# Patient Record
Sex: Female | Born: 1940
Health system: Southern US, Community
[De-identification: ages and names within clinical notes are randomized; demographics above are authoritative.]

## PROBLEM LIST (undated history)

## (undated) DIAGNOSIS — B0221 Postherpetic geniculate ganglionitis: Secondary | ICD-10-CM

## (undated) DIAGNOSIS — G511 Geniculate ganglionitis: Secondary | ICD-10-CM

## (undated) DIAGNOSIS — M1990 Unspecified osteoarthritis, unspecified site: Secondary | ICD-10-CM

## (undated) DIAGNOSIS — R011 Cardiac murmur, unspecified: Secondary | ICD-10-CM

## (undated) DIAGNOSIS — I639 Cerebral infarction, unspecified: Secondary | ICD-10-CM

## (undated) DIAGNOSIS — C801 Malignant (primary) neoplasm, unspecified: Secondary | ICD-10-CM

## (undated) DIAGNOSIS — I10 Essential (primary) hypertension: Secondary | ICD-10-CM

## (undated) DIAGNOSIS — E785 Hyperlipidemia, unspecified: Secondary | ICD-10-CM

## (undated) HISTORY — PX: TONSILLECTOMY: SUR1361

## (undated) HISTORY — DX: Cerebral infarction, unspecified: I63.9

## (undated) HISTORY — DX: Cardiac murmur, unspecified: R01.1

## (undated) HISTORY — DX: Hyperlipidemia, unspecified: E78.5

## (undated) HISTORY — PX: APPENDECTOMY: SHX54

## (undated) HISTORY — PX: FINGER SURGERY: SHX640

## (undated) HISTORY — PX: COLONOSCOPY: SHX174

## (undated) HISTORY — DX: Essential (primary) hypertension: I10

---

## 1963-03-20 HISTORY — PX: DILATION AND CURETTAGE OF UTERUS: SHX78

## 1972-03-19 HISTORY — PX: ABDOMINAL HYSTERECTOMY: SHX81

## 1985-03-19 DIAGNOSIS — I639 Cerebral infarction, unspecified: Secondary | ICD-10-CM

## 1985-03-19 DIAGNOSIS — D62 Acute posthemorrhagic anemia: Secondary | ICD-10-CM

## 1985-03-19 HISTORY — DX: Cerebral infarction, unspecified: I63.9

## 1998-12-12 ENCOUNTER — Other Ambulatory Visit: Admission: RE | Admit: 1998-12-12 | Discharge: 1998-12-12 | Payer: Self-pay | Admitting: Gynecology

## 2000-10-24 ENCOUNTER — Other Ambulatory Visit: Admission: RE | Admit: 2000-10-24 | Discharge: 2000-10-24 | Payer: Self-pay | Admitting: Gynecology

## 2001-12-04 ENCOUNTER — Other Ambulatory Visit: Admission: RE | Admit: 2001-12-04 | Discharge: 2001-12-04 | Payer: Self-pay | Admitting: Gynecology

## 2002-12-08 ENCOUNTER — Other Ambulatory Visit: Admission: RE | Admit: 2002-12-08 | Discharge: 2002-12-08 | Payer: Self-pay | Admitting: Gynecology

## 2003-04-01 ENCOUNTER — Emergency Department (HOSPITAL_COMMUNITY): Admission: AD | Admit: 2003-04-01 | Discharge: 2003-04-01 | Payer: Self-pay | Admitting: Family Medicine

## 2004-03-19 HISTORY — PX: EYE SURGERY: SHX253

## 2005-07-02 ENCOUNTER — Other Ambulatory Visit: Admission: RE | Admit: 2005-07-02 | Discharge: 2005-07-02 | Payer: Self-pay | Admitting: Family Medicine

## 2007-03-24 ENCOUNTER — Encounter: Admission: RE | Admit: 2007-03-24 | Discharge: 2007-03-24 | Payer: Self-pay | Admitting: Pediatrics

## 2007-07-21 ENCOUNTER — Other Ambulatory Visit: Admission: RE | Admit: 2007-07-21 | Discharge: 2007-07-21 | Payer: Self-pay | Admitting: Family Medicine

## 2008-01-21 ENCOUNTER — Ambulatory Visit (HOSPITAL_BASED_OUTPATIENT_CLINIC_OR_DEPARTMENT_OTHER): Admission: RE | Admit: 2008-01-21 | Discharge: 2008-01-21 | Payer: Self-pay | Admitting: Orthopedic Surgery

## 2008-01-21 ENCOUNTER — Encounter (INDEPENDENT_AMBULATORY_CARE_PROVIDER_SITE_OTHER): Payer: Self-pay | Admitting: Orthopedic Surgery

## 2010-08-01 NOTE — Op Note (Signed)
NAME:  Brittany Oliver, Brittany Oliver           ACCOUNT NO.:  [id number]   MEDICAL RECORD NO.:  [id number]          PATIENT TYPE:  AMB   LOCATION:  DSC                          FACILITY:  [facility]   PHYSICIAN:  Cindee Salt, M.D.       DATE OF BIRTH:  1941/02/15   DATE OF PROCEDURE:  DATE OF DISCHARGE:                               OPERATIVE REPORT   PREOPERATIVE DIAGNOSES:  Mucoid cyst, degenerative arthritis, right  middle finger, distal interphalangeal joint.   POSTOPERATIVE DIAGNOSES:  Mucoid cyst, degenerative arthritis, right  middle finger, distal interphalangeal joint.   OPERATION:  Excision mucoid cyst, debridement distal interphalangeal  joint with removal of loose body, right middle finger, DIP joint.   DATE OF OPERATION:  January 21, 2008.   SURGEON:  Cindee Salt, MD   ASSISTANT:  RN   ANESTHESIA:  Forearm based IV regional.   ANESTHESIOLOGIST:  Burna Forts, MD   HISTORY:  The patient is a 70 year old female with a history of a mass  of the distal interphalangeal joint with easily transillumination skin  with significant thinning, deformity of the nail plate, degenerative  arthritis with loose body in the distal interphalangeal joint.  She has  elected to proceed to have this surgically excised with the joint  debrided.  She is aware of risks and complications including infection,  recurrence, injury to arteries, nerves, tendons, incomplete relief of  symptoms, and dystrophy.  In the preoperative area, the patient is seen.  The extremity marked by both the patient and surgeon.  Antibiotic given.   PROCEDURE:  The patient was brought to the operating room where forearm  based IV regional anesthetic was carried out without difficulty.  She  was prepped using DuraPrep, supine position, right arm free.  A time-out  was taken.  A curvilinear incision was made over the distal  interphalangeal joint, carried down along the middle phalanx, carried  down through subcutaneous tissue.   Bleeders were electrocauterized.  A  large cyst was immediately encountered distally with a protrusion  through the tendon distally with a large exostosis proceeding through  the tendon defect.  The cyst was excised, sent to pathology.  The joint  was then opened on its radial aspect.  The loose body was removed  without difficulty.  A synovectomy performed.  Debridement of the joint  performed.  An incision was made about the exostosis penetrating through  the extensor tendon.  This was removed with a small rongeur and sent to  pathology.  No further lesions were identified.  The wound was copiously  irrigated with saline.  The skin was  then closed with interrupted 5-0 Vicryl Rapide sutures.  Sterile  compressive dressing and splint to the finger was applied.  The patient  tolerated the procedure well, was taken to the recovery room for  observation in satisfactory condition.  She will be discharged home to  return to the [facility] in [place] in 1 week on Vicodin.           ______________________________  Cindee Salt, M.D.     GK/MEDQ  D:  01/21/2008  T:  01/22/2008  Job:  [id number]

## 2010-12-20 LAB — BASIC METABOLIC PANEL
BUN: 18
CO2: 31
Calcium: 9.7
Chloride: 102
Creatinine, Ser: 0.77
GFR calc Af Amer: >60
GFR calc non Af Amer: >60
Glucose, Bld: 131 — ABNORMAL HIGH
Potassium: 4.2
Sodium: 141

## 2010-12-20 LAB — POCT HEMOGLOBIN-HEMACUE: Hemoglobin: 12.7

## 2011-03-23 DIAGNOSIS — M069 Rheumatoid arthritis, unspecified: Secondary | ICD-10-CM | POA: Diagnosis not present

## 2011-03-23 DIAGNOSIS — T887XXA Unspecified adverse effect of drug or medicament, initial encounter: Secondary | ICD-10-CM | POA: Diagnosis not present

## 2011-03-23 DIAGNOSIS — Z79899 Other long term (current) drug therapy: Secondary | ICD-10-CM | POA: Diagnosis not present

## 2011-04-30 DIAGNOSIS — T887XXA Unspecified adverse effect of drug or medicament, initial encounter: Secondary | ICD-10-CM | POA: Diagnosis not present

## 2011-04-30 DIAGNOSIS — M069 Rheumatoid arthritis, unspecified: Secondary | ICD-10-CM | POA: Diagnosis not present

## 2011-04-30 DIAGNOSIS — Z79899 Other long term (current) drug therapy: Secondary | ICD-10-CM | POA: Diagnosis not present

## 2011-05-09 DIAGNOSIS — Z961 Presence of intraocular lens: Secondary | ICD-10-CM | POA: Diagnosis not present

## 2011-05-09 DIAGNOSIS — H35439 Paving stone degeneration of retina, unspecified eye: Secondary | ICD-10-CM | POA: Diagnosis not present

## 2011-05-10 DIAGNOSIS — L570 Actinic keratosis: Secondary | ICD-10-CM | POA: Diagnosis not present

## 2011-05-10 DIAGNOSIS — L821 Other seborrheic keratosis: Secondary | ICD-10-CM | POA: Diagnosis not present

## 2011-06-14 DIAGNOSIS — Z961 Presence of intraocular lens: Secondary | ICD-10-CM | POA: Diagnosis not present

## 2011-06-14 DIAGNOSIS — M069 Rheumatoid arthritis, unspecified: Secondary | ICD-10-CM | POA: Diagnosis not present

## 2011-06-14 DIAGNOSIS — T887XXA Unspecified adverse effect of drug or medicament, initial encounter: Secondary | ICD-10-CM | POA: Diagnosis not present

## 2011-06-14 DIAGNOSIS — Z79899 Other long term (current) drug therapy: Secondary | ICD-10-CM | POA: Diagnosis not present

## 2011-06-14 DIAGNOSIS — H35349 Macular cyst, hole, or pseudohole, unspecified eye: Secondary | ICD-10-CM | POA: Diagnosis not present

## 2011-06-14 DIAGNOSIS — H5315 Visual distortions of shape and size: Secondary | ICD-10-CM | POA: Diagnosis not present

## 2011-06-26 DIAGNOSIS — H43819 Vitreous degeneration, unspecified eye: Secondary | ICD-10-CM | POA: Diagnosis not present

## 2011-06-26 DIAGNOSIS — H35349 Macular cyst, hole, or pseudohole, unspecified eye: Secondary | ICD-10-CM | POA: Diagnosis not present

## 2011-07-03 DIAGNOSIS — H35349 Macular cyst, hole, or pseudohole, unspecified eye: Secondary | ICD-10-CM | POA: Diagnosis not present

## 2011-08-01 DIAGNOSIS — M069 Rheumatoid arthritis, unspecified: Secondary | ICD-10-CM | POA: Diagnosis not present

## 2011-08-01 DIAGNOSIS — Z79899 Other long term (current) drug therapy: Secondary | ICD-10-CM | POA: Diagnosis not present

## 2011-08-01 DIAGNOSIS — T887XXA Unspecified adverse effect of drug or medicament, initial encounter: Secondary | ICD-10-CM | POA: Diagnosis not present

## 2011-08-06 DIAGNOSIS — Z1331 Encounter for screening for depression: Secondary | ICD-10-CM | POA: Diagnosis not present

## 2011-08-06 DIAGNOSIS — I10 Essential (primary) hypertension: Secondary | ICD-10-CM | POA: Diagnosis not present

## 2011-08-06 DIAGNOSIS — E785 Hyperlipidemia, unspecified: Secondary | ICD-10-CM | POA: Diagnosis not present

## 2011-08-06 DIAGNOSIS — M76899 Other specified enthesopathies of unspecified lower limb, excluding foot: Secondary | ICD-10-CM | POA: Diagnosis not present

## 2011-08-06 DIAGNOSIS — G500 Trigeminal neuralgia: Secondary | ICD-10-CM | POA: Diagnosis not present

## 2011-08-06 DIAGNOSIS — R7301 Impaired fasting glucose: Secondary | ICD-10-CM | POA: Diagnosis not present

## 2011-08-20 DIAGNOSIS — H9190 Unspecified hearing loss, unspecified ear: Secondary | ICD-10-CM | POA: Diagnosis not present

## 2011-08-20 DIAGNOSIS — M76899 Other specified enthesopathies of unspecified lower limb, excluding foot: Secondary | ICD-10-CM | POA: Diagnosis not present

## 2011-08-20 DIAGNOSIS — H6120 Impacted cerumen, unspecified ear: Secondary | ICD-10-CM | POA: Diagnosis not present

## 2011-08-29 DIAGNOSIS — M810 Age-related osteoporosis without current pathological fracture: Secondary | ICD-10-CM | POA: Diagnosis not present

## 2011-08-29 DIAGNOSIS — Z79899 Other long term (current) drug therapy: Secondary | ICD-10-CM | POA: Diagnosis not present

## 2011-08-29 DIAGNOSIS — M069 Rheumatoid arthritis, unspecified: Secondary | ICD-10-CM | POA: Diagnosis not present

## 2011-08-29 DIAGNOSIS — E782 Mixed hyperlipidemia: Secondary | ICD-10-CM | POA: Diagnosis not present

## 2011-09-12 DIAGNOSIS — T887XXA Unspecified adverse effect of drug or medicament, initial encounter: Secondary | ICD-10-CM | POA: Diagnosis not present

## 2011-09-12 DIAGNOSIS — Z79899 Other long term (current) drug therapy: Secondary | ICD-10-CM | POA: Diagnosis not present

## 2011-09-12 DIAGNOSIS — M069 Rheumatoid arthritis, unspecified: Secondary | ICD-10-CM | POA: Diagnosis not present

## 2011-09-13 DIAGNOSIS — H35349 Macular cyst, hole, or pseudohole, unspecified eye: Secondary | ICD-10-CM | POA: Diagnosis not present

## 2011-09-18 DIAGNOSIS — Z1231 Encounter for screening mammogram for malignant neoplasm of breast: Secondary | ICD-10-CM | POA: Diagnosis not present

## 2011-09-28 DIAGNOSIS — M76899 Other specified enthesopathies of unspecified lower limb, excluding foot: Secondary | ICD-10-CM | POA: Diagnosis not present

## 2011-10-24 DIAGNOSIS — M76899 Other specified enthesopathies of unspecified lower limb, excluding foot: Secondary | ICD-10-CM | POA: Diagnosis not present

## 2011-11-02 DIAGNOSIS — T887XXA Unspecified adverse effect of drug or medicament, initial encounter: Secondary | ICD-10-CM | POA: Diagnosis not present

## 2011-11-02 DIAGNOSIS — M069 Rheumatoid arthritis, unspecified: Secondary | ICD-10-CM | POA: Diagnosis not present

## 2011-11-02 DIAGNOSIS — Z79899 Other long term (current) drug therapy: Secondary | ICD-10-CM | POA: Diagnosis not present

## 2011-11-14 DIAGNOSIS — M25559 Pain in unspecified hip: Secondary | ICD-10-CM | POA: Diagnosis not present

## 2011-11-14 DIAGNOSIS — M76899 Other specified enthesopathies of unspecified lower limb, excluding foot: Secondary | ICD-10-CM | POA: Diagnosis not present

## 2011-11-22 DIAGNOSIS — M25559 Pain in unspecified hip: Secondary | ICD-10-CM | POA: Diagnosis not present

## 2011-11-29 DIAGNOSIS — M25559 Pain in unspecified hip: Secondary | ICD-10-CM | POA: Diagnosis not present

## 2011-12-13 DIAGNOSIS — T887XXA Unspecified adverse effect of drug or medicament, initial encounter: Secondary | ICD-10-CM | POA: Diagnosis not present

## 2011-12-13 DIAGNOSIS — M069 Rheumatoid arthritis, unspecified: Secondary | ICD-10-CM | POA: Diagnosis not present

## 2011-12-13 DIAGNOSIS — Z79899 Other long term (current) drug therapy: Secondary | ICD-10-CM | POA: Diagnosis not present

## 2011-12-20 DIAGNOSIS — H35379 Puckering of macula, unspecified eye: Secondary | ICD-10-CM | POA: Diagnosis not present

## 2012-01-24 DIAGNOSIS — Z79899 Other long term (current) drug therapy: Secondary | ICD-10-CM | POA: Diagnosis not present

## 2012-01-24 DIAGNOSIS — M069 Rheumatoid arthritis, unspecified: Secondary | ICD-10-CM | POA: Diagnosis not present

## 2012-01-24 DIAGNOSIS — T887XXA Unspecified adverse effect of drug or medicament, initial encounter: Secondary | ICD-10-CM | POA: Diagnosis not present

## 2012-02-05 DIAGNOSIS — E785 Hyperlipidemia, unspecified: Secondary | ICD-10-CM | POA: Diagnosis not present

## 2012-02-05 DIAGNOSIS — Z23 Encounter for immunization: Secondary | ICD-10-CM | POA: Diagnosis not present

## 2012-02-05 DIAGNOSIS — M5137 Other intervertebral disc degeneration, lumbosacral region: Secondary | ICD-10-CM | POA: Diagnosis not present

## 2012-02-05 DIAGNOSIS — I10 Essential (primary) hypertension: Secondary | ICD-10-CM | POA: Diagnosis not present

## 2012-02-12 DIAGNOSIS — M545 Low back pain, unspecified: Secondary | ICD-10-CM | POA: Diagnosis not present

## 2012-02-21 DIAGNOSIS — M545 Low back pain, unspecified: Secondary | ICD-10-CM | POA: Diagnosis not present

## 2012-02-26 DIAGNOSIS — M545 Low back pain, unspecified: Secondary | ICD-10-CM | POA: Diagnosis not present

## 2012-02-27 DIAGNOSIS — M069 Rheumatoid arthritis, unspecified: Secondary | ICD-10-CM | POA: Diagnosis not present

## 2012-02-27 DIAGNOSIS — Z79899 Other long term (current) drug therapy: Secondary | ICD-10-CM | POA: Diagnosis not present

## 2012-02-27 DIAGNOSIS — I10 Essential (primary) hypertension: Secondary | ICD-10-CM | POA: Diagnosis not present

## 2012-02-27 DIAGNOSIS — E782 Mixed hyperlipidemia: Secondary | ICD-10-CM | POA: Diagnosis not present

## 2012-02-28 DIAGNOSIS — M545 Low back pain, unspecified: Secondary | ICD-10-CM | POA: Diagnosis not present

## 2012-03-03 DIAGNOSIS — M545 Low back pain, unspecified: Secondary | ICD-10-CM | POA: Diagnosis not present

## 2012-03-05 DIAGNOSIS — T887XXA Unspecified adverse effect of drug or medicament, initial encounter: Secondary | ICD-10-CM | POA: Diagnosis not present

## 2012-03-05 DIAGNOSIS — M069 Rheumatoid arthritis, unspecified: Secondary | ICD-10-CM | POA: Diagnosis not present

## 2012-03-05 DIAGNOSIS — Z79899 Other long term (current) drug therapy: Secondary | ICD-10-CM | POA: Diagnosis not present

## 2012-03-06 DIAGNOSIS — M545 Low back pain, unspecified: Secondary | ICD-10-CM | POA: Diagnosis not present

## 2012-03-10 DIAGNOSIS — M545 Low back pain, unspecified: Secondary | ICD-10-CM | POA: Diagnosis not present

## 2012-04-30 DIAGNOSIS — Z79899 Other long term (current) drug therapy: Secondary | ICD-10-CM | POA: Diagnosis not present

## 2012-04-30 DIAGNOSIS — T887XXA Unspecified adverse effect of drug or medicament, initial encounter: Secondary | ICD-10-CM | POA: Diagnosis not present

## 2012-04-30 DIAGNOSIS — M069 Rheumatoid arthritis, unspecified: Secondary | ICD-10-CM | POA: Diagnosis not present

## 2012-06-04 DIAGNOSIS — T887XXA Unspecified adverse effect of drug or medicament, initial encounter: Secondary | ICD-10-CM | POA: Diagnosis not present

## 2012-06-04 DIAGNOSIS — M069 Rheumatoid arthritis, unspecified: Secondary | ICD-10-CM | POA: Diagnosis not present

## 2012-06-04 DIAGNOSIS — Z79899 Other long term (current) drug therapy: Secondary | ICD-10-CM | POA: Diagnosis not present

## 2012-06-19 DIAGNOSIS — H5315 Visual distortions of shape and size: Secondary | ICD-10-CM | POA: Diagnosis not present

## 2012-06-19 DIAGNOSIS — H2640 Unspecified secondary cataract: Secondary | ICD-10-CM | POA: Diagnosis not present

## 2012-06-19 DIAGNOSIS — H5310 Unspecified subjective visual disturbances: Secondary | ICD-10-CM | POA: Diagnosis not present

## 2012-06-19 DIAGNOSIS — H35379 Puckering of macula, unspecified eye: Secondary | ICD-10-CM | POA: Diagnosis not present

## 2012-07-03 DIAGNOSIS — H26499 Other secondary cataract, unspecified eye: Secondary | ICD-10-CM | POA: Diagnosis not present

## 2012-07-03 DIAGNOSIS — H2640 Unspecified secondary cataract: Secondary | ICD-10-CM | POA: Diagnosis not present

## 2012-07-16 DIAGNOSIS — Z79899 Other long term (current) drug therapy: Secondary | ICD-10-CM | POA: Diagnosis not present

## 2012-07-16 DIAGNOSIS — T887XXA Unspecified adverse effect of drug or medicament, initial encounter: Secondary | ICD-10-CM | POA: Diagnosis not present

## 2012-07-16 DIAGNOSIS — M069 Rheumatoid arthritis, unspecified: Secondary | ICD-10-CM | POA: Diagnosis not present

## 2012-08-25 DIAGNOSIS — I10 Essential (primary) hypertension: Secondary | ICD-10-CM | POA: Diagnosis not present

## 2012-08-25 DIAGNOSIS — IMO0002 Reserved for concepts with insufficient information to code with codable children: Secondary | ICD-10-CM | POA: Diagnosis not present

## 2012-08-25 DIAGNOSIS — E785 Hyperlipidemia, unspecified: Secondary | ICD-10-CM | POA: Diagnosis not present

## 2012-08-25 DIAGNOSIS — M545 Low back pain, unspecified: Secondary | ICD-10-CM | POA: Diagnosis not present

## 2012-08-25 DIAGNOSIS — R7301 Impaired fasting glucose: Secondary | ICD-10-CM | POA: Diagnosis not present

## 2012-08-25 DIAGNOSIS — Z1331 Encounter for screening for depression: Secondary | ICD-10-CM | POA: Diagnosis not present

## 2012-08-27 DIAGNOSIS — M810 Age-related osteoporosis without current pathological fracture: Secondary | ICD-10-CM | POA: Diagnosis not present

## 2012-08-27 DIAGNOSIS — I10 Essential (primary) hypertension: Secondary | ICD-10-CM | POA: Diagnosis not present

## 2012-08-27 DIAGNOSIS — M069 Rheumatoid arthritis, unspecified: Secondary | ICD-10-CM | POA: Diagnosis not present

## 2012-08-27 DIAGNOSIS — M2540 Effusion, unspecified joint: Secondary | ICD-10-CM | POA: Diagnosis not present

## 2012-08-27 DIAGNOSIS — E782 Mixed hyperlipidemia: Secondary | ICD-10-CM | POA: Diagnosis not present

## 2012-08-27 DIAGNOSIS — T887XXA Unspecified adverse effect of drug or medicament, initial encounter: Secondary | ICD-10-CM | POA: Diagnosis not present

## 2012-08-27 DIAGNOSIS — Z79899 Other long term (current) drug therapy: Secondary | ICD-10-CM | POA: Diagnosis not present

## 2012-09-24 DIAGNOSIS — Z1231 Encounter for screening mammogram for malignant neoplasm of breast: Secondary | ICD-10-CM | POA: Diagnosis not present

## 2012-09-24 DIAGNOSIS — Z780 Asymptomatic menopausal state: Secondary | ICD-10-CM | POA: Diagnosis not present

## 2012-10-08 DIAGNOSIS — T887XXA Unspecified adverse effect of drug or medicament, initial encounter: Secondary | ICD-10-CM | POA: Diagnosis not present

## 2012-10-08 DIAGNOSIS — M069 Rheumatoid arthritis, unspecified: Secondary | ICD-10-CM | POA: Diagnosis not present

## 2012-10-08 DIAGNOSIS — Z79899 Other long term (current) drug therapy: Secondary | ICD-10-CM | POA: Diagnosis not present

## 2012-11-20 DIAGNOSIS — M069 Rheumatoid arthritis, unspecified: Secondary | ICD-10-CM | POA: Diagnosis not present

## 2012-11-20 DIAGNOSIS — T887XXA Unspecified adverse effect of drug or medicament, initial encounter: Secondary | ICD-10-CM | POA: Diagnosis not present

## 2012-11-20 DIAGNOSIS — Z79899 Other long term (current) drug therapy: Secondary | ICD-10-CM | POA: Diagnosis not present

## 2012-12-26 DIAGNOSIS — Z23 Encounter for immunization: Secondary | ICD-10-CM | POA: Diagnosis not present

## 2012-12-31 DIAGNOSIS — Z79899 Other long term (current) drug therapy: Secondary | ICD-10-CM | POA: Diagnosis not present

## 2012-12-31 DIAGNOSIS — T887XXA Unspecified adverse effect of drug or medicament, initial encounter: Secondary | ICD-10-CM | POA: Diagnosis not present

## 2012-12-31 DIAGNOSIS — M069 Rheumatoid arthritis, unspecified: Secondary | ICD-10-CM | POA: Diagnosis not present

## 2013-02-16 DIAGNOSIS — C801 Malignant (primary) neoplasm, unspecified: Secondary | ICD-10-CM

## 2013-02-16 HISTORY — DX: Malignant (primary) neoplasm, unspecified: C80.1

## 2013-02-18 DIAGNOSIS — M069 Rheumatoid arthritis, unspecified: Secondary | ICD-10-CM | POA: Diagnosis not present

## 2013-02-18 DIAGNOSIS — T887XXA Unspecified adverse effect of drug or medicament, initial encounter: Secondary | ICD-10-CM | POA: Diagnosis not present

## 2013-02-18 DIAGNOSIS — Z79899 Other long term (current) drug therapy: Secondary | ICD-10-CM | POA: Diagnosis not present

## 2013-02-20 DIAGNOSIS — I10 Essential (primary) hypertension: Secondary | ICD-10-CM | POA: Diagnosis not present

## 2013-02-20 DIAGNOSIS — E785 Hyperlipidemia, unspecified: Secondary | ICD-10-CM | POA: Diagnosis not present

## 2013-02-20 DIAGNOSIS — R7309 Other abnormal glucose: Secondary | ICD-10-CM | POA: Diagnosis not present

## 2013-02-20 DIAGNOSIS — G500 Trigeminal neuralgia: Secondary | ICD-10-CM | POA: Diagnosis not present

## 2013-02-26 DIAGNOSIS — D485 Neoplasm of uncertain behavior of skin: Secondary | ICD-10-CM | POA: Diagnosis not present

## 2013-02-26 DIAGNOSIS — L218 Other seborrheic dermatitis: Secondary | ICD-10-CM | POA: Diagnosis not present

## 2013-02-26 DIAGNOSIS — C4360 Malignant melanoma of unspecified upper limb, including shoulder: Secondary | ICD-10-CM | POA: Diagnosis not present

## 2013-02-26 DIAGNOSIS — L570 Actinic keratosis: Secondary | ICD-10-CM | POA: Diagnosis not present

## 2013-03-23 ENCOUNTER — Encounter (INDEPENDENT_AMBULATORY_CARE_PROVIDER_SITE_OTHER): Payer: Self-pay | Admitting: General Surgery

## 2013-03-23 ENCOUNTER — Encounter (HOSPITAL_COMMUNITY): Payer: Self-pay | Admitting: Respiratory Therapy

## 2013-03-23 ENCOUNTER — Ambulatory Visit (INDEPENDENT_AMBULATORY_CARE_PROVIDER_SITE_OTHER): Payer: Medicare Other | Admitting: General Surgery

## 2013-03-23 VITALS — BP 120/80 | HR 78 | Temp 98.6°F | Resp 14 | Ht 64.0 in | Wt 132.4 lb

## 2013-03-23 DIAGNOSIS — C4360 Malignant melanoma of unspecified upper limb, including shoulder: Secondary | ICD-10-CM

## 2013-03-23 DIAGNOSIS — C4362 Malignant melanoma of left upper limb, including shoulder: Secondary | ICD-10-CM | POA: Insufficient documentation

## 2013-03-23 NOTE — Progress Notes (Signed)
Patient ID: Brittany Oliver, female   DOB: May 23, 1940, 73 y.o.   MRN: [id number]  Chief Complaint  Patient presents with  . Mass    HPI Brittany Oliver is a 73 y.o. female.  Chief complaint: Melanoma left upper arm HPI Patient was evaluated by dermatology and skin surgery Center. Dr. Willaim Sheng performed a shave biopsy on her left upper arm on the medial aspect near her axilla. Pathology returned malignant melanoma, 0.79 mm in thickness. There was no satellitosis, ulceration was focally present. The margin was negative, however, in situ component extended to a lateral margin. This is consistent with a pT1b lesion. I was asked to see her in consultation by Dr. Willaim Sheng regarding further treatment. The patient has no significant complaints from the biopsy site. Her daughter is a Sales promotion account executive who works at [employer]. Past Medical History  Diagnosis Date  . Hypertension   . Heart murmur   . Hyperlipidemia   . Stroke     Past Surgical History  Procedure Laterality Date  . Abdominal hysterectomy      History reviewed. No pertinent family history.  Social History History  Substance Use Topics  . Smoking status: Former Smoker    Quit date: 03/23/2005  . Smokeless tobacco: Not on file  . Alcohol Use: 1.2 oz/week    2 Glasses of wine per week    No Known Allergies  Current Outpatient Prescriptions  Medication Sig Dispense Refill  . amoxicillin (AMOXIL) 500 MG capsule       . Betamethasone Valerate 0.12 % foam       . gabapentin (NEURONTIN) 800 MG tablet       . losartan-hydrochlorothiazide (HYZAAR) 100-25 MG per tablet       . pravastatin (PRAVACHOL) 80 MG tablet       . vitamin C (ASCORBIC ACID) 500 MG tablet Take 500 mg by mouth daily.       No current facility-administered medications for this visit.    Review of Systems Review of Systems  Constitutional: Negative for fever, chills and unexpected weight change.  HENT: Negative for congestion, hearing loss, sore  throat, trouble swallowing and voice change.        Chronic left facial neuralgia  Eyes: Negative for visual disturbance.  Respiratory: Negative for cough and wheezing.   Cardiovascular: Negative for chest pain, palpitations and leg swelling.  Gastrointestinal: Negative for nausea, vomiting, abdominal pain, diarrhea, constipation, blood in stool, abdominal distention and anal bleeding.  Genitourinary: Negative for hematuria, vaginal bleeding and difficulty urinating.  Musculoskeletal: Negative for arthralgias.  Skin: Positive for wound. Negative for rash.       Status post biopsy left upper arm  Neurological: Negative for seizures, syncope and headaches.       See above  Hematological: Negative for adenopathy. Does not bruise/bleed easily.  Psychiatric/Behavioral: Negative for confusion.    Blood pressure 120/80, pulse 78, temperature 98.6 F (37 C), temperature source Temporal, resp. rate 14, height 5\' 4"  (1.626 m), weight 132 lb 6.4 oz (60.056 kg).  Physical Exam Physical Exam  Constitutional: She is oriented to person, place, and time. She appears well-developed and well-nourished. No distress.  HENT:  Head: Normocephalic.  Mouth/Throat: Oropharynx is clear and moist.  Eyes: Conjunctivae and EOM are normal. Pupils are equal, round, and reactive to light. Right eye exhibits no discharge. Left eye exhibits no discharge.  Neck: Normal range of motion. No tracheal deviation present. No thyromegaly present.  Cardiovascular: Normal rate, regular rhythm, normal  heart sounds and intact distal pulses.   Pulmonary/Chest: Effort normal and breath sounds normal. No stridor. No respiratory distress. She has no wheezes. She has no rales.  Abdominal: Soft. Bowel sounds are normal. She exhibits no distension. There is no tenderness.  Musculoskeletal: She exhibits no edema and no tenderness.       Arms: Healing biopsy site left upper inner arm with no surrounding nodularity, no signs of infection    Neurological: She is alert and oriented to person, place, and time.  Skin:  See Musculoskeletal section    Data Reviewed Pathology as described above  Assessment    Melanoma left upper inner arm.    Plan    In light of the thickness, I recommend wide excision as well as sentinel lymph node biopsy. We discussed the risks and benefits as well as some general possibilities of pathologic outcome. We'll plan oncologic referral after surgery as well, and her daughter is a patient of Dr. Marin Olp who [place] would like to see. I look forward to schediling  this in the very near future as an outpatient procedure. Their  questions were answered.       Anahita Cua E 03/23/2013, 1:19 PM

## 2013-03-24 DIAGNOSIS — Z0289 Encounter for other administrative examinations: Secondary | ICD-10-CM

## 2013-03-25 ENCOUNTER — Encounter: Payer: Self-pay | Admitting: Neurology

## 2013-03-25 ENCOUNTER — Ambulatory Visit (INDEPENDENT_AMBULATORY_CARE_PROVIDER_SITE_OTHER): Payer: Medicare Other | Admitting: Neurology

## 2013-03-25 VITALS — BP 124/90 | HR 80 | Resp 14 | Ht 63.5 in | Wt 135.0 lb

## 2013-03-25 DIAGNOSIS — G500 Trigeminal neuralgia: Secondary | ICD-10-CM

## 2013-03-25 DIAGNOSIS — G501 Atypical facial pain: Secondary | ICD-10-CM | POA: Diagnosis not present

## 2013-03-25 MED ORDER — OXCARBAZEPINE 150 MG PO TABS: 150.0000 mg | ORAL_TABLET | Freq: Two times a day (BID) | ORAL | Status: DC

## 2013-03-25 NOTE — Patient Instructions (Addendum)
It is an atypical presentation of trigeminal neuralgia, but it certainly is a neuralgia. 1.  I would like to start Trileptal (oxcarbazepine) 150mg  pills.  Take 1 pill twice daily (1 in morning and 1 at bedtime).  Side effects may cause sleepiness or dizziness.  Call in 4 weeks with an update.  If you are feeling better, then we will refill the medication.  Otherwise, we will increase the dose. 2.  Continue the gabapentin at current dose in the meantime. 3.  Follow up in 3 months.

## 2013-03-25 NOTE — Progress Notes (Signed)
NEUROLOGY CONSULTATION NOTE  Brittany Oliver MRN: [id number] DOB: December 18, 1940  Referring provider: Dr. Dema Severin  Primary care provider: Dr. Dema Severin  Reason for consult:  Trigeminal neuralgia  HISTORY OF PRESENT ILLNESS: Brittany Oliver is a 73 year old right-handed woman with history of posterior circulation stroke, hypertension, and hypercholesterolemia who presents for trigeminal neuralgia.  Records and images were personally reviewed where available.    She began experiencing left-sided facial pain in 2006.  She reports a fairly constant burning and pins and needles sensation in the left V1 and V2 distribution.  Light touch, cold and wind can trigger a shooting pain down the face, but there is no spontaneous paroxysmal shooting pain. Eating and brushing her teeth does not aggravate it.  She says heat seems to help relieve it.  She was eventually diagnosed with left trigeminal neuralgia in 2012.  Current Medication:  gabapentin 800mg  TID (helps but still lingering pain) Past medications:  none Other therapy:  Gamma Knife x2 (ineffective).  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  She does have a history of brainstem stroke in 1987, in which she presented with left oculomotor weakness, left rotatory nystagmus, left facial numbness and dysfunction of her right arm and leg.  She still has residual numbness of the left face.  08/25/12 LABS:  Na 138, K 3.8, Cl 97, GLUC 104, BUN 22, Cr 0.84, Ca 9,8, AP 50, AST 17, ALT 17, Hgb A1c 5.8.  PAST MEDICAL HISTORY: Past Medical History  Diagnosis Date  . Hypertension   . Heart murmur   . Hyperlipidemia   . Stroke     PAST SURGICAL HISTORY: Past Surgical History  Procedure Laterality Date  . Abdominal hysterectomy  1974    MEDICATIONS: Current Outpatient  Prescriptions on File Prior to Visit  Medication Sig Dispense Refill  . amoxicillin (AMOXIL) 500 MG capsule Take 500 mg by mouth 4 (four) times daily.      . Betamethasone Valerate 0.12 % foam Apply 1 application topically daily.       . diclofenac (FLECTOR) 1.3 % PTCH Place 1 patch onto the skin daily as needed (for pain).      Marland Kitchen gabapentin (NEURONTIN) 800 MG tablet Take 800 mg by mouth 3 (three) times daily.      Marland Kitchen losartan-hydrochlorothiazide (HYZAAR) 100-25 MG per tablet Take 1 tablet by mouth daily.      . pravastatin (PRAVACHOL) 80 MG tablet Take 80 mg by mouth daily.      . vitamin C (ASCORBIC ACID) 500 MG tablet Take 500 mg by mouth daily.       No current facility-administered medications on file prior to visit.    ALLERGIES: No Known Allergies  FAMILY HISTORY: Family History  Problem Relation Age of Onset  . Adopted: Yes    SOCIAL HISTORY: History   Social History  . Marital Status: Married    Spouse Name: N/A    Number of Children: N/A  . Years of Education: N/A   Occupational History  . Not on file.   Social History Main Topics  . Smoking status: Former Smoker    Quit date: 03/23/2005  . Smokeless tobacco: Not on file  . Alcohol Use: 1.2 oz/week    2 Glasses of wine per week  . Drug Use: No  . Sexual Activity: Not on file  Other Topics Concern  . Not on file   Social History Narrative  . No narrative on file    REVIEW OF SYSTEMS: Constitutional: No fevers, chills, or sweats, no generalized fatigue, change in appetite Eyes: No visual changes, double vision, eye pain Ear, nose and throat: No hearing loss, ear pain, nasal congestion, sore throat Cardiovascular: No chest pain, palpitations Respiratory:  No shortness of breath at rest or with exertion, wheezes GastrointestinaI: No nausea, vomiting, diarrhea, abdominal pain, fecal incontinence Genitourinary:  No dysuria, urinary retention or frequency Musculoskeletal:  No neck pain, back  pain Integumentary: No rash, pruritus, skin lesions Neurological: as above Psychiatric: No depression, insomnia, anxiety Endocrine: No palpitations, fatigue, diaphoresis, mood swings, change in appetite, change in weight, increased thirst Hematologic/Lymphatic:  No anemia, purpura, petechiae. Allergic/Immunologic: no itchy/runny eyes, nasal congestion, recent allergic reactions, rashes  PHYSICAL EXAM: Filed Vitals:   03/25/13 1342  BP: 124/90  Pulse: 80  Resp: 14   General: No acute distress Head:  Normocephalic/atraumatic Neck: supple, no paraspinal tenderness, full range of motion Back: No paraspinal tenderness Heart: regular rate and rhythm Lungs: Clear to auscultation bilaterally. Vascular: No carotid bruits. Neurological Exam: Mental status: alert and oriented to person, place, and time, speech fluent and not dysarthric, language intact. Cranial nerves: CN I: not tested CN II: pupils equal, round and reactive to light, visual fields intact, fundi unremarkable. CN III, IV, VI:  full range of motion, no nystagmus, no ptosis CN V: reduced sensation of left V1-V2 distribution CN VII: upper and lower face symmetric CN VIII: hearing intact CN IX, X: gag intact, uvula midline CN XI: sternocleidomastoid and trapezius muscles intact CN XII: tongue midline Bulk & Tone: normal, no fasciculations. Motor: 5/5 throughout Sensation: temperature and vibration intact. Deep Tendon Reflexes: 2+ throughout, toes down Finger to nose testing: mild postural and kinetic tremor, no dysmetria Gait: normal stance and stride.  Some difficulty with tandem walking. Romberg negative.  IMPRESSION: Left-sided atypical facial pain.  It is a neuralgia involving the trigeminal nerve, but presentation not classic.  PLAN: 1.  We will try Trileptal 150mg  BID.  Side effects discussed.  We will see how she does on this dose and will get an update in 4 weeks. 2.  In the meantime, she will remain on  gabapentin 800mg  TID while we titrate the Trileptal up to see if we achieve any resolution of pain. 3.  She is scheduled for blood work tomorrow as pre-op for excision of melanoma.  Will see if this includes CBC and BMP 4.  Follow up in 3 months.  Thank you for allowing me to take part in the care of this patient.  Metta Clines, DO  CC:  [name], MD

## 2013-03-26 ENCOUNTER — Encounter (HOSPITAL_COMMUNITY)
Admission: RE | Admit: 2013-03-26 | Discharge: 2013-03-26 | Disposition: A | Discharge status: Home / Self Care | Payer: Medicare Other | Source: Ambulatory Visit | Attending: General Surgery | Admitting: General Surgery

## 2013-03-26 ENCOUNTER — Encounter (HOSPITAL_COMMUNITY): Payer: Self-pay

## 2013-03-26 ENCOUNTER — Other Ambulatory Visit (HOSPITAL_COMMUNITY): Payer: Self-pay | Admitting: *Deleted

## 2013-03-26 DIAGNOSIS — Z87891 Personal history of nicotine dependence: Secondary | ICD-10-CM | POA: Diagnosis not present

## 2013-03-26 DIAGNOSIS — Z8673 Personal history of transient ischemic attack (TIA), and cerebral infarction without residual deficits: Secondary | ICD-10-CM | POA: Diagnosis not present

## 2013-03-26 DIAGNOSIS — R918 Other nonspecific abnormal finding of lung field: Secondary | ICD-10-CM | POA: Diagnosis not present

## 2013-03-26 DIAGNOSIS — I10 Essential (primary) hypertension: Secondary | ICD-10-CM | POA: Diagnosis not present

## 2013-03-26 DIAGNOSIS — C4360 Malignant melanoma of unspecified upper limb, including shoulder: Secondary | ICD-10-CM | POA: Diagnosis not present

## 2013-03-26 HISTORY — DX: Unspecified osteoarthritis, unspecified site: M19.90

## 2013-03-26 HISTORY — DX: Malignant (primary) neoplasm, unspecified: C80.1

## 2013-03-26 HISTORY — DX: Postherpetic geniculate ganglionitis: B02.21

## 2013-03-26 HISTORY — DX: Geniculate ganglionitis: G51.1

## 2013-03-26 LAB — BASIC METABOLIC PANEL
BUN: 16 mg/dL (ref 6–23)
CO2: 31 mEq/L (ref 19–32)
Calcium: 9.5 mg/dL (ref 8.4–10.5)
Chloride: 94 mEq/L — ABNORMAL LOW (ref 96–112)
Creatinine, Ser: 0.69 mg/dL (ref 0.50–1.10)
GFR calc Af Amer: >90 mL/min (ref >90)
GFR calc non Af Amer: 86 mL/min — ABNORMAL LOW (ref >90)
Glucose, Bld: 124 mg/dL — ABNORMAL HIGH (ref 70–99)
Potassium: 4.4 mEq/L (ref 3.7–5.3)
Sodium: 137 mEq/L (ref 137–147)

## 2013-03-26 LAB — CBC
HCT: 40.2 % (ref 36.0–46.0)
Hemoglobin: 13.3 g/dL (ref 12.0–15.0)
MCH: 31.5 pg (ref 26.0–34.0)
MCHC: 33.1 g/dL (ref 30.0–36.0)
MCV: 95.3 fL (ref 78.0–100.0)
Platelets: 210 10*3/uL (ref 150–400)
RBC: 4.22 MIL/uL (ref 3.87–5.11)
RDW: 12.9 % (ref 11.5–15.5)
WBC: 6 10*3/uL (ref 4.0–10.5)

## 2013-03-26 MED ORDER — CEFAZOLIN SODIUM-DEXTROSE 2-3 GM-% IV SOLR
2.0000 g | INTRAVENOUS | Status: AC
  Administered 2013-03-27: 2 g via INTRAVENOUS
  Filled 2013-03-26: qty 50

## 2013-03-26 MED ORDER — CHLORHEXIDINE GLUCONATE 4 % EX LIQD: 1.0000 "application " | Freq: Once | CUTANEOUS | Status: DC

## 2013-03-26 NOTE — Pre-Procedure Instructions (Signed)
Brittany Oliver  03/26/2013   Your procedure is scheduled on:  Friday, March 27, 2013 at 1:00 PM.   Report to [facility] Entrance "A" Admitting Office at 11:00 AM.   Call this number if you have problems the morning of surgery: [phone]   Remember:   Do not eat food or drink liquids after midnight tonight, 03/26/13.   Take these medicines the morning of surgery with A SIP OF WATER: gabapentin (NEURONTIN),  OXcarbazepine (TRILEPTAL)     Do not wear jewelry, make-up or nail polish.  Do not wear lotions, powders, or perfumes. You may wear deodorant.  Do not shave 48 hours prior to surgery.   Do not bring valuables to the hospital.  [facility] is not responsible                  for any belongings or valuables.               Contacts, dentures or bridgework may not be worn into surgery.  Leave suitcase in the car. After surgery it may be brought to your room.  For patients admitted to the hospital, discharge time is determined by your                treatment team.               Patients discharged the day of surgery will not be allowed to drive  home.  Name and phone number of your driver: Family/friend   Special Instructions: Shower using CHG 2 nights before surgery and the night before surgery.  If you shower the day of surgery use CHG.  Use special wash - you have one bottle of CHG for all showers.  You should use approximately 1/3 of the bottle for each shower.   Please read over the following fact sheets that you were given: Pain Booklet, Coughing and Deep Breathing and Surgical Site Infection Prevention

## 2013-03-26 NOTE — Progress Notes (Signed)
Anesthesia Chart Review:  Patient is a 73 year old female scheduled for wide excision of LUE melanoma, left axillary SN biopsy on 03/27/13 by Dr. Grandville Silos.  History includes melanoma, HTN, trigeminal neuralgia, HLD, CVA '87, murmur (not specified), former smoker. PCP is Dr. [name].  Preoperative labs noted.  CXR on 03/26/13 showed no active cardiopulmonary disease.  EKG on 03/26/13 showed NSR, cannot rule out anterior infarct (age undetermined).  Overall, I think her EKG is stable when compared to previous EKG on 01/20/08 (see Muse).  Further evaluation by her assigned anesthesiologist on the day of surgery.  If no acute changes then I would anticipate that she could proceed as planned.  [facility] Short Stay Center/Anesthesiology Phone [phone] 03/26/2013 3:25 PM

## 2013-03-27 ENCOUNTER — Encounter (HOSPITAL_COMMUNITY): Payer: Self-pay | Admitting: *Deleted

## 2013-03-27 ENCOUNTER — Encounter (HOSPITAL_COMMUNITY)
Admission: RE | Disposition: A | Discharge status: Home / Self Care | Payer: Self-pay | Source: Ambulatory Visit | Attending: General Surgery

## 2013-03-27 ENCOUNTER — Encounter (HOSPITAL_COMMUNITY): Payer: Medicare Other | Admitting: Vascular Surgery

## 2013-03-27 ENCOUNTER — Ambulatory Visit (HOSPITAL_COMMUNITY): Payer: Medicare Other | Admitting: Anesthesiology

## 2013-03-27 ENCOUNTER — Ambulatory Visit (HOSPITAL_COMMUNITY)
Admission: RE | Admit: 2013-03-27 | Discharge: 2013-03-27 | Disposition: A | Discharge status: Home / Self Care | Payer: Medicare Other | Source: Ambulatory Visit | Attending: General Surgery | Admitting: General Surgery

## 2013-03-27 ENCOUNTER — Encounter (HOSPITAL_COMMUNITY)
Admission: RE | Admit: 2013-03-27 | Discharge: 2013-03-27 | Disposition: A | Discharge status: Home / Self Care | Payer: Medicare Other | Source: Ambulatory Visit | Attending: General Surgery | Admitting: General Surgery

## 2013-03-27 DIAGNOSIS — C4360 Malignant melanoma of unspecified upper limb, including shoulder: Secondary | ICD-10-CM | POA: Insufficient documentation

## 2013-03-27 DIAGNOSIS — I10 Essential (primary) hypertension: Secondary | ICD-10-CM | POA: Diagnosis not present

## 2013-03-27 DIAGNOSIS — Z85820 Personal history of malignant melanoma of skin: Secondary | ICD-10-CM | POA: Diagnosis not present

## 2013-03-27 DIAGNOSIS — C4362 Malignant melanoma of left upper limb, including shoulder: Secondary | ICD-10-CM

## 2013-03-27 DIAGNOSIS — Z87891 Personal history of nicotine dependence: Secondary | ICD-10-CM | POA: Insufficient documentation

## 2013-03-27 DIAGNOSIS — Z8673 Personal history of transient ischemic attack (TIA), and cerebral infarction without residual deficits: Secondary | ICD-10-CM | POA: Diagnosis not present

## 2013-03-27 DIAGNOSIS — L905 Scar conditions and fibrosis of skin: Secondary | ICD-10-CM | POA: Diagnosis not present

## 2013-03-27 DIAGNOSIS — D360 Benign neoplasm of lymph nodes: Secondary | ICD-10-CM | POA: Diagnosis not present

## 2013-03-27 HISTORY — PX: EXCISION MELANOMA WITH SENTINEL LYMPH NODE BIOPSY: SHX5628

## 2013-03-27 SURGERY — EXCISION, MELANOMA, WITH SENTINEL LYMPH NODE BIOPSY
Anesthesia: General | Site: Arm Upper | Laterality: Left

## 2013-03-27 MED ORDER — ACETAMINOPHEN 325 MG PO TABS: 325.0000 mg | ORAL_TABLET | ORAL | Status: DC | PRN

## 2013-03-27 MED ORDER — ACETAMINOPHEN 160 MG/5ML PO SOLN
325.0000 mg | ORAL | Status: DC | PRN
  Filled 2013-03-27: qty 20.3

## 2013-03-27 MED ORDER — PROPOFOL 10 MG/ML IV BOLUS
INTRAVENOUS | Status: DC | PRN
  Administered 2013-03-27: 150 mg via INTRAVENOUS

## 2013-03-27 MED ORDER — ARTIFICIAL TEARS OP OINT
TOPICAL_OINTMENT | OPHTHALMIC | Status: DC | PRN
  Administered 2013-03-27: 1 via OPHTHALMIC

## 2013-03-27 MED ORDER — OXYCODONE HCL 5 MG/5ML PO SOLN: 5.0000 mg | Freq: Once | ORAL | Status: DC | PRN

## 2013-03-27 MED ORDER — ACETAMINOPHEN 500 MG PO TABS
1000.0000 mg | ORAL_TABLET | Freq: Once | ORAL | Status: AC
  Administered 2013-03-27: 1000 mg via ORAL

## 2013-03-27 MED ORDER — LACTATED RINGERS IV SOLN
INTRAVENOUS | Status: DC
  Administered 2013-03-27: 11:00:00 via INTRAVENOUS

## 2013-03-27 MED ORDER — FENTANYL CITRATE 0.05 MG/ML IJ SOLN: 50.0000 ug | Freq: Once | INTRAMUSCULAR | Status: DC

## 2013-03-27 MED ORDER — OXYCODONE HCL 5 MG PO TABS: 5.0000 mg | ORAL_TABLET | Freq: Four times a day (QID) | ORAL | Status: DC | PRN

## 2013-03-27 MED ORDER — FENTANYL CITRATE 0.05 MG/ML IJ SOLN
INTRAMUSCULAR | Status: DC | PRN
  Administered 2013-03-27: 50 ug via INTRAVENOUS

## 2013-03-27 MED ORDER — BACITRACIN ZINC 500 UNIT/GM EX OINT
TOPICAL_OINTMENT | CUTANEOUS | Status: AC
  Filled 2013-03-27: qty 15

## 2013-03-27 MED ORDER — MIDAZOLAM HCL 2 MG/2ML IJ SOLN: 1.0000 mg | INTRAMUSCULAR | Status: DC | PRN

## 2013-03-27 MED ORDER — EPHEDRINE SULFATE 50 MG/ML IJ SOLN
INTRAMUSCULAR | Status: DC | PRN
  Administered 2013-03-27: 5 mg via INTRAVENOUS
  Administered 2013-03-27: 15 mg via INTRAVENOUS
  Administered 2013-03-27: 5 mg via INTRAVENOUS

## 2013-03-27 MED ORDER — SODIUM CHLORIDE 0.9 % IJ SOLN
INTRAMUSCULAR | Status: AC
  Filled 2013-03-27: qty 10

## 2013-03-27 MED ORDER — ACETAMINOPHEN 500 MG PO TABS
ORAL_TABLET | ORAL | Status: AC
  Filled 2013-03-27: qty 2

## 2013-03-27 MED ORDER — METHYLENE BLUE 1 % INJ SOLN
INTRAMUSCULAR | Status: AC
  Filled 2013-03-27: qty 10

## 2013-03-27 MED ORDER — BACITRACIN 500 UNIT/GM EX OINT
TOPICAL_OINTMENT | CUTANEOUS | Status: DC | PRN
  Administered 2013-03-27: 1 via TOPICAL

## 2013-03-27 MED ORDER — LIDOCAINE HCL (CARDIAC) 20 MG/ML IV SOLN
INTRAVENOUS | Status: DC | PRN
  Administered 2013-03-27: 60 mg via INTRAVENOUS

## 2013-03-27 MED ORDER — ONDANSETRON HCL 4 MG/2ML IJ SOLN: 4.0000 mg | Freq: Once | INTRAMUSCULAR | Status: DC | PRN

## 2013-03-27 MED ORDER — MIDAZOLAM HCL 2 MG/2ML IJ SOLN
INTRAMUSCULAR | Status: DC
Start: 2013-03-27 — End: 2013-03-27
  Filled 2013-03-27: qty 2

## 2013-03-27 MED ORDER — 0.9 % SODIUM CHLORIDE (POUR BTL) OPTIME
TOPICAL | Status: DC | PRN
  Administered 2013-03-27: 1000 mL

## 2013-03-27 MED ORDER — SODIUM CHLORIDE 0.9 % IJ SOLN
INTRAMUSCULAR | Status: DC | PRN
  Administered 2013-03-27: 15:00:00

## 2013-03-27 MED ORDER — FENTANYL CITRATE 0.05 MG/ML IJ SOLN
INTRAMUSCULAR | Status: AC
  Filled 2013-03-27: qty 2

## 2013-03-27 MED ORDER — HYDROMORPHONE HCL PF 1 MG/ML IJ SOLN: 0.2500 mg | INTRAMUSCULAR | Status: DC | PRN

## 2013-03-27 MED ORDER — OXYCODONE HCL 5 MG PO TABS: 5.0000 mg | ORAL_TABLET | Freq: Once | ORAL | Status: DC | PRN

## 2013-03-27 MED ORDER — BUPIVACAINE-EPINEPHRINE (PF) 0.25% -1:200000 IJ SOLN
INTRAMUSCULAR | Status: AC
  Filled 2013-03-27: qty 30

## 2013-03-27 MED ORDER — BUPIVACAINE-EPINEPHRINE 0.25% -1:200000 IJ SOLN
INTRAMUSCULAR | Status: DC | PRN
  Administered 2013-03-27: 30 mL

## 2013-03-27 MED ORDER — TECHNETIUM TC 99M SULFUR COLLOID FILTERED
0.5000 | Freq: Once | INTRAVENOUS | Status: AC | PRN
  Administered 2013-03-27: 0.5 via INTRADERMAL

## 2013-03-27 SURGICAL SUPPLY — 67 items
BANDAGE GAUZE ELAST BULKY 4 IN (GAUZE/BANDAGES/DRESSINGS) IMPLANT
BENZOIN TINCTURE PRP APPL 2/3 (GAUZE/BANDAGES/DRESSINGS) IMPLANT
BLADE SURG 10 STRL SS (BLADE) ×2 IMPLANT
BLADE SURG 15 STRL LF DISP TIS (BLADE) ×1 IMPLANT
BLADE SURG 15 STRL SS (BLADE) ×1
BLADE SURG ROTATE 9660 (MISCELLANEOUS) IMPLANT
CANISTER SUCTION 2500CC (MISCELLANEOUS) IMPLANT
CHLORAPREP W/TINT 26ML (MISCELLANEOUS) ×2 IMPLANT
CONT SPEC 4OZ CLIKSEAL STRL BL (MISCELLANEOUS) ×2 IMPLANT
COVER SURGICAL LIGHT HANDLE (MISCELLANEOUS) ×2 IMPLANT
COVER TRANSDUCER ULTRASND GEL (DRAPE) ×2 IMPLANT
DECANTER SPIKE VIAL GLASS SM (MISCELLANEOUS) ×2 IMPLANT
DERMABOND ADVANCED (GAUZE/BANDAGES/DRESSINGS) ×1
DERMABOND ADVANCED .7 DNX12 (GAUZE/BANDAGES/DRESSINGS) ×1 IMPLANT
DRAPE LAPAROTOMY T 102X78X121 (DRAPES) IMPLANT
DRAPE ORTHO SPLIT 77X108 STRL (DRAPES)
DRAPE PED LAPAROTOMY (DRAPES) ×2 IMPLANT
DRAPE SURG ORHT 6 SPLT 77X108 (DRAPES) IMPLANT
DRAPE UTILITY 15X26 W/TAPE STR (DRAPE) ×6 IMPLANT
ELECT CAUTERY BLADE 6.4 (BLADE) ×2 IMPLANT
ELECT REM PT RETURN 9FT ADLT (ELECTROSURGICAL) ×2
ELECTRODE REM PT RTRN 9FT ADLT (ELECTROSURGICAL) ×1 IMPLANT
GAUZE SPONGE 4X4 16PLY XRAY LF (GAUZE/BANDAGES/DRESSINGS) ×2 IMPLANT
GLOVE BIO SURGEON STRL SZ 6 (GLOVE) ×2 IMPLANT
GLOVE BIO SURGEON STRL SZ 6.5 (GLOVE) ×2 IMPLANT
GLOVE BIO SURGEON STRL SZ7.5 (GLOVE) ×2 IMPLANT
GLOVE BIO SURGEON STRL SZ8 (GLOVE) ×4 IMPLANT
GLOVE BIOGEL PI IND STRL 6.5 (GLOVE) ×1 IMPLANT
GLOVE BIOGEL PI IND STRL 7.5 (GLOVE) ×1 IMPLANT
GLOVE BIOGEL PI IND STRL 8 (GLOVE) ×1 IMPLANT
GLOVE BIOGEL PI INDICATOR 6.5 (GLOVE) ×1
GLOVE BIOGEL PI INDICATOR 7.5 (GLOVE) ×1
GLOVE BIOGEL PI INDICATOR 8 (GLOVE) ×1
GOWN STRL NON-REIN LRG LVL3 (GOWN DISPOSABLE) ×4 IMPLANT
GOWN STRL REIN XL XLG (GOWN DISPOSABLE) ×2 IMPLANT
KIT BASIN OR (CUSTOM PROCEDURE TRAY) ×2 IMPLANT
KIT ROOM TURNOVER OR (KITS) ×2 IMPLANT
NEEDLE 22X1 1/2 (OR ONLY) (NEEDLE) ×2 IMPLANT
NS IRRIG 1000ML POUR BTL (IV SOLUTION) ×2 IMPLANT
PACK SURGICAL SETUP 50X90 (CUSTOM PROCEDURE TRAY) ×2 IMPLANT
PAD ARMBOARD 7.5X6 YLW CONV (MISCELLANEOUS) ×2 IMPLANT
PENCIL BUTTON HOLSTER BLD 10FT (ELECTRODE) ×2 IMPLANT
SLEEVE SURGEON STRL (DRAPES) ×2 IMPLANT
SPECIMEN JAR MEDIUM (MISCELLANEOUS) ×2 IMPLANT
SPONGE GAUZE 4X4 12PLY (GAUZE/BANDAGES/DRESSINGS) IMPLANT
SPONGE GAUZE 4X4 12PLY STER LF (GAUZE/BANDAGES/DRESSINGS) ×2 IMPLANT
SPONGE LAP 18X18 X RAY DECT (DISPOSABLE) ×2 IMPLANT
STAPLER VISISTAT 35W (STAPLE) IMPLANT
STOCKINETTE IMPERVIOUS LG (DRAPES) IMPLANT
STRIP CLOSURE SKIN 1/2X4 (GAUZE/BANDAGES/DRESSINGS) IMPLANT
SUT ETHILON 3 0 FSL (SUTURE) ×4 IMPLANT
SUT ETHILON 4 0 PS 2 18 (SUTURE) IMPLANT
SUT MON AB 4-0 PC3 18 (SUTURE) ×2 IMPLANT
SUT SILK 2 0 SH (SUTURE) ×2 IMPLANT
SUT VIC AB 2-0 SH 27 (SUTURE) ×1
SUT VIC AB 2-0 SH 27X BRD (SUTURE) ×1 IMPLANT
SUT VIC AB 3-0 SH 27 (SUTURE) ×4
SUT VIC AB 3-0 SH 27X BRD (SUTURE) ×2 IMPLANT
SUT VIC AB 3-0 SH 27XBRD (SUTURE) ×2 IMPLANT
SYR BULB 3OZ (MISCELLANEOUS) ×2 IMPLANT
SYR CONTROL 10ML LL (SYRINGE) ×2 IMPLANT
TAPE CLOTH SURG 4X10 WHT LF (GAUZE/BANDAGES/DRESSINGS) ×2 IMPLANT
TOWEL OR 17X24 6PK STRL BLUE (TOWEL DISPOSABLE) ×2 IMPLANT
TOWEL OR 17X26 10 PK STRL BLUE (TOWEL DISPOSABLE) ×2 IMPLANT
TUBE CONNECTING 12X1/4 (SUCTIONS) IMPLANT
UNDERPAD 30X30 INCONTINENT (UNDERPADS AND DIAPERS) ×2 IMPLANT
YANKAUER SUCT BULB TIP NO VENT (SUCTIONS) IMPLANT

## 2013-03-27 NOTE — Anesthesia Postprocedure Evaluation (Signed)
  Anesthesia Post-op Note  Patient: [place]  Procedure(s) Performed: Procedure(s): EXCISION MELANOMA LEFT POSTERIOR ARM WITH LEFT AXILLARY SENTINEL LYMPH NODE BIOPSY (Left)  Patient Location: PACU  Anesthesia Type:General  Level of Consciousness: awake, alert  and oriented  Airway and Oxygen Therapy: Patient Spontanous Breathing  Post-op Pain: none  Post-op Assessment: Post-op Vital signs reviewed, Patient's Cardiovascular Status Stable, Respiratory Function Stable, Patent Airway and No signs of Nausea or vomiting  Post-op Vital Signs: Reviewed and stable  Complications: No apparent anesthesia complications

## 2013-03-27 NOTE — Preoperative (Signed)
Beta Blockers   Reason not to administer Beta Blockers:Not Applicable 

## 2013-03-27 NOTE — Anesthesia Preprocedure Evaluation (Addendum)
Anesthesia Evaluation  Patient identified by MRN, date of birth, ID band Patient awake    Reviewed: Allergy & Precautions, H&P , NPO status , Patient's Chart, lab work & pertinent test results, reviewed documented beta blocker date and time   Airway Mallampati: II TM Distance: >3 FB Neck ROM: full    Dental  (+) Partial Upper, Partial Lower and Dental Advisory Given   Pulmonary former smoker,  breath sounds clear to auscultation        Cardiovascular hypertension, On Medications + Valvular Problems/Murmurs Rhythm:regular Rate:Normal     Neuro/Psych CVA negative psych ROS   GI/Hepatic negative GI ROS, Neg liver ROS,   Endo/Other  negative endocrine ROS  Renal/GU negative Renal ROS  negative genitourinary   Musculoskeletal   Abdominal   Peds  Hematology negative hematology ROS (+)   Anesthesia Other Findings See surgeon's H&P   Reproductive/Obstetrics negative OB ROS                          Anesthesia Physical Anesthesia Plan  ASA: III  Anesthesia Plan: General   Post-op Pain Management:    Induction: Intravenous  Airway Management Planned: LMA and Oral ETT  Additional Equipment:   Intra-op Plan:   Post-operative Plan: Extubation in OR  Informed Consent: I have reviewed the patients History and Physical, chart, labs and discussed the procedure including the risks, benefits and alternatives for the proposed anesthesia with the patient or authorized representative who has indicated his/her understanding and acceptance.   Dental Advisory Given  Plan Discussed with: CRNA and Surgeon  Anesthesia Plan Comments:         Anesthesia Quick Evaluation

## 2013-03-27 NOTE — Anesthesia Procedure Notes (Signed)
Procedure Name: LMA Insertion Date/Time: 03/27/2013 2:39 PM Performed by: Sampson Si E Pre-anesthesia Checklist: Patient identified, Emergency Drugs available, Suction available, Patient being monitored and Timeout performed Patient Re-evaluated:Patient Re-evaluated prior to inductionOxygen Delivery Method: Circle system utilized Preoxygenation: Pre-oxygenation with 100% oxygen Intubation Type: IV induction Ventilation: Mask ventilation without difficulty LMA: LMA inserted LMA Size: 4.0 Number of attempts: 1 Placement Confirmation: positive ETCO2 and breath sounds checked- equal and bilateral Tube secured with: Tape Dental Injury: Teeth and Oropharynx as per pre-operative assessment

## 2013-03-27 NOTE — Discharge Instructions (Signed)

## 2013-03-27 NOTE — Op Note (Signed)
03/27/2013  3:39 PM  PATIENT:  Brittany Oliver  73 y.o. female  PRE-OPERATIVE DIAGNOSIS:  MELANOMA LEFT ARM  POST-OPERATIVE DIAGNOSIS:  MELANOMA LEFT ARM  PROCEDURE:  Procedure(s): WIDE EXCISION MELANOMA LEFT POSTERIOR ARM 10X4 CM WITH LAYERED CLOSURE, LEFT AXILLARY SENTINEL LYMPH NODE BIOPSY WITH BLUE DYE INJECTION  SURGEON:  Surgeon(s): Zenovia Jarred, MD  PHYSICIAN ASSISTANT:   ASSISTANTS: none   ANESTHESIA:   local and general  EBL:     BLOOD ADMINISTERED:none  DRAINS: none   SPECIMEN:  Excision  DISPOSITION OF SPECIMEN:  PATHOLOGY  COUNTS:  YES  DICTATION: .Dragon Dictation  Patient presents for left axillary sentinel lymph node biopsy and wide excision carcinoma left posterior arm. She underwent nuclear medicine injection. Informed consent was obtained. She received preoperative antibiotics. Her site was marked. She was brought to the operating room and general anesthesia with laryngeal mask airway was administered by the anesthesia staff. We did time out procedure. Dilute methylene blue was injected cutaneously around her melanoma biopsy site. This was massaged in place for several minutes. Next, her left axilla and arm were prepped and draped in sterile fashion. Attention was directed to the sentinel node biopsy first. Neoprobe was used to localize a hot area in the axilla. Local was injected. Transverse incision was made. Subcutaneous tissues were dissected down and the axillary fat was entered. Using neoprobe guidance, one hot blue lymph node was located. It was circumferentially dissected with cautery and sent to pathology.We stayed away from long thoracic and thoracodorsal nerve locations. No other high readings were obtained in the axilla. Cautery was used to get good hemostasis. The area was irrigated. Subcutaneous tissues were approximated with running 3-0 Vicryl and the skin was closed with running 4-0 Monocryl followed by Dermabond. Attention was directed to the  wide excision. An elliptical space was measured out giving 2 cm margin circumferentially around the biopsy site. Along tissue planes and to facilitate closure, this created a 10 x 4 cm ellipse. Area was injected with local. Elliptical incision was made. Subcutaneous tissues were dissected down to underlying fascia and this was fully removed. It was marked for anatomic orientation for pathology. It was then sent. I changed my gloves and did not reuse instruments. Wound was irrigated. Meticulous hemostasis was ensured. Local anesthetic was injected. Deep tissues were approximated with interrupted 3-0 Vicryl sutures and the skin was closed with interrupted 3-0 nylon sutures. All counts were correct. There were no apparent complications. She tolerated the procedure well and was taken recovery in stable condition.  PATIENT DISPOSITION:  PACU - hemodynamically stable.   Delay start of Pharmacological VTE agent (>24hrs) due to surgical blood loss or risk of bleeding:  no  Georganna Skeans, MD, MPH, FACS Pager: [phone]  1/9/20153:39 PM

## 2013-03-27 NOTE — Transfer of Care (Signed)
Immediate Anesthesia Transfer of Care Note  Patient: Brittany Oliver  Procedure(s) Performed: Procedure(s): EXCISION MELANOMA LEFT POSTERIOR ARM WITH LEFT AXILLARY SENTINEL LYMPH NODE BIOPSY (Left)  Patient Location: PACU  Anesthesia Type:General  Level of Consciousness: awake, alert  and oriented  Airway & Oxygen Therapy: Patient Spontanous Breathing and Patient connected to nasal cannula oxygen  Post-op Assessment: Report given to PACU RN  Post vital signs: Reviewed and stable  Complications: No apparent anesthesia complications

## 2013-03-27 NOTE — Interval H&P Note (Signed)
History and Physical Interval Note:  03/27/2013 1:49 PM  Brittany Oliver  has presented today for surgery, with the diagnosis of MELANOMA LEFT ARM  The various methods of treatment have been discussed with the patient and family. After consideration of risks, benefits and other options for treatment, the patient has consented to  Procedure(s): EXCISION MELANOMA LEFT POSTERIOR ARM WITH LEFT AXILLARY SENTINEL LYMPH NODE BIOPSY (Left) as a surgical intervention .  The patient's history has been reviewed, patient re-examined,site marked, no change in status, stable for surgery.  I have reviewed the patient's chart and labs.  Questions were answered to the patient's satisfaction.     Gray Doering E

## 2013-03-27 NOTE — H&P (View-Only) (Signed)
Patient ID: Brittany Oliver, female   DOB: 11/18/1940, 72 y.o.   MRN: [id number]  Chief Complaint  Patient presents with  . Mass    HPI Brittany Oliver is a 72 y.o. female.  Chief complaint: Melanoma left upper arm HPI Patient was evaluated [facility] Dr. Cathcart performed a shave biopsy on her left upper arm on the medial aspect near her axilla. Pathology returned malignant melanoma, 0.79 mm in thickness. There was no satellitosis, ulceration was focally present. The margin was negative, however, in situ component extended to a lateral margin. This is consistent with a pT1b lesion. I was asked to see her in consultation by Dr. Cathcart regarding further treatment. The patient has no significant complaints from the biopsy site. Her daughter is a CT technologist who works at [employer]. Past Medical History  Diagnosis Date  . Hypertension   . Heart murmur   . Hyperlipidemia   . Stroke     Past Surgical History  Procedure Laterality Date  . Abdominal hysterectomy      History reviewed. No pertinent family history.  Social History History  Substance Use Topics  . Smoking status: Former Smoker    Quit date: 03/23/2005  . Smokeless tobacco: Not on file  . Alcohol Use: 1.2 oz/week    2 Glasses of wine per week    No Known Allergies  Current Outpatient Prescriptions  Medication Sig Dispense Refill  . amoxicillin (AMOXIL) 500 MG capsule       . Betamethasone Valerate 0.12 % foam       . gabapentin (NEURONTIN) 800 MG tablet       . losartan-hydrochlorothiazide (HYZAAR) 100-25 MG per tablet       . pravastatin (PRAVACHOL) 80 MG tablet       . vitamin C (ASCORBIC ACID) 500 MG tablet Take 500 mg by mouth daily.       No current facility-administered medications for this visit.    Review of Systems Review of Systems  Constitutional: Negative for fever, chills and unexpected weight change.  HENT: Negative for congestion, hearing loss, sore  throat, trouble swallowing and voice change.        Chronic left facial neuralgia  Eyes: Negative for visual disturbance.  Respiratory: Negative for cough and wheezing.   Cardiovascular: Negative for chest pain, palpitations and leg swelling.  Gastrointestinal: Negative for nausea, vomiting, abdominal pain, diarrhea, constipation, blood in stool, abdominal distention and anal bleeding.  Genitourinary: Negative for hematuria, vaginal bleeding and difficulty urinating.  Musculoskeletal: Negative for arthralgias.  Skin: Positive for wound. Negative for rash.       Status post biopsy left upper arm  Neurological: Negative for seizures, syncope and headaches.       See above  Hematological: Negative for adenopathy. Does not bruise/bleed easily.  Psychiatric/Behavioral: Negative for confusion.    Blood pressure 120/80, pulse 78, temperature 98.6 F (37 C), temperature source Temporal, resp. rate 14, height 5' 4" (1.626 m), weight 132 lb 6.4 oz (60.056 kg).  Physical Exam Physical Exam  Constitutional: She is oriented to person, place, and time. She appears well-developed and well-nourished. No distress.  HENT:  Head: Normocephalic.  Mouth/Throat: Oropharynx is clear and moist.  Eyes: Conjunctivae and EOM are normal. Pupils are equal, round, and reactive to light. Right eye exhibits no discharge. Left eye exhibits no discharge.  Neck: Normal range of motion. No tracheal deviation present. No thyromegaly present.  Cardiovascular: Normal rate, regular rhythm, normal   heart sounds and intact distal pulses.   Pulmonary/Chest: Effort normal and breath sounds normal. No stridor. No respiratory distress. She has no wheezes. She has no rales.  Abdominal: Soft. Bowel sounds are normal. She exhibits no distension. There is no tenderness.  Musculoskeletal: She exhibits no edema and no tenderness.       Arms: Healing biopsy site left upper inner arm with no surrounding nodularity, no signs of infection    Neurological: She is alert and oriented to person, place, and time.  Skin:  See Musculoskeletal section    Data Reviewed Pathology as described above  Assessment    Melanoma left upper inner arm.    Plan    In light of the thickness, I recommend wide excision as well as sentinel lymph node biopsy. We discussed the risks and benefits as well as some general possibilities of pathologic outcome. We'll plan oncologic referral after surgery as well, and her daughter is a patient of Dr. Marin Olp who [place] would like to see. I look forward to schediling  this in the very near future as an outpatient procedure. Their  questions were answered.       Brittany Oliver 03/23/2013, 1:19 PM

## 2013-03-30 ENCOUNTER — Encounter (INDEPENDENT_AMBULATORY_CARE_PROVIDER_SITE_OTHER): Payer: Self-pay

## 2013-03-30 ENCOUNTER — Encounter (HOSPITAL_COMMUNITY): Payer: Self-pay | Admitting: General Surgery

## 2013-03-31 ENCOUNTER — Telehealth (INDEPENDENT_AMBULATORY_CARE_PROVIDER_SITE_OTHER): Payer: Self-pay | Admitting: *Deleted

## 2013-03-31 ENCOUNTER — Telehealth (INDEPENDENT_AMBULATORY_CARE_PROVIDER_SITE_OTHER): Payer: Self-pay | Admitting: General Surgery

## 2013-03-31 NOTE — Telephone Encounter (Signed)
I have a message already from Dr Grandville Silos to put her on 1/22 when he sees another pt.  I haven't had a chance to check the schedule and call the pt.  If it looks good that day please add on.

## 2013-03-31 NOTE — Telephone Encounter (Signed)
Called patient to let her know about her appt with Dr. Grandville Silos on 04/09/13 @ 345p.  Patient states understanding and agreeable at this time.

## 2013-03-31 NOTE — Telephone Encounter (Signed)
Called her and discussed her pathology

## 2013-03-31 NOTE — Telephone Encounter (Signed)
Patient called asking about her PO appt and when to have her sutures removed.  I do not see any notes stating when you want those out and when you want to see her back.  I explained to the patient that I would look into this and call her back once I hear back from Dr. Grandville Silos.  Patient states understanding and agreeable at this time.

## 2013-04-01 DIAGNOSIS — M069 Rheumatoid arthritis, unspecified: Secondary | ICD-10-CM | POA: Diagnosis not present

## 2013-04-01 DIAGNOSIS — T887XXA Unspecified adverse effect of drug or medicament, initial encounter: Secondary | ICD-10-CM | POA: Diagnosis not present

## 2013-04-01 DIAGNOSIS — Z79899 Other long term (current) drug therapy: Secondary | ICD-10-CM | POA: Diagnosis not present

## 2013-04-02 ENCOUNTER — Encounter (INDEPENDENT_AMBULATORY_CARE_PROVIDER_SITE_OTHER): Payer: Self-pay

## 2013-04-09 ENCOUNTER — Encounter (INDEPENDENT_AMBULATORY_CARE_PROVIDER_SITE_OTHER): Payer: Self-pay | Admitting: General Surgery

## 2013-04-09 ENCOUNTER — Ambulatory Visit (INDEPENDENT_AMBULATORY_CARE_PROVIDER_SITE_OTHER): Payer: Medicare Other | Admitting: General Surgery

## 2013-04-09 VITALS — BP 118/70 | HR 78 | Resp 14 | Ht 63.5 in | Wt 133.6 lb

## 2013-04-09 DIAGNOSIS — C4362 Malignant melanoma of left upper limb, including shoulder: Secondary | ICD-10-CM

## 2013-04-09 DIAGNOSIS — C4360 Malignant melanoma of unspecified upper limb, including shoulder: Secondary | ICD-10-CM

## 2013-04-09 NOTE — Patient Instructions (Signed)
Cover with gauze until drainage stops

## 2013-04-09 NOTE — Progress Notes (Signed)
Subjective:     Patient ID: Brittany Oliver, female   DOB: Dec 04, 1940, 73 y.o.   MRN: [id number]  HPI  Patient is status post wide excision melanoma left upper arm and left axillary sentinel lymph node biopsy. Pathology showed the lymph node was negative and she had no residual melanoma.T1b. She has had a small amount of drainage from her arm and otherwise is doing well. She is no longer taking prescription pain medication. Review of Systems     Objective:   Physical Exam Axillary incision is well-healed. Arm incision is also well-healed. Sutures were removed and Steri-Strips were placed. She had a small seroma at the distal and with mild leakage of clear fluid. No fluctuance and no evidence of infection.    Assessment:     Status post wide excision melanoma left upper arm and left axillary lymph node biopsy and pathology as above    Plan:     Wound care instructions were given. She would like to see Dr. Marin Olp from oncology and I made that referral. I will see her back next month.

## 2013-04-10 ENCOUNTER — Ambulatory Visit (INDEPENDENT_AMBULATORY_CARE_PROVIDER_SITE_OTHER): Payer: Medicare Other | Admitting: General Surgery

## 2013-04-10 NOTE — Addendum Note (Signed)
Addendum created 04/10/13 1116 by Laurie Panda, MD   Modules edited: Anesthesia Attestations

## 2013-04-13 DIAGNOSIS — C4360 Malignant melanoma of unspecified upper limb, including shoulder: Secondary | ICD-10-CM | POA: Diagnosis not present

## 2013-04-14 ENCOUNTER — Telehealth (INDEPENDENT_AMBULATORY_CARE_PROVIDER_SITE_OTHER): Payer: Self-pay | Admitting: General Surgery

## 2013-04-14 NOTE — Telephone Encounter (Signed)
Pt of Dr. Grandville Silos called to report an increase of serosanguinous fluid from her surgical wound, specifically from where the melanoma was removed.  She is concerned about the amount of drainage.  There is no additional pain or signs of infection.  Will have her seen in Urgent office for evaluation.  Discussed keeping it clean with warm soapy water and covered with gauze.

## 2013-04-15 ENCOUNTER — Encounter (INDEPENDENT_AMBULATORY_CARE_PROVIDER_SITE_OTHER): Payer: Self-pay | Admitting: Surgery

## 2013-04-15 ENCOUNTER — Ambulatory Visit (INDEPENDENT_AMBULATORY_CARE_PROVIDER_SITE_OTHER): Payer: Medicare Other | Admitting: Surgery

## 2013-04-15 VITALS — BP 125/71 | HR 66 | Temp 98.1°F | Resp 14 | Ht 64.0 in | Wt 133.2 lb

## 2013-04-15 DIAGNOSIS — I10 Essential (primary) hypertension: Secondary | ICD-10-CM | POA: Diagnosis not present

## 2013-04-15 DIAGNOSIS — M069 Rheumatoid arthritis, unspecified: Secondary | ICD-10-CM | POA: Diagnosis not present

## 2013-04-15 DIAGNOSIS — IMO0002 Reserved for concepts with insufficient information to code with codable children: Secondary | ICD-10-CM

## 2013-04-15 DIAGNOSIS — E782 Mixed hyperlipidemia: Secondary | ICD-10-CM | POA: Diagnosis not present

## 2013-04-15 DIAGNOSIS — Z79899 Other long term (current) drug therapy: Secondary | ICD-10-CM | POA: Diagnosis not present

## 2013-04-15 MED ORDER — DOXYCYCLINE HYCLATE 50 MG PO CAPS: 100.0000 mg | ORAL_CAPSULE | Freq: Two times a day (BID) | ORAL | Status: DC

## 2013-04-15 NOTE — Patient Instructions (Signed)
Take antibiotics.  Return to see Grandville Silos in 2 weeks.  OK to massage wound.

## 2013-04-15 NOTE — Progress Notes (Signed)
Patient returns to have recheck of her left arm wound. She is about 3 weeks status post excision of melanoma by Dr. Grandville Silos. She is developed swelling, redness and pain he wished to be seen today. This started her last 48 hours or so. Pain is minimal. Clear drainage noted from the corner of the incision but the patient. No fever or chills.  Exam: Left arm wound intact. Mild erythema noted. Somewhat fluctuant. Able to massage clear fluid from the corner the incision. No pus.  Impression small postoperative seroma with mild cellulitis  Plan: Doxycycline 100 mg by mouth twice a day for 10 days. May massage in shower. Return to see Dr. Grandville Silos in the next couple of weeks as long as symptoms improve. Return sooner if not.

## 2013-04-23 ENCOUNTER — Telehealth: Payer: Self-pay | Admitting: Neurology

## 2013-04-23 NOTE — Telephone Encounter (Signed)
TRILEPTAL 150MG  big -Pt had surgery on 03/27/2013 after she saw Dr. Tomi Likens and she has been on pain meds and antibiotics. She just now getting ready to finish her antibiotics and has not had a chance to start TRILEPTAL but will start it This coming Monday 04/27/13. She will call in a couple of weeks to give an update oh how it is working for her.

## 2013-04-29 ENCOUNTER — Encounter (HOSPITAL_COMMUNITY): Payer: Self-pay | Admitting: General Surgery

## 2013-04-29 NOTE — OR Nursing (Signed)
Procedure end time not documented day of procedure. End time added.

## 2013-05-07 ENCOUNTER — Emergency Department (HOSPITAL_COMMUNITY)
Admission: EM | Admit: 2013-05-07 | Discharge: 2013-05-07 | Disposition: A | Discharge status: Home / Self Care | Payer: Medicare Other | Attending: Emergency Medicine | Admitting: Emergency Medicine

## 2013-05-07 ENCOUNTER — Emergency Department (HOSPITAL_COMMUNITY): Payer: Medicare Other

## 2013-05-07 ENCOUNTER — Encounter (HOSPITAL_COMMUNITY): Payer: Self-pay | Admitting: Emergency Medicine

## 2013-05-07 DIAGNOSIS — R4182 Altered mental status, unspecified: Secondary | ICD-10-CM | POA: Insufficient documentation

## 2013-05-07 DIAGNOSIS — Z87891 Personal history of nicotine dependence: Secondary | ICD-10-CM | POA: Diagnosis not present

## 2013-05-07 DIAGNOSIS — E785 Hyperlipidemia, unspecified: Secondary | ICD-10-CM | POA: Insufficient documentation

## 2013-05-07 DIAGNOSIS — R011 Cardiac murmur, unspecified: Secondary | ICD-10-CM | POA: Insufficient documentation

## 2013-05-07 DIAGNOSIS — S01511A Laceration without foreign body of lip, initial encounter: Secondary | ICD-10-CM

## 2013-05-07 DIAGNOSIS — Z85820 Personal history of malignant melanoma of skin: Secondary | ICD-10-CM | POA: Insufficient documentation

## 2013-05-07 DIAGNOSIS — S0181XA Laceration without foreign body of other part of head, initial encounter: Secondary | ICD-10-CM

## 2013-05-07 DIAGNOSIS — IMO0002 Reserved for concepts with insufficient information to code with codable children: Secondary | ICD-10-CM | POA: Diagnosis not present

## 2013-05-07 DIAGNOSIS — Z23 Encounter for immunization: Secondary | ICD-10-CM | POA: Diagnosis not present

## 2013-05-07 DIAGNOSIS — S0180XA Unspecified open wound of other part of head, initial encounter: Secondary | ICD-10-CM | POA: Diagnosis not present

## 2013-05-07 DIAGNOSIS — S01501A Unspecified open wound of lip, initial encounter: Secondary | ICD-10-CM | POA: Insufficient documentation

## 2013-05-07 DIAGNOSIS — T1490XA Injury, unspecified, initial encounter: Secondary | ICD-10-CM | POA: Diagnosis not present

## 2013-05-07 DIAGNOSIS — S0100XA Unspecified open wound of scalp, initial encounter: Secondary | ICD-10-CM | POA: Diagnosis not present

## 2013-05-07 DIAGNOSIS — W19XXXA Unspecified fall, initial encounter: Secondary | ICD-10-CM

## 2013-05-07 DIAGNOSIS — Z8673 Personal history of transient ischemic attack (TIA), and cerebral infarction without residual deficits: Secondary | ICD-10-CM | POA: Diagnosis not present

## 2013-05-07 DIAGNOSIS — Y92009 Unspecified place in unspecified non-institutional (private) residence as the place of occurrence of the external cause: Secondary | ICD-10-CM | POA: Insufficient documentation

## 2013-05-07 DIAGNOSIS — Y9389 Activity, other specified: Secondary | ICD-10-CM | POA: Insufficient documentation

## 2013-05-07 DIAGNOSIS — S0993XA Unspecified injury of face, initial encounter: Secondary | ICD-10-CM | POA: Diagnosis not present

## 2013-05-07 DIAGNOSIS — I10 Essential (primary) hypertension: Secondary | ICD-10-CM | POA: Insufficient documentation

## 2013-05-07 DIAGNOSIS — Z8739 Personal history of other diseases of the musculoskeletal system and connective tissue: Secondary | ICD-10-CM | POA: Diagnosis not present

## 2013-05-07 DIAGNOSIS — Z8669 Personal history of other diseases of the nervous system and sense organs: Secondary | ICD-10-CM | POA: Insufficient documentation

## 2013-05-07 DIAGNOSIS — W1809XA Striking against other object with subsequent fall, initial encounter: Secondary | ICD-10-CM | POA: Insufficient documentation

## 2013-05-07 DIAGNOSIS — Z79899 Other long term (current) drug therapy: Secondary | ICD-10-CM | POA: Diagnosis not present

## 2013-05-07 DIAGNOSIS — Z792 Long term (current) use of antibiotics: Secondary | ICD-10-CM | POA: Diagnosis not present

## 2013-05-07 MED ORDER — TETANUS-DIPHTH-ACELL PERTUSSIS 5-2.5-18.5 LF-MCG/0.5 IM SUSP
0.5000 mL | Freq: Once | INTRAMUSCULAR | Status: AC
  Administered 2013-05-07: 0.5 mL via INTRAMUSCULAR
  Filled 2013-05-07: qty 0.5

## 2013-05-07 NOTE — Discharge Instructions (Signed)
The sutures in your lip will dissolve on their own.  If they're still remaining after 5-7 days, they can be removed by your primary care doctor.  The sutures in your forehead will need to be removed in 5 days.  Watch for signs of infection: Redness, swelling, drainage of pus.  Keep ice on the area to help with swelling.  Expect to have a black eye tomorrow.  Tylenol for pain.  Avoid aspirin and ibuprofen.  Change the dressings on your wounds twice a day.  Coat wound with Neosporin.  Once wounds are healed, make sure that you keep the area covered with sunblock to avoid further scarring.   Absorbable Suture Repair Absorbable sutures (stitches) hold skin together so you can heal. Keep skin wounds clean and dry for the next 2 to 3 days. Then, you may gently wash your wound and dress it with an antibiotic ointment as recommended. As your wound begins to heal, the sutures are no longer needed, and they typically begin to fall off. This will take 7 to 10 days. After 10 days, if your sutures are loose, you can remove them by wiping with a clean gauze pad or a cotton ball. Do not pull your sutures out. They should wipe away easily. If after 10 days they do not easily wipe away, have your caregiver take them out. Absorbable sutures may be used deep in a wound to help hold it together. If these stitches are below the skin, the body will absorb them completely in 3 to 4 weeks.  You may need a tetanus shot if:  You cannot remember when you had your last tetanus shot.  You have never had a tetanus shot. If you get a tetanus shot, your arm may swell, get red, and feel warm to the touch. This is common and not a problem. If you need a tetanus shot and you choose not to have one, there is a rare chance of getting tetanus. Sickness from tetanus can be serious. SEEK IMMEDIATE MEDICAL CARE IF:  You have redness in the wound area.  The wound area feels hot to the touch.  You develop swelling in the wound area.  You  develop pain.  There is fluid drainage from the wound. Document Released: 04/12/2004 Document Revised: 05/28/2011 Document Reviewed: 07/25/2010 [facility] Patient Information 2014 [place], [place].  Facial Laceration A facial laceration is a cut on the face. These injuries can be painful and cause bleeding. Some cuts may need to be closed with stitches (sutures), skin adhesive strips, or wound glue. Cuts usually heal quickly but can leave a scar. It can take 1 2 years for the scar to go away completely. HOME CARE   Only take medicines as told by your doctor.  Follow your doctor's instructions for wound care. For Stitches:  Keep the cut clean and dry.  If you have a bandage (dressing), change it at least once a day. Change the bandage if it gets wet or dirty, or as told by your doctor.  Wash the cut with soap and water 2 times a day. Rinse the cut with water. Pat it dry with a clean towel.  Put a thin layer of medicated cream on the cut as told by your doctor.  You may shower after the first 24 hours. Do not soak the cut in water until the stitches are removed.  Have your stitches removed as told by your doctor.  Do not wear any makeup until a few days after  your stitches are removed. For Skin Adhesive Strips:  Keep the cut clean and dry.  Do not get the strips wet. You may take a bath, but be careful to keep the cut dry.  If the cut gets wet, pat it dry with a clean towel.  The strips will fall off on their own. Do not remove the strips that are still stuck to the cut. For Wound Glue:  You may shower or take baths. Do not soak or scrub the cut. Do not swim. Avoid heavy sweating until the glue falls off on its own. After a shower or bath, pat the cut dry with a clean towel.  Do not put medicine or makeup on your cut until the glue falls off.  If you have a bandage, do not put tape over the glue.  Avoid lots of sunlight or tanning lamps until the glue falls off.  The glue  will fall off on its own in 5 10 days. Do not pick at the glue. After Healing: Put sunscreen on the cut for the first year to reduce your scar. GET HELP RIGHT AWAY IF:   Your cut area gets red, painful, or puffy (swollen).  You see a yellowish-white fluid (pus) coming from the cut.  You have chills or a fever. MAKE SURE YOU:   Understand these instructions. Fall Prevention and Home Safety Falls cause injuries and can affect all age groups. It is possible to use preventive measures to significantly decrease the likelihood of falls. There are many simple measures which can make your home safer and prevent falls. OUTDOORS Repair cracks and edges of walkways and driveways. Remove high doorway thresholds. Trim shrubbery on the main path into your home. Have good outside lighting. Clear walkways of tools, rocks, debris, and clutter. Check that handrails are not broken and are securely fastened. Both sides of steps should have handrails. Have leaves, snow, and ice cleared regularly. Use sand or salt on walkways during winter months. In the garage, clean up grease or oil spills. BATHROOM Install night lights. Install grab bars by the toilet and in the tub and shower. Use non-skid mats or decals in the tub or shower. Place a plastic non-slip stool in the shower to sit on, if needed. Keep floors dry and clean up all water on the floor immediately. Remove soap buildup in the tub or shower on a regular basis. Secure bath mats with non-slip, double-sided rug tape. Remove throw rugs and tripping hazards from the floors. BEDROOMS Install night lights. Make sure a bedside light is easy to reach. Do not use oversized bedding. Keep a telephone by your bedside. Have a firm chair with side arms to use for getting dressed. Remove throw rugs and tripping hazards from the floor. KITCHEN Keep handles on pots and pans turned toward the center of the stove. Use back burners when possible. Clean up  spills quickly and allow time for drying. Avoid walking on wet floors. Avoid hot utensils and knives. Position shelves so they are not too high or low. Place commonly used objects within easy reach. If necessary, use a sturdy step stool with a grab bar when reaching. Keep electrical cables out of the way. Do not use floor polish or wax that makes floors slippery. If you must use wax, use non-skid floor wax. Remove throw rugs and tripping hazards from the floor. STAIRWAYS Never leave objects on stairs. Place handrails on both sides of stairways and use them. Fix any loose handrails. Make  sure handrails on both sides of the stairways are as long as the stairs. Check carpeting to make sure it is firmly attached along stairs. Make repairs to worn or loose carpet promptly. Avoid placing throw rugs at the top or bottom of stairways, or properly secure the rug with carpet tape to prevent slippage. Get rid of throw rugs, if possible. Have an electrician put in a light switch at the top and bottom of the stairs. OTHER FALL PREVENTION TIPS Wear low-heel or rubber-soled shoes that are supportive and fit well. Wear closed toe shoes. When using a stepladder, make sure it is fully opened and both spreaders are firmly locked. Do not climb a closed stepladder. Add color or contrast paint or tape to grab bars and handrails in your home. Place contrasting color strips on first and last steps. Learn and use mobility aids as needed. Install an electrical emergency response system. Turn on lights to avoid dark areas. Replace light bulbs that burn out immediately. Get light switches that glow. Arrange furniture to create clear pathways. Keep furniture in the same place. Firmly attach carpet with non-skid or double-sided tape. Eliminate uneven floor surfaces. Select a carpet pattern that does not visually hide the edge of steps. Be aware of all pets. OTHER HOME SAFETY TIPS Set the water temperature for 120 F  (48.8 C). Keep emergency numbers on or near the telephone. Keep smoke detectors on every level of the home and near sleeping areas. Document Released: 02/23/2002 Document Revised: 09/04/2011 Document Reviewed: 05/25/2011 [facility] Patient Information 2014 [place], [place].  Mouth Laceration A mouth laceration is a cut inside the mouth.  HOME CARE Rinse your mouth with warm salt water 4 to 6 times a day. Brush your teeth as usual if you can. Do not eat hot food or have hot drinks while your mouth is still numb. Avoid acidic foods or other foods that bother your cut. Only take medicine as told by your doctor. Keep all doctor visits as told. If there are stitches (sutures) in the mouth, do not play with them with your tongue. You may need a tetanus shot if: You cannot remember when you had your last tetanus shot. You have never had a tetanus shot. If you need a tetanus shot and you choose not to have one, you may get tetanus. Sickness from tetanus can be serious. GET HELP RIGHT AWAY IF:  Your cut or other parts of your face are puffy (swollen) or painful. You have a fever. Your throat is puffy or tender. Your cut breaks open after stiches have been removed. You see yellowish-white fluid (pus) coming from the cut. Scar Minimization You will have a scar anytime you have surgery and a cut is made in the skin or you have something removed from your skin (mole, skin cancer, cyst). Although scars are unavoidable following surgery, there are ways to minimize their appearance. It is important to follow all the instructions you receive from your caregiver about wound care. How your wound heals will influence the appearance of your scar. If you do not follow the wound care instructions as directed, complications such as infection may occur. Wound instructions include keeping the wound clean, moist, and not letting the wound form a scab. Some people form scars that are raised and lumpy (hypertrophic) or  larger than the initial wound (keloidal). HOME CARE INSTRUCTIONS  Follow wound care instructions as directed. Keep the wound clean by washing it with soap and water. Keep the wound moist with  provided antibiotic cream or petroleum jelly until completely healed. Moisten twice a day for about 2 weeks. Get stitches (sutures) taken out at the scheduled time. Avoid touching or manipulating your wound unless needed. Wash your hands thoroughly before and after touching your wound. Follow all restrictions such as limits on exercise or work. This depends on where your scar is located. Keep the scar protected from sunburn. Cover the scar with sunscreen/sunblock with SPF 30 or higher. Gently massage the scar using a circular motion to help minimize the appearance of the scar. Do this only after the wound has closed and all the sutures have been removed. For hypertrophic or keloidal scars, there are several ways to treat and minimize their appearance. Methods include compression therapy, intralesional corticosteroids, laser therapy, or surgery. These methods are performed by your caregiver. Remember that the scar may appear lighter or darker than your normal skin color. This difference in color should even out with time. SEEK MEDICAL CARE IF:  You have a fever. You develop signs of infection such as pain, redness, pus, and warmth. You have questions or concerns. Document Released: 08/23/2009 Document Revised: 05/28/2011 Document Reviewed: 08/23/2009 [employer] Patient Information 2014 [place], [place].  MAKE SURE YOU:  Understand these instructions. Will watch your condition. Will get help right away if you are not doing well or get worse. Document Released: 08/22/2007 Document Revised: 05/28/2011 Document Reviewed: 09/07/2010 [facility] Patient Information 2014 [place], [place].   Will watch your condition.  Will get help right away if you are not doing well or get worse. Document Released: 08/22/2007  Document Revised: 12/24/2012 Document Reviewed: 10/16/2012 [facility] Patient Information 2014 [place], [place].

## 2013-05-07 NOTE — ED Provider Notes (Signed)
CSN: [id number]     Arrival date & time 05/07/13  0130 History   First MD Initiated Contact with Patient 05/07/13 0131     Chief Complaint  Patient presents with  . Fall     (Consider location/radiation/quality/duration/timing/severity/associated sxs/prior Treatment) HPI 73 year old female presents to emergency room from home via EMS after a fall.  Patient has been drinking wine tonight, per family.  She got wrapped up in blankets and fell, striking the fireplace with her face.  There is no reported LOC.  Patient with laceration to left upper lip and right forehead.  Patient was incontinent of stool and urine prior to EMS arrival, patient reports she was trying to get up go to the bathroom Past Medical History  Diagnosis Date  . Hypertension   . Heart murmur   . Hyperlipidemia   . Stroke 1987    has a little balance issue since stroke  . Neuralgia facialis vera   . Cancer 02/2013    skin melanoma  . Arthritis    Past Surgical History  Procedure Laterality Date  . Finger surgery Right     middle finger  . Tonsillectomy    . Appendectomy    . Abdominal hysterectomy  1974    pt still has her uterus, took only 1 ovary and 1 tube  . Eye surgery Bilateral 2006    cataract and laser surgery  . Colonoscopy    . Dilation and curettage of uterus  1965  . Excision melanoma with sentinel lymph node biopsy Left 03/27/2013    Procedure: EXCISION MELANOMA LEFT POSTERIOR ARM WITH LEFT AXILLARY SENTINEL LYMPH NODE BIOPSY;  Surgeon: Zenovia Jarred, MD;  Location: [facility] [facility];  Service: General;  Laterality: Left;   Family History  Problem Relation Age of Onset  . Adopted: Yes  . Ataxia Neg Hx   . Chorea Neg Hx   . Dementia Neg Hx   . Mental retardation Neg Hx   . Migraines Neg Hx   . Multiple sclerosis Neg Hx   . Neurofibromatosis Neg Hx   . Neuropathy Neg Hx   . Parkinsonism Neg Hx   . Seizures Neg Hx   . Stroke Neg Hx    History  Substance Use Topics  . Smoking status: Former Smoker     Quit date: 03/23/2005  . Smokeless tobacco: Never Used  . Alcohol Use: 1.2 oz/week    2 Glasses of wine per week   OB History   Grav Para Term Preterm Abortions TAB SAB Ect Mult Living                 Review of Systems  Unable to perform ROS: Mental status change    Patient is intoxicated  Allergies  Review of patient's allergies indicates no known allergies.  Home Medications   Current Outpatient Rx  Name  Route  Sig  Dispense  Refill  . Betamethasone Valerate 0.12 % foam   Topical   Apply 1 application topically daily.          . diclofenac (FLECTOR) 1.3 % PTCH   Transdermal   Place 1 patch onto the skin daily as needed (for pain).         Marland Kitchen doxycycline (VIBRAMYCIN) 50 MG capsule   Oral   Take 2 capsules (100 mg total) by mouth 2 (two) times daily.   40 capsule   0   . gabapentin (NEURONTIN) 800 MG tablet   Oral   Take 800 mg  by mouth 3 (three) times daily.         Marland Kitchen losartan-hydrochlorothiazide (HYZAAR) 100-25 MG per tablet   Oral   Take 1 tablet by mouth daily.         . OXcarbazepine (TRILEPTAL) 150 MG tablet   Oral   Take 1 tablet (150 mg total) by mouth 2 (two) times daily.   60 tablet   0   . oxyCODONE (ROXICODONE) 5 MG immediate release tablet   Oral   Take 1 tablet (5 mg total) by mouth every 6 (six) hours as needed (pain).   30 tablet   0   . pravastatin (PRAVACHOL) 80 MG tablet   Oral   Take 80 mg by mouth daily.         . vitamin C (ASCORBIC ACID) 500 MG tablet   Oral   Take 500 mg by mouth daily.          There were no vitals taken for this visit. Physical Exam  Nursing note and vitals reviewed. Constitutional: She appears well-developed and well-nourished.  HENT:  Head: Normocephalic.  Right Ear: External ear normal.  Left Ear: External ear normal.  Nose: Nose normal.  Mouth/Throat: Oropharynx is clear and moist.  7 cm, irregular laceration to right for head.  She has jagged through and through laceration to  the left upper lip approximately 2 cm.  She is missing left incisor from her upper bridge.  No other teeth are loose or chipped.  She has no other intraoral trauma  Eyes: Conjunctivae and EOM are normal. Pupils are equal, round, and reactive to light.  Neck: Normal range of motion. Neck supple. No JVD present. No tracheal deviation present. No thyromegaly present.  Cardiovascular: Normal rate, regular rhythm, normal heart sounds and intact distal pulses.  Exam reveals no gallop and no friction rub.   No murmur heard. Pulmonary/Chest: Effort normal and breath sounds normal. No stridor. No respiratory distress. She has no wheezes. She has no rales. She exhibits no tenderness.  Abdominal: Soft. Bowel sounds are normal. She exhibits no distension and no mass. There is no tenderness. There is no rebound and no guarding.  Musculoskeletal: Normal range of motion. She exhibits no edema and no tenderness.  Lymphadenopathy:    She has no cervical adenopathy.  Neurological: She is alert. She has normal reflexes. No cranial nerve deficit. She exhibits normal muscle tone. Coordination normal.  Patient appears intoxicated, with slightly slurred speech, and mild confusion  Skin: Skin is warm and dry. No rash noted. No erythema. No pallor.    ED Course  Procedures (including critical care time) Labs Review Labs Reviewed - No data to display Imaging Review Ct Head Wo Contrast  05/07/2013   CLINICAL DATA:  Fall, facial laceration, alcohol.  EXAM: CT HEAD WITHOUT CONTRAST  CT CERVICAL SPINE WITHOUT CONTRAST  TECHNIQUE: Multidetector CT imaging of the head and cervical spine was performed following the standard protocol without intravenous contrast. Multiplanar CT image reconstructions of the cervical spine were also generated.  COMPARISON:  CT C SPINE W/O CM dated 05/07/2013  FINDINGS: CT HEAD FINDINGS  The ventricles and sulci are normal for age. No intraparenchymal hemorrhage, mass effect nor midline shift.  Minimal supratentorial white matter hypodensities are less than expected for patient's age and though non-specific suggest sequelae of chronic small vessel ischemic disease. No acute large vascular territory infarcts.  No abnormal extra-axial fluid collections. Basal cisterns are patent. Moderate calcific atherosclerosis of the carotid siphons.  No skull fracture. Right supraorbital subcutaneous gas suggesting laceration without radiopaque foreign bodies. Visualized paranasal sinuses and mastoid aircells are well-aerated. The included ocular globes and orbital contents are non-suspicious.  CT CERVICAL SPINE FINDINGS  Cervical vertebral bodies and posterior elements are intact and aligned with straightened cervical lordosis. Moderate to severe C6-7 degenerative disc disease, moderate at C5-6. No destructive bony lesions. C1-2 articulation maintained with moderate arthropathy. The included prevertebral and paraspinal soft tissues are nonsuspicious. Medially deviated left true vocal cord. Mildly heterogeneous thyroid gland.  IMPRESSION: CT head: No acute intracranial process. Right supraorbital subcutaneous gas may reflect laceration without radiopaque foreign bodies nor skull fracture.  CT cervical spine: Straightened cervical lordosis without acute fracture nor malalignment.  Medially deviated left true vocal cord equivocal for paralysis.   Electronically Signed   By: Elon Alas   On: 05/07/2013 03:17   Ct Cervical Spine Wo Contrast  05/07/2013   CLINICAL DATA:  Fall, facial laceration, alcohol.  EXAM: CT HEAD WITHOUT CONTRAST  CT CERVICAL SPINE WITHOUT CONTRAST  TECHNIQUE: Multidetector CT imaging of the head and cervical spine was performed following the standard protocol without intravenous contrast. Multiplanar CT image reconstructions of the cervical spine were also generated.  FINDINGS: CT HEAD FINDINGS  The ventricles and sulci are normal for age. No intraparenchymal hemorrhage, mass effect nor  midline shift. Minimal supratentorial white matter hypodensities are less than expected for patient's age and though non-specific suggest sequelae of chronic small vessel ischemic disease. No acute large vascular territory infarcts.  No abnormal extra-axial fluid collections. Basal cisterns are patent. Moderate calcific atherosclerosis of the carotid siphons.  No skull fracture. Right supraorbital subcutaneous gas suggesting laceration without radiopaque foreign bodies. Visualized paranasal sinuses and mastoid aircells are well-aerated. The included ocular globes and orbital contents are non-suspicious.  CT CERVICAL SPINE FINDINGS  Cervical vertebral bodies and posterior elements are intact and aligned with straightened cervical lordosis. Moderate to severe C6-7 degenerative disc disease, moderate at C5-6. No destructive bony lesions. C1-2 articulation maintained with moderate arthropathy. The included prevertebral and paraspinal soft tissues are nonsuspicious. Medially deviated left true vocal cord. Mildly heterogeneous thyroid gland  IMPRESSION: CT head: No acute intracranial process. Right supraorbital subcutaneous gas may reflect laceration without radiopaque foreign bodies nor skull fracture.  CT cervical spine: Straightened cervical lordosis without acute fracture nor malalignment.  Medially deviated left true vocal cord equivocal for paralysis.  Electronically Signed By: Elon Alas On: 05/07/2013 03:17  COMPARISON: MR HEAD WO/W CM dated 03/24/2007   Electronically Signed   By: Elon Alas   On: 05/07/2013 03:26    EKG Interpretation   None     LACERATION REPAIR Performed by: Kalman Drape Authorized by: Kalman Drape Consent: Verbal consent obtained. Risks and benefits: risks, benefits and alternatives were discussed Consent given by: patient Patient identity confirmed: provided demographic data Prepped and Draped in normal sterile fashion Wound explored  Laceration Location: left  lip through and through  Laceration Length:2cm  No Foreign Bodies seen or palpated  Anesthesia: local infiltration  Local anesthetic: lidocaine 2% with epinephrine  Anesthetic total: 3 ml  Irrigation method: syringe Amount of cleaning: standard  Skin closure: 6.0 chromic gut- 2 interrupted on inside of lip, 1 vertical mattress interior lip 6.0 rapid vicryl 4 simple interrupted on outside lip     Patient tolerance: Patient tolerated the procedure well with no immediate complications.  LACERATION REPAIR Performed by: Kalman Drape Authorized by: Kalman Drape Consent: Verbal consent obtained. Risks and  benefits: risks, benefits and alternatives were discussed Consent given by: patient Patient identity confirmed: provided demographic data Prepped and Draped in normal sterile fashion Wound explored  Laceration Location:right forehead  Laceration Length: 7cm  No Foreign Bodies seen or palpated  Anesthesia: local infiltration  Local anesthetic: lidocaine 2% with epinephrine  Anesthetic total: 5 ml  Irrigation method: syringe Amount of cleaning: standard  Skin closure: 5.0 rapid vicryl deep vertical mattress 3 placed 5.0 prolene simple interrupted 9 placed    Patient tolerance: Patient tolerated the procedure well with no immediate complications.    MDM   Final diagnoses:  Fall at home  Complicated laceration of lip  Laceration of forehead, right, complicated    73 year old female status post mechanical fall while intoxicated.  Lacerations were repaired.  CT scans without significant findings.  Patient has a dentist that she plans to followup with to have her bridge repaired.    Kalman Drape, MD 05/07/13 [phone]

## 2013-05-07 NOTE — ED Notes (Signed)
Per EMS pt reports falling after drinking about 3 glasses of wine. Pt C-spine cleared, laceration to right side of forehead, cut to inside of upper left side of lip that continues to bleed even with pressure.

## 2013-05-07 NOTE — ED Notes (Signed)
Bed: WA13 Expected date:  Expected time:  Means of arrival:  Comments: EMS-fall 

## 2013-05-12 ENCOUNTER — Telehealth: Payer: Self-pay | Admitting: Hematology & Oncology

## 2013-05-12 NOTE — Telephone Encounter (Signed)
Left vm w NEW PATIENT today to remind them of their appointment with Dr. Ennever. Also, advised them to bring all meds and insurance information. ° °

## 2013-05-13 ENCOUNTER — Ambulatory Visit (INDEPENDENT_AMBULATORY_CARE_PROVIDER_SITE_OTHER): Payer: Medicare Other | Admitting: General Surgery

## 2013-05-13 ENCOUNTER — Encounter (INDEPENDENT_AMBULATORY_CARE_PROVIDER_SITE_OTHER): Payer: Self-pay | Admitting: General Surgery

## 2013-05-13 VITALS — BP 156/90 | HR 72 | Temp 98.1°F | Resp 14 | Ht 63.0 in | Wt 132.8 lb

## 2013-05-13 DIAGNOSIS — C4360 Malignant melanoma of unspecified upper limb, including shoulder: Secondary | ICD-10-CM

## 2013-05-13 DIAGNOSIS — C4362 Malignant melanoma of left upper limb, including shoulder: Secondary | ICD-10-CM

## 2013-05-13 NOTE — Progress Notes (Signed)
Subjective:     Patient ID: Brittany Oliver, female   DOB: [birth year], 73 y.o.   MRN: [id number]  HPI Patient presents for followup 1 excision melanoma left arm with left axillary sentinel node biopsy.T1b tumor. She is doing well from that standpoint. She had a cerumen it was drained a couple weeks ago. This has improved. Additionally, last week she suffered a fall and struck her head in the mouth on the fireplace. She was treated in the emergency department including closure of lacerations. There was 7 days ago.  Review of Systems     Objective:   Physical Exam Right forehead laceration is healing well. Prolene sutures in place. Left lip laceration is also healing well. Dissolving sutures in place. Left arm seroma has improved significantly. Only a couple centimeters in size at the more distal portion of her wound. No evidence of infection. No drainage. Axillary incision is well-healed.    Assessment:     Doing well status post wide excision melanoma and sentinel node biopsy, facial lacerations healing well    Plan:     I removed her forehead lacerations. Her lip was closed with chromic and Vicryl so this will dissolve. She is seeing Dr. Marin Olp from oncology tomorrow morning, weather Permitting. I will see her back in 3 months.

## 2013-05-14 ENCOUNTER — Ambulatory Visit: Payer: Medicare Other | Admitting: Hematology & Oncology

## 2013-05-14 ENCOUNTER — Ambulatory Visit: Payer: Medicare Other

## 2013-05-14 ENCOUNTER — Other Ambulatory Visit: Payer: Medicare Other | Admitting: Lab

## 2013-05-15 ENCOUNTER — Encounter: Payer: Self-pay | Admitting: *Deleted

## 2013-05-15 ENCOUNTER — Telehealth: Payer: Self-pay | Admitting: Hematology & Oncology

## 2013-05-15 NOTE — Telephone Encounter (Signed)
Pt called scheduled 3-26 from 2-26 snow. I left RN message to make sure that date was ok since it will be 2 months

## 2013-05-15 NOTE — Progress Notes (Signed)
Spoke with Dr. Marin Olp about rescheduling this pt.  Dr. Marin Olp states to schedule pt for 06/02/13 at 1230.  Charmian Muff notified of date and time.

## 2013-05-20 DIAGNOSIS — H65199 Other acute nonsuppurative otitis media, unspecified ear: Secondary | ICD-10-CM | POA: Diagnosis not present

## 2013-05-20 DIAGNOSIS — Z79899 Other long term (current) drug therapy: Secondary | ICD-10-CM | POA: Diagnosis not present

## 2013-05-20 DIAGNOSIS — M069 Rheumatoid arthritis, unspecified: Secondary | ICD-10-CM | POA: Diagnosis not present

## 2013-05-20 DIAGNOSIS — T887XXA Unspecified adverse effect of drug or medicament, initial encounter: Secondary | ICD-10-CM | POA: Diagnosis not present

## 2013-05-27 DIAGNOSIS — H9319 Tinnitus, unspecified ear: Secondary | ICD-10-CM | POA: Diagnosis not present

## 2013-05-27 DIAGNOSIS — Z23 Encounter for immunization: Secondary | ICD-10-CM | POA: Diagnosis not present

## 2013-05-27 DIAGNOSIS — I10 Essential (primary) hypertension: Secondary | ICD-10-CM | POA: Diagnosis not present

## 2013-05-28 ENCOUNTER — Other Ambulatory Visit: Payer: Self-pay | Admitting: Family Medicine

## 2013-05-28 ENCOUNTER — Telehealth: Payer: Self-pay | Admitting: Neurology

## 2013-05-28 DIAGNOSIS — H9319 Tinnitus, unspecified ear: Secondary | ICD-10-CM

## 2013-05-28 NOTE — Telephone Encounter (Signed)
Pt called to give her monthly update on Trileppal 150mg  bid Update: Pt states that she is pleased with the medication...she feels that it is working.  Pt took the last tablet this morning and would like a refill to be sent to [facility] on [address].

## 2013-05-29 NOTE — Telephone Encounter (Signed)
Patient is aware that RX was refilled

## 2013-06-01 ENCOUNTER — Telehealth: Payer: Self-pay | Admitting: Hematology & Oncology

## 2013-06-01 NOTE — Telephone Encounter (Signed)
Left vm w NEW PATIENT today to remind them of their appointment with Dr. Ennever. Also, advised them to bring all meds and insurance information. ° °

## 2013-06-02 ENCOUNTER — Ambulatory Visit (HOSPITAL_BASED_OUTPATIENT_CLINIC_OR_DEPARTMENT_OTHER): Payer: Medicare Other | Admitting: Hematology & Oncology

## 2013-06-02 ENCOUNTER — Ambulatory Visit: Payer: Medicare Other

## 2013-06-02 ENCOUNTER — Other Ambulatory Visit (HOSPITAL_BASED_OUTPATIENT_CLINIC_OR_DEPARTMENT_OTHER): Payer: Medicare Other | Admitting: Lab

## 2013-06-02 ENCOUNTER — Encounter: Payer: Self-pay | Admitting: Hematology & Oncology

## 2013-06-02 VITALS — BP 151/71 | HR 87 | Temp 98.0°F | Resp 18 | Ht 61.0 in | Wt 129.0 lb

## 2013-06-02 DIAGNOSIS — C4360 Malignant melanoma of unspecified upper limb, including shoulder: Secondary | ICD-10-CM

## 2013-06-02 DIAGNOSIS — C4362 Malignant melanoma of left upper limb, including shoulder: Secondary | ICD-10-CM

## 2013-06-02 LAB — CMP (CANCER CENTER ONLY)
ALT(SGPT): 17 U/L (ref 10–47)
AST: 20 U/L (ref 11–38)
Albumin: 3.7 g/dL (ref 3.3–5.5)
Alkaline Phosphatase: 63 U/L (ref 26–84)
BUN, Bld: 12 mg/dL (ref 7–22)
CO2: 35 mEq/L — ABNORMAL HIGH (ref 18–33)
Calcium: 9.3 mg/dL (ref 8.0–10.3)
Chloride: 89 mEq/L — ABNORMAL LOW (ref 98–108)
Creat: 0.5 mg/dl — ABNORMAL LOW (ref 0.6–1.2)
Glucose, Bld: 122 mg/dL — ABNORMAL HIGH (ref 73–118)
Potassium: 3.2 mEq/L — ABNORMAL LOW (ref 3.3–4.7)
Sodium: 129 mEq/L (ref 128–145)
Total Bilirubin: 0.6 mg/dl (ref 0.20–1.60)
Total Protein: 7.3 g/dL (ref 6.4–8.1)

## 2013-06-02 LAB — CBC WITH DIFFERENTIAL (CANCER CENTER ONLY)
BASO#: 0 10*3/uL (ref 0.0–0.2)
BASO%: 0.5 % (ref 0.0–2.0)
EOS%: 1.9 % (ref 0.0–7.0)
Eosinophils Absolute: 0.1 10*3/uL (ref 0.0–0.5)
HCT: 38.3 % (ref 34.8–46.6)
HGB: 13 g/dL (ref 11.6–15.9)
LYMPH#: 1.1 10*3/uL (ref 0.9–3.3)
LYMPH%: 26.4 % (ref 14.0–48.0)
MCH: 30.7 pg (ref 26.0–34.0)
MCHC: 33.9 g/dL (ref 32.0–36.0)
MCV: 91 fL (ref 81–101)
MONO#: 0.6 10*3/uL (ref 0.1–0.9)
MONO%: 12.9 % (ref 0.0–13.0)
NEUT#: 2.5 10*3/uL (ref 1.5–6.5)
NEUT%: 58.3 % (ref 39.6–80.0)
Platelets: 251 10*3/uL (ref 145–400)
RBC: 4.23 10*6/uL (ref 3.70–5.32)
RDW: 12 % (ref 11.1–15.7)
WBC: 4.3 10*3/uL (ref 3.9–10.0)

## 2013-06-02 LAB — LACTATE DEHYDROGENASE: LDH: 129 U/L (ref 94–250)

## 2013-06-02 NOTE — Progress Notes (Signed)
Referral MD  Reason for Referral: Stage IB melanoma left arm   HPI: Mrs. Profeta is a very nice 73 year old white female. She is adopted. She's had history of non-melanoma skin cancer.  She saw a dermatologist recently. The dermatologist noted a abnormal lesion under the left arm M.D. tricep region. This was biopsied. This was found to be a melanoma. 2 pathology report showed this to be a superficial spreading note no month. It had a Breslow depth of 0.79 mm. There was Clark level IV invasion. There is some focal ulceration. No vascular or lymphatic invasion was noted. There was no regression. No satellite cells were noted.  She then underwent wide local excision. This was done on January 9. The pathology report shows that there is no residual melanoma noted. A left axillary Sentinel node was removed and this was negative.  She was referred to the [facility].  She's feeling well. She's had no problems postop. She's had no arm swelling. She's had no pain. She's had no cough. She's had no headache.  Chief Complaint  Patient presents with  . Follow-up    Past Medical History  Diagnosis Date  . Hypertension   . Heart murmur   . Hyperlipidemia   . Stroke 1987    has a little balance issue since stroke  . Neuralgia facialis vera   . Cancer 02/2013    skin melanoma  . Arthritis   :  Past Surgical History  Procedure Laterality Date  . Finger surgery Right     middle finger  . Tonsillectomy    . Appendectomy    . Abdominal hysterectomy  1974    pt still has her uterus, took only 1 ovary and 1 tube  . Eye surgery Bilateral 2006    cataract and laser surgery  . Colonoscopy    . Dilation and curettage of uterus  1965  . Excision melanoma with sentinel lymph node biopsy Left 03/27/2013    Procedure: EXCISION MELANOMA LEFT POSTERIOR ARM WITH LEFT AXILLARY SENTINEL LYMPH NODE BIOPSY;  Surgeon: Zenovia Jarred, MD;  Location: [facility];  Service: General;  Laterality:  Left;  :  Current outpatient prescriptions:diclofenac (FLECTOR) 1.3 % PTCH, Place 1 patch onto the skin daily as needed (for pain)., Disp: , Rfl: ;  gabapentin (NEURONTIN) 800 MG tablet, Take 800 mg by mouth 3 (three) times daily., Disp: , Rfl: ;  losartan-hydrochlorothiazide (HYZAAR) 100-25 MG per tablet, Take 1 tablet by mouth daily., Disp: , Rfl:  OXcarbazepine (TRILEPTAL) 150 MG tablet, Take 1 tablet (150 mg total) by mouth 2 (two) times daily., Disp: 60 tablet, Rfl: 0;  pravastatin (PRAVACHOL) 80 MG tablet, Take 80 mg by mouth daily., Disp: , Rfl: ;  vitamin C (ASCORBIC ACID) 500 MG tablet, Take 500 mg by mouth daily., Disp: , Rfl: :  :  No Known Allergies:  Family History  Problem Relation Age of Onset  . Adopted: Yes  . Ataxia Neg Hx   . Chorea Neg Hx   . Dementia Neg Hx   . Mental retardation Neg Hx   . Migraines Neg Hx   . Multiple sclerosis Neg Hx   . Neurofibromatosis Neg Hx   . Neuropathy Neg Hx   . Parkinsonism Neg Hx   . Seizures Neg Hx   . Stroke Neg Hx   :  History   Social History  . Marital Status: Married    Spouse Name: N/A    Number of Children: N/A  .  Years of Education: N/A   Occupational History  . Not on file.   Social History Main Topics  . Smoking status: Former Smoker    Quit date: 03/23/2005  . Smokeless tobacco: Never Used  . Alcohol Use: 1.2 oz/week    2 Glasses of wine per week  . Drug Use: No  . Sexual Activity: Not on file   Other Topics Concern  . Not on file   Social History Narrative  . No narrative on file  :  Pertinent items are noted in HPI.  Exam: @IPVITALS @ Well-developed well-nourished female. Vital signs are stable. Temperature 90.8. Blood pressure is 151/71. Pulse 87. Weight is 129 pounds. Head and neck exam shows no adenopathy in the neck. Lungs are clear. Cardiac exam regular in rhythm. Abdomen soft. Has good bowel sounds. No palpable liver or spleen. Back exam no tenderness over the spine ribs or hips.  Extremities no clubbing cyanosis or edema. She has a wide local excision in the proximal tricep area of the left arm. The incision is well-healed. There is little firmness under the incision. Axillary exam shows no axillary adenopathy bilaterally. She has a Sentinel lymph node excision scar in the left axilla. Oropharynx unremarkable. Skin exam does show some hyperpigmented lesions on her back. These do not appear suspicious. Neurological exam no focal neurological deficits.   Recent Labs  06/02/13 1148  WBC 4.3  HGB 13.0  HCT 38.3  PLT 251    Recent Labs  06/02/13 1148  NA 129  K 3.2*  CL 89*  CO2 35*  GLUCOSE 122*  BUN 12  CREATININE 0.5*  CALCIUM 9.3    Blood smear review: None  Pathology:[id number]            [id number] ([facility])    Assessment and Plan: Ms. Priore is a 73 year old white female. She has a stage IB melanoma of the left arm. She is excellent prognostic factors. I think her risk of recurrence is easily less than 10%.  I do not see an indication for additional studies right now. Her laboratory will reviewed with her looks good.  I think we are probably get her back to see [place] in 6 months. She'll be seeing Dr. Grandville Silos of surgery and in 3 months. We will try to alternate visits.  I spent a good 45 is with her today. I went over her path report. Again, I think her risk of recurrence will be less than 10%.

## 2013-06-03 ENCOUNTER — Telehealth: Payer: Self-pay | Admitting: *Deleted

## 2013-06-03 NOTE — Telephone Encounter (Signed)
Refill for oxcarbazepine 150 mg #60 with 1 refill called in  06/03/13  1 bid

## 2013-06-04 ENCOUNTER — Other Ambulatory Visit: Payer: Medicare Other

## 2013-06-04 ENCOUNTER — Telehealth: Payer: Self-pay | Admitting: Hematology & Oncology

## 2013-06-04 NOTE — Telephone Encounter (Signed)
Mailed 11-2013 schedule °

## 2013-06-11 ENCOUNTER — Other Ambulatory Visit: Payer: Medicare Other | Admitting: Lab

## 2013-06-11 ENCOUNTER — Ambulatory Visit: Payer: Medicare Other | Admitting: Hematology & Oncology

## 2013-06-11 ENCOUNTER — Ambulatory Visit: Payer: Medicare Other

## 2013-06-18 DIAGNOSIS — M778 Other enthesopathies, not elsewhere classified: Secondary | ICD-10-CM | POA: Diagnosis not present

## 2013-06-20 DIAGNOSIS — M778 Other enthesopathies, not elsewhere classified: Secondary | ICD-10-CM | POA: Diagnosis not present

## 2013-06-23 ENCOUNTER — Ambulatory Visit: Payer: Medicare Other | Admitting: Neurology

## 2013-06-25 DIAGNOSIS — E876 Hypokalemia: Secondary | ICD-10-CM | POA: Diagnosis not present

## 2013-07-01 DIAGNOSIS — Z79899 Other long term (current) drug therapy: Secondary | ICD-10-CM | POA: Diagnosis not present

## 2013-07-01 DIAGNOSIS — T887XXA Unspecified adverse effect of drug or medicament, initial encounter: Secondary | ICD-10-CM | POA: Diagnosis not present

## 2013-07-01 DIAGNOSIS — M069 Rheumatoid arthritis, unspecified: Secondary | ICD-10-CM | POA: Diagnosis not present

## 2013-07-02 DIAGNOSIS — E876 Hypokalemia: Secondary | ICD-10-CM | POA: Diagnosis not present

## 2013-07-03 ENCOUNTER — Ambulatory Visit: Payer: Medicare Other

## 2013-07-03 ENCOUNTER — Ambulatory Visit: Payer: Medicare Other | Admitting: Hematology & Oncology

## 2013-07-03 ENCOUNTER — Other Ambulatory Visit: Payer: Medicare Other | Admitting: Lab

## 2013-07-13 ENCOUNTER — Ambulatory Visit (INDEPENDENT_AMBULATORY_CARE_PROVIDER_SITE_OTHER): Payer: Medicare Other | Admitting: Neurology

## 2013-07-13 ENCOUNTER — Encounter: Payer: Self-pay | Admitting: Neurology

## 2013-07-13 VITALS — BP 132/70 | HR 76 | Temp 98.0°F | Resp 18 | Ht 63.0 in | Wt 128.9 lb

## 2013-07-13 DIAGNOSIS — G501 Atypical facial pain: Secondary | ICD-10-CM | POA: Diagnosis not present

## 2013-07-13 MED ORDER — GABAPENTIN 800 MG PO TABS: 800.0000 mg | ORAL_TABLET | Freq: Three times a day (TID) | ORAL | Status: DC

## 2013-07-13 MED ORDER — GABAPENTIN 100 MG PO CAPS: 100.0000 mg | ORAL_CAPSULE | Freq: Three times a day (TID) | ORAL | Status: DC

## 2013-07-13 NOTE — Patient Instructions (Addendum)
1.  We will increase the gabapentin to 900mg  three times daily.  Since you still have 800mg  pills, I will prescribe 100mg  pills to take along with each dose of 800mg .  2.  Follow up in 3 months.

## 2013-07-13 NOTE — Progress Notes (Signed)
NEUROLOGY FOLLOW UP OFFICE NOTE  MKENZIE DOTTS [id number]  HISTORY OF PRESENT ILLNESS: Amylynn Fano is a 73 year old right-handed woman with history of posterior circulation stroke, hypertension, and hypercholesterolemia who follows up for atypical left-sided facial pain.  Records and images were personally reviewed where available.    UPDATE: She was started on Trileptal 150mg  twice daily.  Routine lab work from March revealed WBC 4.3, HGB 13, HCT 38.3, PLT 251, Na 129, K 3.2, Cl 89, glucose 122, BUN 12, Cr 0.5, TB 0.60, AP 63, AST 20, ALT 17, TP 7.3, Alb 3.7, Ca 9.3.  Earlier this month, she went on a cruise and got sick.  She felt nauseous and dizzy.  She saw the doctor and her Na was 121 and K 2.2.  She was given 0.9% NS and KCl.  After a couple of days, she felt better and her Na and K were up (in the 130s and above 4 respectively).  Since that time, she has not taken the Trileptal.  She continues taking the gabapentin 800mg  TID and it keeps the pain under pretty good control but still has some discomfort.  HISTORY: She began experiencing left-sided facial pain in 2006.  She reports a fairly constant burning and pins and needles sensation in the left V1 and V2 distribution.  Light touch, cold and wind can trigger a shooting pain down the face, but there is no spontaneous paroxysmal shooting pain. Eating and brushing her teeth does not aggravate it.  She says heat seems to help relieve it.  She was eventually diagnosed with left trigeminal neuralgia in 2012.  Current Medication:  gabapentin 800mg  TID (helps but still lingering pain) Past medications:  none Other therapy:  Gamma Knife x2 (ineffective).  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  She does have a history of brainstem stroke in 1987, in which she  presented with left oculomotor weakness, left rotatory nystagmus, left facial numbness and dysfunction of her right arm and leg.  She still has residual numbness of the left face.  PAST MEDICAL HISTORY: Past Medical History  Diagnosis Date  . Hypertension   . Heart murmur   . Hyperlipidemia   . Stroke 1987    has a little balance issue since stroke  . Neuralgia facialis vera   . Cancer 02/2013    skin melanoma  . Arthritis     MEDICATIONS: Current Outpatient Prescriptions on File Prior to Visit  Medication Sig Dispense Refill  . diclofenac (FLECTOR) 1.3 % PTCH Place 1 patch onto the skin daily as needed (for pain).      Marland Kitchen losartan-hydrochlorothiazide (HYZAAR) 100-25 MG per tablet Take 1 tablet by mouth daily.      . OXcarbazepine (TRILEPTAL) 150 MG tablet Take 1 tablet (150 mg total) by mouth 2 (two) times daily.  60 tablet  0  . pravastatin (PRAVACHOL) 80 MG tablet Take 80 mg by mouth daily.      . vitamin C (ASCORBIC ACID) 500 MG tablet Take 500 mg by mouth daily.       No current facility-administered medications on file prior to visit.    ALLERGIES: No Known Allergies  FAMILY HISTORY: Family History  Problem Relation Age of Onset  . Adopted: Yes  . Ataxia Neg Hx   . Chorea Neg Hx   . Dementia Neg Hx   .  Mental retardation Neg Hx   . Migraines Neg Hx   . Multiple sclerosis Neg Hx   . Neurofibromatosis Neg Hx   . Neuropathy Neg Hx   . Parkinsonism Neg Hx   . Seizures Neg Hx   . Stroke Neg Hx     SOCIAL HISTORY: History   Social History  . Marital Status: Married    Spouse Name: N/A    Number of Children: N/A  . Years of Education: N/A   Occupational History  . Not on file.   Social History Main Topics  . Smoking status: Former Smoker    Quit date: 03/23/2005  . Smokeless tobacco: Never Used  . Alcohol Use: 1.2 oz/week    2 Glasses of wine per week  . Drug Use: No  . Sexual Activity: Not on file   Other Topics Concern  . Not on file   Social  History Narrative  . No narrative on file    REVIEW OF SYSTEMS: Constitutional: No fevers, chills, or sweats, no generalized fatigue, change in appetite Eyes: No visual changes, double vision, eye pain Ear, nose and throat: No hearing loss, ear pain, nasal congestion, sore throat Cardiovascular: No chest pain, palpitations Respiratory:  No shortness of breath at rest or with exertion, wheezes GastrointestinaI: No nausea, vomiting, diarrhea, abdominal pain, fecal incontinence Genitourinary:  No dysuria, urinary retention or frequency Musculoskeletal:  No neck pain, back pain Integumentary: No rash, pruritus, skin lesions Neurological: as above Psychiatric: No depression, insomnia, anxiety Endocrine: No palpitations, fatigue, diaphoresis, mood swings, change in appetite, change in weight, increased thirst Hematologic/Lymphatic:  No anemia, purpura, petechiae. Allergic/Immunologic: no itchy/runny eyes, nasal congestion, recent allergic reactions, rashes  PHYSICAL EXAM: Filed Vitals:   07/13/13 1320  BP: 132/70  Pulse: 76  Temp: 98 F (36.7 C)  Resp: 18   General: No acute distress Head:  Normocephalic/atraumatic Neck: supple, no paraspinal tenderness, full range of motion Heart:  Regular rate and rhythm Lungs:  Clear to auscultation bilaterally Back: No paraspinal tenderness Neurological Exam: alert and oriented to person, place, and time. Attention span and concentration intact, recent and remote memory intact, fund of knowledge intact.  Speech fluent and not dysarthric, language intact.  Mild left sided facial numbness.  Otherwise CN II-XII intact. Fundoscopic exam unremarkable without vessel changes, exudates, hemorrhages or papilledema.  Bulk and tone normal, muscle strength 5/5 throughout.  Sensation to light touch, temperature and vibration intact.  Deep tendon reflexes 2+ throughout, toes downgoing.  Finger to nose intact.  Gait normal, Romberg  negative.  IMPRESSION: Left-sided atypical facial pain.  Hyponatremia may have been secondary to Trileptal.  PLAN: 1.  Will increase gabapentin to 900mg  three times daily.  If ineffective, consider adding Lamictal. 2.  Follow up in 3 months.  Metta Clines, DO  CC:  [name], MD

## 2013-08-14 DIAGNOSIS — T887XXA Unspecified adverse effect of drug or medicament, initial encounter: Secondary | ICD-10-CM | POA: Diagnosis not present

## 2013-08-14 DIAGNOSIS — M069 Rheumatoid arthritis, unspecified: Secondary | ICD-10-CM | POA: Diagnosis not present

## 2013-08-14 DIAGNOSIS — Z79899 Other long term (current) drug therapy: Secondary | ICD-10-CM | POA: Diagnosis not present

## 2013-08-19 ENCOUNTER — Ambulatory Visit (INDEPENDENT_AMBULATORY_CARE_PROVIDER_SITE_OTHER): Payer: Medicare Other | Admitting: General Surgery

## 2013-08-26 ENCOUNTER — Ambulatory Visit (INDEPENDENT_AMBULATORY_CARE_PROVIDER_SITE_OTHER): Payer: Medicare Other | Admitting: General Surgery

## 2013-08-26 ENCOUNTER — Encounter (INDEPENDENT_AMBULATORY_CARE_PROVIDER_SITE_OTHER): Payer: Self-pay | Admitting: General Surgery

## 2013-08-26 DIAGNOSIS — H5500 Unspecified nystagmus: Secondary | ICD-10-CM | POA: Diagnosis not present

## 2013-08-26 DIAGNOSIS — C4360 Malignant melanoma of unspecified upper limb, including shoulder: Secondary | ICD-10-CM | POA: Diagnosis not present

## 2013-08-26 DIAGNOSIS — R229 Localized swelling, mass and lump, unspecified: Secondary | ICD-10-CM

## 2013-08-26 DIAGNOSIS — Z961 Presence of intraocular lens: Secondary | ICD-10-CM | POA: Diagnosis not present

## 2013-08-26 DIAGNOSIS — R2230 Localized swelling, mass and lump, unspecified upper limb: Secondary | ICD-10-CM | POA: Insufficient documentation

## 2013-08-26 DIAGNOSIS — C4362 Malignant melanoma of left upper limb, including shoulder: Secondary | ICD-10-CM

## 2013-08-26 DIAGNOSIS — H43819 Vitreous degeneration, unspecified eye: Secondary | ICD-10-CM | POA: Diagnosis not present

## 2013-08-26 DIAGNOSIS — H52209 Unspecified astigmatism, unspecified eye: Secondary | ICD-10-CM | POA: Diagnosis not present

## 2013-08-26 NOTE — Progress Notes (Signed)
Patient ID: Brittany Oliver, female   DOB: 29-Sep-1940, 73 y.o.   MRN: [id number]  No chief complaint on file.   HPI Brittany Oliver is a 73 y.o. female.  Chief complaint: Followup melanoma HPI Patient is status post wide excision melanoma left arm in January of 2015. She also had left axillary lymph node biopsy. She initially had some swelling along her incision. She presents for followup at three-month time period. She claims she has had a mass under her incision ever since surgery. She does not feel it has changed. It is not painful. She relates a story from a recent cruise when she had hypokalemia and was treated with IV infusion on the boat. No other complaints. Past Medical History  Diagnosis Date  . Hypertension   . Heart murmur   . Hyperlipidemia   . Stroke 1987    has a little balance issue since stroke  . Neuralgia facialis vera   . Cancer 02/2013    skin melanoma  . Arthritis     Past Surgical History  Procedure Laterality Date  . Finger surgery Right     middle finger  . Tonsillectomy    . Appendectomy    . Abdominal hysterectomy  1974    pt still has her uterus, took only 1 ovary and 1 tube  . Eye surgery Bilateral 2006    cataract and laser surgery  . Colonoscopy    . Dilation and curettage of uterus  1965  . Excision melanoma with sentinel lymph node biopsy Left 03/27/2013    Procedure: EXCISION MELANOMA LEFT POSTERIOR ARM WITH LEFT AXILLARY SENTINEL LYMPH NODE BIOPSY;  Surgeon: Zenovia Jarred, MD;  Location: [facility];  Service: General;  Laterality: Left;    Family History  Problem Relation Age of Onset  . Adopted: Yes  . Ataxia Neg Hx   . Chorea Neg Hx   . Dementia Neg Hx   . Mental retardation Neg Hx   . Migraines Neg Hx   . Multiple sclerosis Neg Hx   . Neurofibromatosis Neg Hx   . Neuropathy Neg Hx   . Parkinsonism Neg Hx   . Seizures Neg Hx   . Stroke Neg Hx     Social History History  Substance Use Topics  . Smoking status: Former  Smoker    Quit date: 03/23/2005  . Smokeless tobacco: Never Used  . Alcohol Use: 1.2 oz/week    2 Glasses of wine per week    No Known Allergies  Current Outpatient Prescriptions  Medication Sig Dispense Refill  . diclofenac (FLECTOR) 1.3 % PTCH Place 1 patch onto the skin daily as needed (for pain).      Marland Kitchen gabapentin (NEURONTIN) 100 MG capsule Take 1 capsule (100 mg total) by mouth 3 (three) times daily.  90 capsule  3  . gabapentin (NEURONTIN) 800 MG tablet Take 1 tablet (800 mg total) by mouth 3 (three) times daily.  90 tablet  3  . losartan-hydrochlorothiazide (HYZAAR) 100-25 MG per tablet Take 1 tablet by mouth daily.      . OXcarbazepine (TRILEPTAL) 150 MG tablet Take 1 tablet (150 mg total) by mouth 2 (two) times daily.  60 tablet  0  . pravastatin (PRAVACHOL) 80 MG tablet Take 80 mg by mouth daily.      . vitamin C (ASCORBIC ACID) 500 MG tablet Take 500 mg by mouth daily.       No current facility-administered medications for this visit.  Review of Systems Review of Systems  Constitutional: Negative.   HENT: Negative.   Eyes: Negative.   Respiratory: Negative.   Cardiovascular: Negative.   Gastrointestinal: Negative.   Endocrine: Negative.   Genitourinary: Negative.   Musculoskeletal:       See history of present illness  Skin: Negative.   Allergic/Immunologic: Negative.   Neurological: Negative.   Hematological: Negative.   Psychiatric/Behavioral: Negative.     There were no vitals taken for this visit.  Physical Exam Physical Exam  Constitutional: She is oriented to person, place, and time. She appears well-developed and well-nourished. No distress.  HENT:  Head: Normocephalic.  Eyes: EOM are normal. Pupils are equal, round, and reactive to light.  Neck: Normal range of motion. Neck supple.  Cardiovascular: Normal rate and normal heart sounds.   Pulmonary/Chest: Effort normal and breath sounds normal.  Abdominal: Soft. She exhibits no distension. There  is no tenderness.  Musculoskeletal:  Left axillary scar well healed with small palpable scar subcutaneously, left arm incision well-healed, distal portion of the incision has a subcutaneous nodule which is oval and 1.5 cm, nontender, no overlying skin changes  Neurological: She is alert and oriented to person, place, and time.  Skin: Skin is warm.    Assessment    Subcutaneous nodule at site of previous wide excision of melanoma L arm    Plan    I am concerned in light of her history. I feel this nodule needs to be excised. We will plan to excise it as an outpatient procedure. Procedure, risks, benefits were discussed in detail with the patient. She is agreeable.       Basya Casavant E 08/26/2013, 10:43 AM

## 2013-09-01 ENCOUNTER — Encounter (HOSPITAL_COMMUNITY): Payer: Self-pay | Admitting: Pharmacy Technician

## 2013-09-01 ENCOUNTER — Encounter (INDEPENDENT_AMBULATORY_CARE_PROVIDER_SITE_OTHER): Payer: Self-pay | Admitting: General Surgery

## 2013-09-01 DIAGNOSIS — L570 Actinic keratosis: Secondary | ICD-10-CM | POA: Diagnosis not present

## 2013-09-01 DIAGNOSIS — Z85820 Personal history of malignant melanoma of skin: Secondary | ICD-10-CM | POA: Diagnosis not present

## 2013-09-01 DIAGNOSIS — L820 Inflamed seborrheic keratosis: Secondary | ICD-10-CM | POA: Diagnosis not present

## 2013-09-01 DIAGNOSIS — L821 Other seborrheic keratosis: Secondary | ICD-10-CM | POA: Diagnosis not present

## 2013-09-01 DIAGNOSIS — D1801 Hemangioma of skin and subcutaneous tissue: Secondary | ICD-10-CM | POA: Diagnosis not present

## 2013-09-01 DIAGNOSIS — D485 Neoplasm of uncertain behavior of skin: Secondary | ICD-10-CM | POA: Diagnosis not present

## 2013-09-03 DIAGNOSIS — T887XXA Unspecified adverse effect of drug or medicament, initial encounter: Secondary | ICD-10-CM | POA: Diagnosis not present

## 2013-09-03 DIAGNOSIS — R5383 Other fatigue: Secondary | ICD-10-CM | POA: Diagnosis not present

## 2013-09-03 DIAGNOSIS — M546 Pain in thoracic spine: Secondary | ICD-10-CM | POA: Diagnosis not present

## 2013-09-03 DIAGNOSIS — M069 Rheumatoid arthritis, unspecified: Secondary | ICD-10-CM | POA: Diagnosis not present

## 2013-09-03 DIAGNOSIS — M899 Disorder of bone, unspecified: Secondary | ICD-10-CM | POA: Diagnosis not present

## 2013-09-03 DIAGNOSIS — R413 Other amnesia: Secondary | ICD-10-CM | POA: Diagnosis not present

## 2013-09-03 DIAGNOSIS — E638 Other specified nutritional deficiencies: Secondary | ICD-10-CM | POA: Diagnosis not present

## 2013-09-03 DIAGNOSIS — R5381 Other malaise: Secondary | ICD-10-CM | POA: Diagnosis not present

## 2013-09-03 DIAGNOSIS — D649 Anemia, unspecified: Secondary | ICD-10-CM | POA: Diagnosis not present

## 2013-09-03 DIAGNOSIS — Z79899 Other long term (current) drug therapy: Secondary | ICD-10-CM | POA: Diagnosis not present

## 2013-09-08 NOTE — Pre-Procedure Instructions (Signed)
Brittany Oliver  09/08/2013   Your procedure is scheduled on:  Tuesday, June 30th   Report to [facility] Admitting at 8:00 AM.   Call this number if you have problems the morning of surgery: [phone]   Remember:   Do not eat food or drink liquids after midnight Monday.   Take these medicines the morning of surgery with A SIP OF WATER: Gabapentin.  Please use Flonase   Do not wear jewelry, make-up or nail polish.  Do not wear lotions, powders, or perfumes. You may NOT wear deodorant.  Do not shave underarms & legs 48 hours prior to surgery.    Do not bring valuables to the hospital.  [facility] is not responsible for any belongings or valuables.               Contacts, dentures or bridgework may not be worn into surgery.  Leave suitcase in the car. After surgery it may be brought to your room.                 Patients discharged the day of surgery will not be allowed to drive home, and will need a responsible person to stay with you for the first 24 hrs after surgery.   Name and phone number of your driver:    Special Instructions: "Preparing for Surgery" instruction sheet.   Please read over the following fact sheets that you were given: Pain Booklet and Surgical Site Infection Prevention

## 2013-09-09 ENCOUNTER — Encounter (HOSPITAL_COMMUNITY): Payer: Self-pay

## 2013-09-09 ENCOUNTER — Encounter (HOSPITAL_COMMUNITY)
Admission: RE | Admit: 2013-09-09 | Discharge: 2013-09-09 | Disposition: A | Discharge status: Home / Self Care | Payer: Medicare Other | Source: Ambulatory Visit | Attending: General Surgery | Admitting: General Surgery

## 2013-09-09 DIAGNOSIS — Z01818 Encounter for other preprocedural examination: Secondary | ICD-10-CM | POA: Diagnosis not present

## 2013-09-09 DIAGNOSIS — Z01812 Encounter for preprocedural laboratory examination: Secondary | ICD-10-CM | POA: Diagnosis not present

## 2013-09-09 LAB — COMPREHENSIVE METABOLIC PANEL
ALT: 13 U/L (ref 0–35)
AST: 18 U/L (ref 0–37)
Albumin: 3.7 g/dL (ref 3.5–5.2)
Alkaline Phosphatase: 71 U/L (ref 39–117)
BUN: 18 mg/dL (ref 6–23)
CO2: 27 mEq/L (ref 19–32)
Calcium: 9.5 mg/dL (ref 8.4–10.5)
Chloride: 98 mEq/L (ref 96–112)
Creatinine, Ser: 0.72 mg/dL (ref 0.50–1.10)
GFR calc Af Amer: >90 mL/min (ref >90)
GFR calc non Af Amer: 84 mL/min — ABNORMAL LOW (ref >90)
Glucose, Bld: 103 mg/dL — ABNORMAL HIGH (ref 70–99)
Potassium: 4.3 mEq/L (ref 3.7–5.3)
Sodium: 139 mEq/L (ref 137–147)
Total Bilirubin: 0.4 mg/dL (ref 0.3–1.2)
Total Protein: 6.8 g/dL (ref 6.0–8.3)

## 2013-09-09 LAB — CBC
HCT: 38.5 % (ref 36.0–46.0)
Hemoglobin: 12.6 g/dL (ref 12.0–15.0)
MCH: 30.2 pg (ref 26.0–34.0)
MCHC: 32.7 g/dL (ref 30.0–36.0)
MCV: 92.3 fL (ref 78.0–100.0)
Platelets: 170 10*3/uL (ref 150–400)
RBC: 4.17 MIL/uL (ref 3.87–5.11)
RDW: 13.4 % (ref 11.5–15.5)
WBC: 4.9 10*3/uL (ref 4.0–10.5)

## 2013-09-09 NOTE — Progress Notes (Addendum)
PCP--Dr. [name] Oncologist-- Burney Gauze Neurologist -- Dr. Erin Sons no cardiologist . Had stroke in 1987--nothing since.

## 2013-09-14 MED ORDER — CEFAZOLIN SODIUM-DEXTROSE 2-3 GM-% IV SOLR
2.0000 g | INTRAVENOUS | Status: AC
Start: 2013-09-15 — End: 2013-09-15
  Administered 2013-09-15: 2 g via INTRAVENOUS
  Filled 2013-09-14: qty 50

## 2013-09-15 ENCOUNTER — Encounter (HOSPITAL_COMMUNITY): Payer: Self-pay | Admitting: Anesthesiology

## 2013-09-15 ENCOUNTER — Ambulatory Visit (HOSPITAL_COMMUNITY): Payer: Medicare Other | Admitting: Anesthesiology

## 2013-09-15 ENCOUNTER — Ambulatory Visit (HOSPITAL_COMMUNITY)
Admission: RE | Admit: 2013-09-15 | Discharge: 2013-09-15 | Disposition: A | Discharge status: Home / Self Care | Payer: Medicare Other | Source: Ambulatory Visit | Attending: General Surgery | Admitting: General Surgery

## 2013-09-15 ENCOUNTER — Encounter (HOSPITAL_COMMUNITY)
Admission: RE | Disposition: A | Discharge status: Home / Self Care | Payer: Self-pay | Source: Ambulatory Visit | Attending: General Surgery

## 2013-09-15 ENCOUNTER — Encounter (HOSPITAL_COMMUNITY): Payer: Medicare Other | Admitting: Anesthesiology

## 2013-09-15 DIAGNOSIS — I10 Essential (primary) hypertension: Secondary | ICD-10-CM | POA: Insufficient documentation

## 2013-09-15 DIAGNOSIS — D487 Neoplasm of uncertain behavior of other specified sites: Secondary | ICD-10-CM | POA: Diagnosis not present

## 2013-09-15 DIAGNOSIS — E785 Hyperlipidemia, unspecified: Secondary | ICD-10-CM | POA: Insufficient documentation

## 2013-09-15 DIAGNOSIS — Z87891 Personal history of nicotine dependence: Secondary | ICD-10-CM | POA: Diagnosis not present

## 2013-09-15 DIAGNOSIS — Z85820 Personal history of malignant melanoma of skin: Secondary | ICD-10-CM | POA: Diagnosis not present

## 2013-09-15 DIAGNOSIS — D2110 Benign neoplasm of connective and other soft tissue of unspecified upper limb, including shoulder: Secondary | ICD-10-CM

## 2013-09-15 DIAGNOSIS — R2232 Localized swelling, mass and lump, left upper limb: Secondary | ICD-10-CM

## 2013-09-15 DIAGNOSIS — M129 Arthropathy, unspecified: Secondary | ICD-10-CM | POA: Diagnosis not present

## 2013-09-15 DIAGNOSIS — L723 Sebaceous cyst: Secondary | ICD-10-CM | POA: Insufficient documentation

## 2013-09-15 DIAGNOSIS — Z8673 Personal history of transient ischemic attack (TIA), and cerebral infarction without residual deficits: Secondary | ICD-10-CM | POA: Insufficient documentation

## 2013-09-15 DIAGNOSIS — Z79899 Other long term (current) drug therapy: Secondary | ICD-10-CM | POA: Diagnosis not present

## 2013-09-15 HISTORY — PX: MASS EXCISION: SHX2000

## 2013-09-15 SURGERY — EXCISION MASS
Anesthesia: General | Site: Arm Upper | Laterality: Left

## 2013-09-15 MED ORDER — NEOSTIGMINE METHYLSULFATE 10 MG/10ML IV SOLN
INTRAVENOUS | Status: AC
  Filled 2013-09-15: qty 1

## 2013-09-15 MED ORDER — LIDOCAINE HCL (CARDIAC) 20 MG/ML IV SOLN
INTRAVENOUS | Status: DC | PRN
  Administered 2013-09-15: 50 mg via INTRAVENOUS

## 2013-09-15 MED ORDER — CHLORHEXIDINE GLUCONATE 4 % EX LIQD
1.0000 "application " | Freq: Once | CUTANEOUS | Status: DC
  Filled 2013-09-15: qty 15

## 2013-09-15 MED ORDER — FENTANYL CITRATE 0.05 MG/ML IJ SOLN: 25.0000 ug | INTRAMUSCULAR | Status: DC | PRN

## 2013-09-15 MED ORDER — BUPIVACAINE-EPINEPHRINE 0.5% -1:200000 IJ SOLN
INTRAMUSCULAR | Status: DC | PRN
  Administered 2013-09-15: 10 mL

## 2013-09-15 MED ORDER — ONDANSETRON HCL 4 MG/2ML IJ SOLN
INTRAMUSCULAR | Status: AC
  Filled 2013-09-15: qty 2

## 2013-09-15 MED ORDER — ONDANSETRON HCL 4 MG/2ML IJ SOLN
INTRAMUSCULAR | Status: DC | PRN
  Administered 2013-09-15: 4 mg via INTRAVENOUS

## 2013-09-15 MED ORDER — GLYCOPYRROLATE 0.2 MG/ML IJ SOLN
INTRAMUSCULAR | Status: AC
  Filled 2013-09-15: qty 2

## 2013-09-15 MED ORDER — OXYCODONE HCL 5 MG PO TABS: 5.0000 mg | ORAL_TABLET | Freq: Four times a day (QID) | ORAL | Status: DC | PRN

## 2013-09-15 MED ORDER — ROCURONIUM BROMIDE 50 MG/5ML IV SOLN
INTRAVENOUS | Status: AC
  Filled 2013-09-15: qty 1

## 2013-09-15 MED ORDER — MIDAZOLAM HCL 5 MG/5ML IJ SOLN
INTRAMUSCULAR | Status: DC | PRN
  Administered 2013-09-15: 1 mg via INTRAVENOUS

## 2013-09-15 MED ORDER — ARTIFICIAL TEARS OP OINT
TOPICAL_OINTMENT | OPHTHALMIC | Status: AC
Start: 2013-09-15 — End: 2013-09-15
  Filled 2013-09-15: qty 3.5

## 2013-09-15 MED ORDER — PROPOFOL 10 MG/ML IV BOLUS
INTRAVENOUS | Status: AC
  Filled 2013-09-15: qty 20

## 2013-09-15 MED ORDER — LACTATED RINGERS IV SOLN
INTRAVENOUS | Status: DC | PRN
  Administered 2013-09-15: 09:00:00 via INTRAVENOUS

## 2013-09-15 MED ORDER — FENTANYL CITRATE 0.05 MG/ML IJ SOLN
INTRAMUSCULAR | Status: DC | PRN
  Administered 2013-09-15: 25 ug via INTRAVENOUS
  Administered 2013-09-15: 50 ug via INTRAVENOUS

## 2013-09-15 MED ORDER — MIDAZOLAM HCL 2 MG/2ML IJ SOLN
INTRAMUSCULAR | Status: AC
  Filled 2013-09-15: qty 2

## 2013-09-15 MED ORDER — FENTANYL CITRATE 0.05 MG/ML IJ SOLN
INTRAMUSCULAR | Status: AC
  Filled 2013-09-15: qty 5

## 2013-09-15 MED ORDER — PROPOFOL 10 MG/ML IV BOLUS
INTRAVENOUS | Status: DC | PRN
  Administered 2013-09-15: 150 mg via INTRAVENOUS

## 2013-09-15 MED ORDER — 0.9 % SODIUM CHLORIDE (POUR BTL) OPTIME
TOPICAL | Status: DC | PRN
  Administered 2013-09-15: 1000 mL

## 2013-09-15 MED ORDER — LACTATED RINGERS IV SOLN
INTRAVENOUS | Status: DC
  Administered 2013-09-15: 09:00:00 via INTRAVENOUS

## 2013-09-15 SURGICAL SUPPLY — 51 items
BLADE SURG 10 STRL SS (BLADE) ×2 IMPLANT
BLADE SURG 15 STRL LF DISP TIS (BLADE) ×1 IMPLANT
BLADE SURG 15 STRL SS (BLADE) ×1
CANISTER SUCTION 2500CC (MISCELLANEOUS) IMPLANT
CHLORAPREP W/TINT 26ML (MISCELLANEOUS) ×2 IMPLANT
CLEANER TIP ELECTROSURG 2X2 (MISCELLANEOUS) IMPLANT
COVER SURGICAL LIGHT HANDLE (MISCELLANEOUS) ×2 IMPLANT
DECANTER SPIKE VIAL GLASS SM (MISCELLANEOUS) IMPLANT
DERMABOND ADVANCED (GAUZE/BANDAGES/DRESSINGS) ×1
DERMABOND ADVANCED .7 DNX12 (GAUZE/BANDAGES/DRESSINGS) ×1 IMPLANT
DRAPE LAPAROTOMY T 102X78X121 (DRAPES) IMPLANT
DRAPE PED LAPAROTOMY (DRAPES) ×2 IMPLANT
DRAPE PROXIMA HALF (DRAPES) ×2 IMPLANT
DRAPE UTILITY 15X26 W/TAPE STR (DRAPE) ×4 IMPLANT
ELECT CAUTERY BLADE 6.4 (BLADE) ×2 IMPLANT
ELECT REM PT RETURN 9FT ADLT (ELECTROSURGICAL) ×2
ELECTRODE REM PT RTRN 9FT ADLT (ELECTROSURGICAL) ×1 IMPLANT
GAUZE SPONGE 4X4 16PLY XRAY LF (GAUZE/BANDAGES/DRESSINGS) IMPLANT
GLOVE BIO SURGEON STRL SZ7.5 (GLOVE) ×2 IMPLANT
GLOVE BIO SURGEON STRL SZ8 (GLOVE) ×2 IMPLANT
GLOVE BIOGEL PI IND STRL 7.0 (GLOVE) ×1 IMPLANT
GLOVE BIOGEL PI IND STRL 7.5 (GLOVE) ×2 IMPLANT
GLOVE BIOGEL PI IND STRL 8 (GLOVE) ×1 IMPLANT
GLOVE BIOGEL PI INDICATOR 7.0 (GLOVE) ×1
GLOVE BIOGEL PI INDICATOR 7.5 (GLOVE) ×2
GLOVE BIOGEL PI INDICATOR 8 (GLOVE) ×1
GLOVE SURG SS PI 7.0 STRL IVOR (GLOVE) ×2 IMPLANT
GOWN STRL REUS W/ TWL LRG LVL3 (GOWN DISPOSABLE) ×2 IMPLANT
GOWN STRL REUS W/ TWL XL LVL3 (GOWN DISPOSABLE) ×1 IMPLANT
GOWN STRL REUS W/TWL LRG LVL3 (GOWN DISPOSABLE) ×2
GOWN STRL REUS W/TWL XL LVL3 (GOWN DISPOSABLE) ×1
KIT BASIN OR (CUSTOM PROCEDURE TRAY) ×2 IMPLANT
KIT ROOM TURNOVER OR (KITS) ×2 IMPLANT
NEEDLE HYPO 25X1 1.5 SAFETY (NEEDLE) ×2 IMPLANT
NS IRRIG 1000ML POUR BTL (IV SOLUTION) ×2 IMPLANT
PACK SURGICAL SETUP 50X90 (CUSTOM PROCEDURE TRAY) ×2 IMPLANT
PAD ARMBOARD 7.5X6 YLW CONV (MISCELLANEOUS) ×2 IMPLANT
PENCIL BUTTON HOLSTER BLD 10FT (ELECTRODE) ×2 IMPLANT
SPECIMEN JAR SMALL (MISCELLANEOUS) IMPLANT
SPONGE GAUZE 4X4 12PLY (GAUZE/BANDAGES/DRESSINGS) IMPLANT
SPONGE LAP 18X18 X RAY DECT (DISPOSABLE) ×2 IMPLANT
STRIP CLOSURE SKIN 1/2X4 (GAUZE/BANDAGES/DRESSINGS) IMPLANT
SUT MNCRL AB 4-0 PS2 18 (SUTURE) ×2 IMPLANT
SUT VIC AB 3-0 SH 27 (SUTURE) ×1
SUT VIC AB 3-0 SH 27X BRD (SUTURE) ×1 IMPLANT
SYR BULB 3OZ (MISCELLANEOUS) IMPLANT
SYR CONTROL 10ML LL (SYRINGE) ×2 IMPLANT
TOWEL OR 17X24 6PK STRL BLUE (TOWEL DISPOSABLE) IMPLANT
TOWEL OR 17X26 10 PK STRL BLUE (TOWEL DISPOSABLE) ×2 IMPLANT
TUBE CONNECTING 12X1/4 (SUCTIONS) IMPLANT
YANKAUER SUCT BULB TIP NO VENT (SUCTIONS) IMPLANT

## 2013-09-15 NOTE — Op Note (Addendum)
09/15/2013  10:29 AM  PATIENT:  Brittany Oliver  73 y.o. female  PRE-OPERATIVE DIAGNOSIS:  LEFT ARM MASS, HX MELANOMA  POST-OPERATIVE DIAGNOSIS:  LEFT ARM CYST, HX MELANOMA  PROCEDURE:  Procedure(s): EXCISION MASS LEFT UPPER ARM 1.5CM WITH LAYERED CLOSURE  SURGEON:  Surgeon(s): Zenovia Jarred, MD  ASSISTANTS: none   ANESTHESIA:   local and general  EBL:     BLOOD ADMINISTERED:none  DRAINS: none   SPECIMEN:  Excision  DISPOSITION OF SPECIMEN:  PATHOLOGY  COUNTS:  YES  DICTATION: .Dragon Dictation  Patient is status post wide excision melanoma left upper arm with sentinel lymph node biopsy for a T1BN0M0 melanoma. She developed a mass under the incision. She presents for excision of this mass. Informed consent was obtained. She was identified in the preop holding area. She received intravenous antibiotics. She was brought to the operating room and general anesthesia with laryngeal mask airway was administered by the anesthesia staff. Her arm was prepped and draped in sterile fashion. Local anesthetic was injected. Incision was made over the palpable mass. Subcutaneous tissues were dissected down and I entered a cyst. A large amount of clear fluid evacuated. This corresponded with the mass. The entirety of the cyst wall was excised. It was sitting right on top of her  muscle. Cyst was sent to pathology. Hemostasis was obtained. Additional local anesthetic was injected. Deep tissues were approximated with interrupted 3-0 Vicryl. Skin was closed with running 4-0 Monocryl subcuticular stitch, then Dermabond. All counts were correct. Patient tolerated procedure well without apparent complication was taken recovery in stable condition.  PATIENT DISPOSITION:  PACU - hemodynamically stable.   Delay start of Pharmacological VTE agent (>24hrs) due to surgical blood loss or risk of bleeding:  no  Georganna Skeans, MD, MPH, FACS Pager: [phone]  6/30/201510:29  AM

## 2013-09-15 NOTE — Discharge Instructions (Signed)
What to eat:  For your first meals, you should eat lightly; only small meals initially.  If you do not have nausea, you may eat larger meals.  Avoid spicy, greasy and heavy food.    General Anesthesia, Adult, Care After  Refer to this sheet in the next few weeks. These instructions provide you with information on caring for yourself after your procedure. Your health care provider may also give you more specific instructions. Your treatment has been planned according to current medical practices, but problems sometimes occur. Call your health care provider if you have any problems or questions after your procedure.  WHAT TO EXPECT AFTER THE PROCEDURE  After the procedure, it is typical to experience:  Sleepiness.  Nausea and vomiting. HOME CARE INSTRUCTIONS  For the first 24 hours after general anesthesia:  Have a responsible person with you.  Do not drive a car. If you are alone, do not take public transportation.  Do not drink alcohol.  Do not take medicine that has not been prescribed by your health care provider.  Do not sign important papers or make important decisions.  You may resume a normal diet and activities as directed by your health care provider.  Change bandages (dressings) as directed.  If you have questions or problems that seem related to general anesthesia, call the hospital and ask for the anesthetist or anesthesiologist on call. SEEK MEDICAL CARE IF:  You have nausea and vomiting that continue the day after anesthesia.  You develop a rash. SEEK IMMEDIATE MEDICAL CARE IF:  You have difficulty breathing.  You have chest pain.  You have any allergic problems. Document Released: 06/11/2000 Document Revised: 11/05/2012 Document Reviewed: 09/18/2012  [facility] Patient Information 2014 [place], [place].   Laceration Care, Adult    A laceration is a cut that goes through all layers of the skin. The cut goes into the tissue beneath the skin.  HOME CARE  For skin adhesive  strips:  Keep the cut clean and dry.  Do not get the strips wet. You may take a bath, but be careful to keep the cut dry.  If the cut gets wet, pat it dry with a clean towel.  The strips will fall off on their own. Do not remove the strips that are still stuck to the cut. For wound glue:  You may shower or take baths. Do not soak or scrub the cut. Do not swim. Avoid heavy sweating until the glue falls off on its own. After a shower or bath, pat the cut dry with a clean towel.  Do not put medicine on your cut until the glue falls off.  If you have a bandage, do not put tape over the glue.  Avoid lots of sunlight or tanning lamps until the glue falls off. Put sunscreen on the cut for the first year to reduce your scar.  The glue will fall off on its own. Do not pick at the glue. You may need a tetanus shot if:  You cannot remember when you had your last tetanus shot.  You have never had a tetanus shot. If you need a tetanus shot and you choose not to have one, you may get tetanus. Sickness from tetanus can be serious.  GET HELP RIGHT AWAY IF:  Your pain does not get better with medicine.  Your arm, hand, leg, or foot loses feeling (numbness) or changes color.  Your cut is bleeding.  Your joint feels weak, or you cannot use your  joint.  You have painful lumps on your body.  Your cut is red, puffy (swollen), or painful.  You have a red line on the skin near the cut.  You have yellowish-white fluid (pus) coming from the cut.  You have a fever.  You have a bad smell coming from the cut or bandage.  Your cut breaks open before or after stitches are removed.  You notice something coming out of the cut, such as wood or glass.  You cannot move a finger or toe. Document Released: 08/22/2007 Document Revised: 05/28/2011 Document Reviewed: 08/29/2010  [facility] Patient Information 2014 [place].

## 2013-09-15 NOTE — Anesthesia Preprocedure Evaluation (Signed)
Anesthesia Evaluation  Patient identified by MRN, date of birth, ID band Patient awake    Reviewed: Allergy & Precautions, H&P , NPO status , Patient's Chart, lab work & pertinent test results  Airway Mallampati: II      Dental   Pulmonary former smoker,          Cardiovascular hypertension,     Neuro/Psych    GI/Hepatic negative GI ROS,   Endo/Other  negative endocrine ROS  Renal/GU negative Renal ROS     Musculoskeletal   Abdominal   Peds  Hematology   Anesthesia Other Findings   Reproductive/Obstetrics                           Anesthesia Physical Anesthesia Plan  ASA: III  Anesthesia Plan: General   Post-op Pain Management:    Induction: Intravenous  Airway Management Planned: LMA  Additional Equipment:   Intra-op Plan:   Post-operative Plan: Extubation in OR  Informed Consent: I have reviewed the patients History and Physical, chart, labs and discussed the procedure including the risks, benefits and alternatives for the proposed anesthesia with the patient or authorized representative who has indicated his/her understanding and acceptance.   Dental advisory given  Plan Discussed with: Anesthesiologist and CRNA  Anesthesia Plan Comments:         Anesthesia Quick Evaluation

## 2013-09-15 NOTE — H&P (View-Only) (Signed)
Patient ID: Brittany Oliver, female   DOB: 19-Nov-1940, 73 y.o.   MRN: [id number]  No chief complaint on file.   HPI Brittany Oliver is a 73 y.o. female.  Chief complaint: Followup melanoma HPI Patient is status post wide excision melanoma left arm in January of 2015. She also had left axillary lymph node biopsy. She initially had some swelling along her incision. She presents for followup at three-month time period. She claims she has had a mass under her incision ever since surgery. She does not feel it has changed. It is not painful. She relates a story from a recent cruise when she had hypokalemia and was treated with IV infusion on the boat. No other complaints. Past Medical History  Diagnosis Date  . Hypertension   . Heart murmur   . Hyperlipidemia   . Stroke 1987    has a little balance issue since stroke  . Neuralgia facialis vera   . Cancer 02/2013    skin melanoma  . Arthritis     Past Surgical History  Procedure Laterality Date  . Finger surgery Right     middle finger  . Tonsillectomy    . Appendectomy    . Abdominal hysterectomy  1974    pt still has her uterus, took only 1 ovary and 1 tube  . Eye surgery Bilateral 2006    cataract and laser surgery  . Colonoscopy    . Dilation and curettage of uterus  1965  . Excision melanoma with sentinel lymph node biopsy Left 03/27/2013    Procedure: EXCISION MELANOMA LEFT POSTERIOR ARM WITH LEFT AXILLARY SENTINEL LYMPH NODE BIOPSY;  Surgeon: Zenovia Jarred, MD;  Location: [facility];  Service: General;  Laterality: Left;    Family History  Problem Relation Age of Onset  . Adopted: Yes  . Ataxia Neg Hx   . Chorea Neg Hx   . Dementia Neg Hx   . Mental retardation Neg Hx   . Migraines Neg Hx   . Multiple sclerosis Neg Hx   . Neurofibromatosis Neg Hx   . Neuropathy Neg Hx   . Parkinsonism Neg Hx   . Seizures Neg Hx   . Stroke Neg Hx     Social History History  Substance Use Topics  . Smoking status: Former  Smoker    Quit date: 03/23/2005  . Smokeless tobacco: Never Used  . Alcohol Use: 1.2 oz/week    2 Glasses of wine per week    No Known Allergies  Current Outpatient Prescriptions  Medication Sig Dispense Refill  . diclofenac (FLECTOR) 1.3 % PTCH Place 1 patch onto the skin daily as needed (for pain).      Marland Kitchen gabapentin (NEURONTIN) 100 MG capsule Take 1 capsule (100 mg total) by mouth 3 (three) times daily.  90 capsule  3  . gabapentin (NEURONTIN) 800 MG tablet Take 1 tablet (800 mg total) by mouth 3 (three) times daily.  90 tablet  3  . losartan-hydrochlorothiazide (HYZAAR) 100-25 MG per tablet Take 1 tablet by mouth daily.      . OXcarbazepine (TRILEPTAL) 150 MG tablet Take 1 tablet (150 mg total) by mouth 2 (two) times daily.  60 tablet  0  . pravastatin (PRAVACHOL) 80 MG tablet Take 80 mg by mouth daily.      . vitamin C (ASCORBIC ACID) 500 MG tablet Take 500 mg by mouth daily.       No current facility-administered medications for this visit.  Review of Systems Review of Systems  Constitutional: Negative.   HENT: Negative.   Eyes: Negative.   Respiratory: Negative.   Cardiovascular: Negative.   Gastrointestinal: Negative.   Endocrine: Negative.   Genitourinary: Negative.   Musculoskeletal:       See history of present illness  Skin: Negative.   Allergic/Immunologic: Negative.   Neurological: Negative.   Hematological: Negative.   Psychiatric/Behavioral: Negative.     There were no vitals taken for this visit.  Physical Exam Physical Exam  Constitutional: She is oriented to person, place, and time. She appears well-developed and well-nourished. No distress.  HENT:  Head: Normocephalic.  Eyes: EOM are normal. Pupils are equal, round, and reactive to light.  Neck: Normal range of motion. Neck supple.  Cardiovascular: Normal rate and normal heart sounds.   Pulmonary/Chest: Effort normal and breath sounds normal.  Abdominal: Soft. She exhibits no distension. There  is no tenderness.  Musculoskeletal:  Left axillary scar well healed with small palpable scar subcutaneously, left arm incision well-healed, distal portion of the incision has a subcutaneous nodule which is oval and 1.5 cm, nontender, no overlying skin changes  Neurological: She is alert and oriented to person, place, and time.  Skin: Skin is warm.    Assessment    Subcutaneous nodule at site of previous wide excision of melanoma L arm    Plan    I am concerned in light of her history. I feel this nodule needs to be excised. We will plan to excise it as an outpatient procedure. Procedure, risks, benefits were discussed in detail with the patient. She is agreeable.       Mathea Frieling E 08/26/2013, 10:43 AM

## 2013-09-15 NOTE — Anesthesia Postprocedure Evaluation (Signed)
  Anesthesia Post-op Note  Patient: Brittany Oliver  Procedure(s) Performed: Procedure(s): EXCISION MASS LEFT UPPER ARM (Left)  Patient Location: PACU  Anesthesia Type:General  Level of Consciousness: awake  Airway and Oxygen Therapy: Patient Spontanous Breathing  Post-op Pain: mild  Post-op Assessment: Post-op Vital signs reviewed  Post-op Vital Signs: Reviewed  Last Vitals:  Filed Vitals:   09/15/13 1105  BP: 139/58  Pulse: 74  Temp:   Resp: 15    Complications: No apparent anesthesia complications

## 2013-09-15 NOTE — Interval H&P Note (Signed)
History and Physical Interval Note:  09/15/2013 9:32 AM  Brittany Oliver  has presented today for surgery, with the diagnosis of LEFT ARM MASS, HX MELANOMA  The various methods of treatment have been discussed with the patient and family. After consideration of risks, benefits and other options for treatment, the patient has consented to  Procedure(s): EXCISION MASS LEFT UPPER ARM (Left) as a surgical intervention .  The patient's history has been reviewed, patient re-examined, site marked, no change in status, stable for surgery.  I have reviewed the patient's chart and labs.  Questions were answered to the patient's satisfaction.     Zacharie Portner E

## 2013-09-15 NOTE — Transfer of Care (Signed)
Immediate Anesthesia Transfer of Care Note  Patient: [place]  Procedure(s) Performed: Procedure(s): EXCISION MASS LEFT UPPER ARM (Left)  Patient Location: PACU  Anesthesia Type:General  Level of Consciousness: awake, alert  and oriented  Airway & Oxygen Therapy: Patient Spontanous Breathing and Patient connected to nasal cannula oxygen  Post-op Assessment: Report given to PACU RN  Post vital signs: Reviewed and stable  Complications: No apparent anesthesia complications

## 2013-09-16 ENCOUNTER — Encounter (HOSPITAL_COMMUNITY): Payer: Self-pay | Admitting: General Surgery

## 2013-09-16 ENCOUNTER — Telehealth (INDEPENDENT_AMBULATORY_CARE_PROVIDER_SITE_OTHER): Payer: Self-pay | Admitting: General Surgery

## 2013-09-16 NOTE — Telephone Encounter (Signed)
Left VM regarding pathology report

## 2013-09-17 ENCOUNTER — Telehealth (INDEPENDENT_AMBULATORY_CARE_PROVIDER_SITE_OTHER): Payer: Self-pay

## 2013-09-17 NOTE — Telephone Encounter (Signed)
LMOM for pt to call. Dr Grandville Silos will be out of office 2 weeks in July and the first avail appt will be the one pt has on 7-24. This date will be fine. If pt has wd concerns she should call and we can have her seen by one of our MDs.

## 2013-09-24 ENCOUNTER — Telehealth (INDEPENDENT_AMBULATORY_CARE_PROVIDER_SITE_OTHER): Payer: Self-pay

## 2013-09-24 ENCOUNTER — Encounter (INDEPENDENT_AMBULATORY_CARE_PROVIDER_SITE_OTHER): Payer: Self-pay | Admitting: General Surgery

## 2013-09-24 ENCOUNTER — Ambulatory Visit (INDEPENDENT_AMBULATORY_CARE_PROVIDER_SITE_OTHER): Payer: Medicare Other | Admitting: General Surgery

## 2013-09-24 VITALS — BP 132/72 | HR 76 | Temp 99.1°F | Resp 16 | Ht 63.0 in | Wt 130.8 lb

## 2013-09-24 DIAGNOSIS — IMO0001 Reserved for inherently not codable concepts without codable children: Secondary | ICD-10-CM

## 2013-09-24 DIAGNOSIS — Z79899 Other long term (current) drug therapy: Secondary | ICD-10-CM | POA: Diagnosis not present

## 2013-09-24 DIAGNOSIS — IMO0002 Reserved for concepts with insufficient information to code with codable children: Secondary | ICD-10-CM

## 2013-09-24 DIAGNOSIS — T888XXA Other specified complications of surgical and medical care, not elsewhere classified, initial encounter: Principal | ICD-10-CM

## 2013-09-24 DIAGNOSIS — T792XXA Traumatic secondary and recurrent hemorrhage and seroma, initial encounter: Principal | ICD-10-CM

## 2013-09-24 DIAGNOSIS — T887XXA Unspecified adverse effect of drug or medicament, initial encounter: Secondary | ICD-10-CM | POA: Diagnosis not present

## 2013-09-24 DIAGNOSIS — M069 Rheumatoid arthritis, unspecified: Secondary | ICD-10-CM | POA: Diagnosis not present

## 2013-09-24 MED ORDER — DOXYCYCLINE HYCLATE 100 MG PO TABS: 100.0000 mg | ORAL_TABLET | Freq: Two times a day (BID) | ORAL | Status: DC

## 2013-09-24 NOTE — Telephone Encounter (Signed)
Pt s/p excision of mass on left arm. Pt states that she has been experiencing a lot of pain, redness, and swelling in left arm. Pt states that this is very similar to what had happened before the excision. Pt states that she has been taking Ibuprofen with no relief. Pt denies any fevers or chills at this time. Appt was given for urgent office. Pt verbalized understanding.

## 2013-09-24 NOTE — Progress Notes (Signed)
Subjective:     Patient ID: Brittany Oliver, female   DOB: [birth year], 73 y.o.   MRN: [id number]  HPI Patient's history of melanoma excision from her left arm. She developed a nodule along the incision. We excised this nodule in the operating room and found to be a cyst. Pathology was benign. She has developed swelling in the area since then. She initially had some clear fluid drainage but now it has been swollen and a little red for the past couple days.  Review of Systems     Objective:   Physical Exam Swelling and mild erythema at the incision site consistent with possible seroma. This was aspirated with 18-gauge needle and clear fluid was returned. No purulence. Approximately 4 or 5 cc were evacuated.    Assessment:     Postoperative seroma status post excision of cyst from previous melanoma site    Plan:     Doxycycline twice a day For 7 days. I will have her return in a few days for recheck. I am out of the office so Dr. Donne Hazel will see her In urgent clinic. She has another appointment with me if needed later in the month. Plan was discussed in detail with her.

## 2013-09-25 DIAGNOSIS — Z1231 Encounter for screening mammogram for malignant neoplasm of breast: Secondary | ICD-10-CM | POA: Diagnosis not present

## 2013-09-28 ENCOUNTER — Ambulatory Visit (INDEPENDENT_AMBULATORY_CARE_PROVIDER_SITE_OTHER): Payer: Medicare Other | Admitting: General Surgery

## 2013-09-28 ENCOUNTER — Encounter (INDEPENDENT_AMBULATORY_CARE_PROVIDER_SITE_OTHER): Payer: Self-pay | Admitting: General Surgery

## 2013-09-28 VITALS — BP 122/76 | HR 72 | Temp 98.5°F | Ht 63.0 in | Wt 128.0 lb

## 2013-09-28 DIAGNOSIS — IMO0001 Reserved for inherently not codable concepts without codable children: Secondary | ICD-10-CM

## 2013-09-28 DIAGNOSIS — T792XXD Traumatic secondary and recurrent hemorrhage and seroma, subsequent encounter: Principal | ICD-10-CM

## 2013-09-28 DIAGNOSIS — Z5189 Encounter for other specified aftercare: Secondary | ICD-10-CM

## 2013-09-28 DIAGNOSIS — T888XXD Other specified complications of surgical and medical care, not elsewhere classified, subsequent encounter: Principal | ICD-10-CM

## 2013-09-28 DIAGNOSIS — IMO0002 Reserved for concepts with insufficient information to code with codable children: Secondary | ICD-10-CM

## 2013-09-28 NOTE — Progress Notes (Signed)
Subjective:     Patient ID: Brittany Oliver, female   DOB: 06-Mar-1941, 73 y.o.   MRN: [id number]  HPI 44 yof s/p melanoma excision from left arm complicated by a mass that was excised.  This has been complicated by a seroma that was aspirated by Dr Grandville Silos last week.  She returns today and this has recurred. No fevers. No drainage from wound  Review of Systems     Objective:   Physical Exam Healing incision with superficial dehiscence, no infection, seroma present    Assessment:     Postoperative seroma status post excision of cyst from previous melanoma site     Plan:     I aspirated 5 cc again today with 18 gauge needle.  I wrapped this.  She will see Dr Grandville Silos next week or call sooner if gets larger

## 2013-09-30 ENCOUNTER — Telehealth (INDEPENDENT_AMBULATORY_CARE_PROVIDER_SITE_OTHER): Payer: Self-pay

## 2013-09-30 DIAGNOSIS — Z1231 Encounter for screening mammogram for malignant neoplasm of breast: Secondary | ICD-10-CM | POA: Diagnosis not present

## 2013-09-30 DIAGNOSIS — R928 Other abnormal and inconclusive findings on diagnostic imaging of breast: Secondary | ICD-10-CM | POA: Diagnosis not present

## 2013-09-30 DIAGNOSIS — R922 Inconclusive mammogram: Secondary | ICD-10-CM | POA: Diagnosis not present

## 2013-09-30 NOTE — Telephone Encounter (Signed)
Pt was seen in the urg office on 7/13 by Dr Donne Hazel. Pt was calling today to see how long he wanted her to continue to wear her ace bandage for her seroma. Advised pt that he usually wants pts to wear for 1-2 weeks. Pt has a f/u appt with Dr Grandville Silos 7/24. Advised pt that she should wear this until she comes back to see him. Pt verbalized understanding and agrees with POC.

## 2013-10-09 ENCOUNTER — Ambulatory Visit (INDEPENDENT_AMBULATORY_CARE_PROVIDER_SITE_OTHER): Payer: Medicare Other | Admitting: General Surgery

## 2013-10-09 ENCOUNTER — Encounter (INDEPENDENT_AMBULATORY_CARE_PROVIDER_SITE_OTHER): Payer: Self-pay | Admitting: General Surgery

## 2013-10-09 VITALS — BP 118/72 | HR 76 | Temp 98.0°F | Ht 63.0 in | Wt 129.0 lb

## 2013-10-09 DIAGNOSIS — R229 Localized swelling, mass and lump, unspecified: Secondary | ICD-10-CM

## 2013-10-09 DIAGNOSIS — R2232 Localized swelling, mass and lump, left upper limb: Secondary | ICD-10-CM

## 2013-10-09 NOTE — Progress Notes (Signed)
Subjective:     Patient ID: Brittany Oliver, female   DOB: 1940-08-21, 73 y.o.   MRN: [id number]  HPI Patient presents status post excision of mass left arm at previous melanoma excision site. This was a benign cyst. She developed a seroma postoperatively. I drained this in the office on her last visit. She is doing much better.  Review of Systems     Objective:   Physical Exam Incision is healing well. Minimal Palpable scar tissue without significant seroma, no evidence of infection     Assessment:     Improved    Plan:     Return as needed

## 2013-10-12 ENCOUNTER — Ambulatory Visit: Payer: Medicare Other | Admitting: Neurology

## 2013-10-19 DIAGNOSIS — I10 Essential (primary) hypertension: Secondary | ICD-10-CM | POA: Diagnosis not present

## 2013-10-19 DIAGNOSIS — M069 Rheumatoid arthritis, unspecified: Secondary | ICD-10-CM | POA: Diagnosis not present

## 2013-10-19 DIAGNOSIS — D510 Vitamin B12 deficiency anemia due to intrinsic factor deficiency: Secondary | ICD-10-CM | POA: Diagnosis not present

## 2013-10-19 DIAGNOSIS — J449 Chronic obstructive pulmonary disease, unspecified: Secondary | ICD-10-CM | POA: Diagnosis not present

## 2013-10-19 DIAGNOSIS — E782 Mixed hyperlipidemia: Secondary | ICD-10-CM | POA: Diagnosis not present

## 2013-10-21 ENCOUNTER — Encounter (INDEPENDENT_AMBULATORY_CARE_PROVIDER_SITE_OTHER): Payer: Self-pay

## 2013-11-03 ENCOUNTER — Ambulatory Visit (INDEPENDENT_AMBULATORY_CARE_PROVIDER_SITE_OTHER): Payer: Medicare Other | Admitting: Neurology

## 2013-11-03 ENCOUNTER — Encounter: Payer: Self-pay | Admitting: Neurology

## 2013-11-03 VITALS — BP 118/64 | HR 70 | Temp 98.1°F | Resp 16 | Wt 132.0 lb

## 2013-11-03 DIAGNOSIS — G501 Atypical facial pain: Secondary | ICD-10-CM | POA: Diagnosis not present

## 2013-11-03 NOTE — Progress Notes (Signed)
NEUROLOGY FOLLOW UP OFFICE NOTE  Brittany Oliver [id number]  HISTORY OF PRESENT ILLNESS: Brittany Oliver is a 73 year old right-handed woman with history of posterior circulation stroke, hypertension, and hypercholesterolemia who follows up for atypical left-sided facial pain.    UPDATE: Current medication:  gabapentin 900mg  three times daily.  She is doing well.  Pain is under control.  Of note, she was recently diagnosed with melanoma of the left arm.  HISTORY: She began experiencing left-sided facial pain in 2006.  She reports a fairly constant burning and pins and needles sensation in the left V1 and V2 distribution.  Light touch, cold and wind can trigger a shooting pain down the face, but there is no spontaneous paroxysmal shooting pain. Eating and brushing her teeth does not aggravate it.  She says heat seems to help relieve it.  She was eventually diagnosed with left trigeminal neuralgia in 2012.  Past medications:  Trileptal (caused hyponatremia) Other therapy:  Gamma Knife x2 (ineffective).  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  She does have a history of brainstem stroke in 1987, in which she presented with left oculomotor weakness, left rotatory nystagmus, left facial numbness and dysfunction of her right arm and leg.  She still has residual numbness of the left face.  PAST MEDICAL HISTORY: Past Medical History  Diagnosis Date  . Hypertension   . Heart murmur   . Hyperlipidemia   . Stroke 1987    has a little balance issue since stroke  . Neuralgia facialis vera   . Cancer 02/2013    skin melanoma  . Arthritis     MEDICATIONS: Current Outpatient Prescriptions on File Prior to Visit  Medication Sig Dispense Refill  . fluticasone (FLONASE) 50 MCG/ACT nasal spray Place 1 spray into  both nostrils daily.      Marland Kitchen gabapentin (NEURONTIN) 100 MG capsule Take 100 mg by mouth 3 (three) times daily.      Marland Kitchen gabapentin (NEURONTIN) 800 MG tablet Take 800 mg by mouth 3 (three) times daily.      Marland Kitchen losartan-hydrochlorothiazide (HYZAAR) 100-25 MG per tablet Take 1 tablet by mouth daily.      . Omega-3 Fatty Acids (FISH OIL PO) Take by mouth.      . pravastatin (PRAVACHOL) 80 MG tablet Take 80 mg by mouth daily.      . vitamin C (ASCORBIC ACID) 500 MG tablet Take 500 mg by mouth daily.      Marland Kitchen doxycycline (VIBRA-TABS) 100 MG tablet Take 1 tablet (100 mg total) by mouth 2 (two) times daily.  14 tablet  0   No current facility-administered medications on file prior to visit.    ALLERGIES: No Known Allergies  FAMILY HISTORY: Family History  Problem Relation Age of Onset  . Adopted: Yes  . Ataxia Neg Hx   . Chorea Neg Hx   . Dementia Neg Hx   . Mental retardation Neg Hx   . Migraines Neg Hx   . Multiple sclerosis Neg Hx   . Neurofibromatosis Neg Hx   . Neuropathy Neg Hx   . Parkinsonism Neg Hx   . Seizures Neg Hx   . Stroke Neg Hx     SOCIAL HISTORY: History   Social History  . Marital Status: Married    Spouse Name: N/A    Number of Children:  N/A  . Years of Education: N/A   Occupational History  . Not on file.   Social History Main Topics  . Smoking status: Former Smoker -- 0.50 packs/day for 43.2 years    Types: Cigarettes    Quit date: 03/23/2005  . Smokeless tobacco: Never Used  . Alcohol Use: 1.2 oz/week    2 Glasses of wine per week  . Drug Use: No  . Sexual Activity: Not on file   Other Topics Concern  . Not on file   Social History Narrative  . No narrative on file    REVIEW OF SYSTEMS: Constitutional: No fevers, chills, or sweats, no generalized fatigue, change in appetite Eyes: No visual changes, double vision, eye pain Ear, nose and throat: No hearing loss, ear pain, nasal congestion, sore throat Cardiovascular: No chest pain,  palpitations Respiratory:  No shortness of breath at rest or with exertion, wheezes GastrointestinaI: No nausea, vomiting, diarrhea, abdominal pain, fecal incontinence Genitourinary:  No dysuria, urinary retention or frequency Musculoskeletal:  No neck pain, back pain Integumentary: No rash, pruritus, skin lesions Neurological: as above Psychiatric: No depression, insomnia, anxiety Endocrine: No palpitations, fatigue, diaphoresis, mood swings, change in appetite, change in weight, increased thirst Hematologic/Lymphatic:  No anemia, purpura, petechiae. Allergic/Immunologic: no itchy/runny eyes, nasal congestion, recent allergic reactions, rashes  PHYSICAL EXAM: BP 118/64, HR 70, RR 16, T 98.56F General: No acute distress Head:  Normocephalic/atraumatic Neck: supple, no paraspinal tenderness, full range of motion Heart:  Regular rate and rhythm Lungs:  Clear to auscultation bilaterally Back: No paraspinal tenderness Neurological Exam: alert and oriented to person, place, and time. Attention span and concentration intact, recent and remote memory intact, fund of knowledge intact. Speech fluent and not dysarthric, language intact. Mild left sided facial numbness. Otherwise CN II-XII intact. Fundoscopic exam unremarkable without vessel changes, exudates, hemorrhages or papilledema. Bulk and tone normal, muscle strength 5/5 throughout. Sensation to light touch, temperature and vibration intact. Deep tendon reflexes 2+ throughout, toes downgoing. Finger to nose intact. Gait normal, Romberg negative.  IMPRESSION: Atypical facial pain  PLAN: Continue gabapentin 900mg  three times daily Follow up in one year or as needed  Metta Clines, DO  CC:  [name], MD

## 2013-11-03 NOTE — Patient Instructions (Signed)
Continue gabapentin 900mg  three times daily Follow up in one year or as needed.

## 2013-12-03 ENCOUNTER — Encounter: Payer: Self-pay | Admitting: Hematology & Oncology

## 2013-12-03 ENCOUNTER — Other Ambulatory Visit (HOSPITAL_BASED_OUTPATIENT_CLINIC_OR_DEPARTMENT_OTHER): Payer: Medicare Other | Admitting: Lab

## 2013-12-03 ENCOUNTER — Encounter: Payer: Medicare Other | Admitting: Family

## 2013-12-03 DIAGNOSIS — C4360 Malignant melanoma of unspecified upper limb, including shoulder: Secondary | ICD-10-CM | POA: Diagnosis not present

## 2013-12-03 DIAGNOSIS — C4362 Malignant melanoma of left upper limb, including shoulder: Secondary | ICD-10-CM

## 2013-12-03 LAB — CBC WITH DIFFERENTIAL (CANCER CENTER ONLY)
BASO#: 0 10*3/uL (ref 0.0–0.2)
BASO%: 0.6 % (ref 0.0–2.0)
EOS%: 3 % (ref 0.0–7.0)
Eosinophils Absolute: 0.1 10*3/uL (ref 0.0–0.5)
HCT: 38.7 % (ref 34.8–46.6)
HGB: 13 g/dL (ref 11.6–15.9)
LYMPH#: 0.9 10*3/uL (ref 0.9–3.3)
LYMPH%: 27 % (ref 14.0–48.0)
MCH: 31 pg (ref 26.0–34.0)
MCHC: 33.6 g/dL (ref 32.0–36.0)
MCV: 92 fL (ref 81–101)
MONO#: 0.5 10*3/uL (ref 0.1–0.9)
MONO%: 14.5 % — ABNORMAL HIGH (ref 0.0–13.0)
NEUT#: 1.9 10*3/uL (ref 1.5–6.5)
NEUT%: 54.9 % (ref 39.6–80.0)
Platelets: 162 10*3/uL (ref 145–400)
RBC: 4.19 10*6/uL (ref 3.70–5.32)
RDW: 12.2 % (ref 11.1–15.7)
WBC: 3.4 10*3/uL — ABNORMAL LOW (ref 3.9–10.0)

## 2013-12-03 LAB — COMPREHENSIVE METABOLIC PANEL
ALT: 15 U/L (ref 0–35)
AST: 18 U/L (ref 0–37)
Albumin: 4.2 g/dL (ref 3.5–5.2)
Alkaline Phosphatase: 60 U/L (ref 39–117)
BUN: 15 mg/dL (ref 6–23)
CO2: 33 mEq/L — ABNORMAL HIGH (ref 19–32)
Calcium: 9.2 mg/dL (ref 8.4–10.5)
Chloride: 94 mEq/L — ABNORMAL LOW (ref 96–112)
Creatinine, Ser: 0.72 mg/dL (ref 0.50–1.10)
Glucose, Bld: 99 mg/dL (ref 70–99)
Potassium: 4.2 mEq/L (ref 3.5–5.3)
Sodium: 134 mEq/L — ABNORMAL LOW (ref 135–145)
Total Bilirubin: 0.5 mg/dL (ref 0.2–1.2)
Total Protein: 6.6 g/dL (ref 6.0–8.3)

## 2013-12-03 LAB — LACTATE DEHYDROGENASE: LDH: 120 U/L (ref 94–250)

## 2013-12-04 ENCOUNTER — Telehealth: Payer: Self-pay | Admitting: Hematology & Oncology

## 2013-12-04 NOTE — Telephone Encounter (Signed)
Per Md and Rn to sch patient for 63months.  Patient sch apt for 06/11/14.  Patient is aware of apt

## 2013-12-12 ENCOUNTER — Other Ambulatory Visit: Payer: Self-pay | Admitting: Neurology

## 2013-12-14 MED ORDER — GABAPENTIN 800 MG PO TABS: 800.0000 mg | ORAL_TABLET | Freq: Three times a day (TID) | ORAL | Status: DC

## 2013-12-15 MED ORDER — GABAPENTIN 800 MG PO TABS
800.0000 mg | ORAL_TABLET | Freq: Three times a day (TID) | ORAL | Status: DC
Start: 2013-12-15 — End: 2014-06-11

## 2013-12-15 NOTE — Addendum Note (Signed)
Addended byAnnamaria Helling on: 12/15/2013 01:18 PM   Modules accepted: Orders

## 2013-12-30 DIAGNOSIS — T50905A Adverse effect of unspecified drugs, medicaments and biological substances, initial encounter: Secondary | ICD-10-CM | POA: Diagnosis not present

## 2013-12-30 DIAGNOSIS — Z79899 Other long term (current) drug therapy: Secondary | ICD-10-CM | POA: Diagnosis not present

## 2013-12-30 DIAGNOSIS — M069 Rheumatoid arthritis, unspecified: Secondary | ICD-10-CM | POA: Diagnosis not present

## 2014-01-15 DIAGNOSIS — Z0000 Encounter for general adult medical examination without abnormal findings: Secondary | ICD-10-CM | POA: Diagnosis not present

## 2014-01-15 DIAGNOSIS — Z86010 Personal history of colonic polyps: Secondary | ICD-10-CM | POA: Diagnosis not present

## 2014-01-15 DIAGNOSIS — Z23 Encounter for immunization: Secondary | ICD-10-CM | POA: Diagnosis not present

## 2014-01-15 DIAGNOSIS — G500 Trigeminal neuralgia: Secondary | ICD-10-CM | POA: Diagnosis not present

## 2014-01-15 DIAGNOSIS — E785 Hyperlipidemia, unspecified: Secondary | ICD-10-CM | POA: Diagnosis not present

## 2014-01-15 DIAGNOSIS — I10 Essential (primary) hypertension: Secondary | ICD-10-CM | POA: Diagnosis not present

## 2014-01-15 DIAGNOSIS — M8580 Other specified disorders of bone density and structure, unspecified site: Secondary | ICD-10-CM | POA: Diagnosis not present

## 2014-01-18 ENCOUNTER — Ambulatory Visit: Payer: Medicare Other | Admitting: Hematology & Oncology

## 2014-01-18 ENCOUNTER — Other Ambulatory Visit: Payer: Medicare Other | Admitting: Lab

## 2014-02-16 DIAGNOSIS — R5383 Other fatigue: Secondary | ICD-10-CM | POA: Diagnosis not present

## 2014-02-16 DIAGNOSIS — M069 Rheumatoid arthritis, unspecified: Secondary | ICD-10-CM | POA: Diagnosis not present

## 2014-02-16 DIAGNOSIS — E559 Vitamin D deficiency, unspecified: Secondary | ICD-10-CM | POA: Diagnosis not present

## 2014-02-16 DIAGNOSIS — Z79899 Other long term (current) drug therapy: Secondary | ICD-10-CM | POA: Diagnosis not present

## 2014-02-16 DIAGNOSIS — T50905A Adverse effect of unspecified drugs, medicaments and biological substances, initial encounter: Secondary | ICD-10-CM | POA: Diagnosis not present

## 2014-02-16 DIAGNOSIS — M791 Myalgia: Secondary | ICD-10-CM | POA: Diagnosis not present

## 2014-03-02 ENCOUNTER — Other Ambulatory Visit: Payer: Self-pay | Admitting: Neurology

## 2014-03-02 DIAGNOSIS — L821 Other seborrheic keratosis: Secondary | ICD-10-CM | POA: Diagnosis not present

## 2014-03-02 DIAGNOSIS — Z85820 Personal history of malignant melanoma of skin: Secondary | ICD-10-CM | POA: Diagnosis not present

## 2014-03-03 ENCOUNTER — Other Ambulatory Visit: Payer: Self-pay | Admitting: *Deleted

## 2014-03-03 ENCOUNTER — Telehealth: Payer: Self-pay | Admitting: *Deleted

## 2014-03-03 MED ORDER — GABAPENTIN 300 MG PO CAPS: 900.0000 mg | ORAL_CAPSULE | Freq: Three times a day (TID) | ORAL | Status: DC

## 2014-03-03 NOTE — Telephone Encounter (Signed)
new rx for gabapentin called to pharmacy  300mg  3 capsules 3 times a day total 810 capsules for a 90 day supply with 5 refills

## 2014-03-17 ENCOUNTER — Other Ambulatory Visit: Payer: Self-pay | Admitting: *Deleted

## 2014-03-29 ENCOUNTER — Telehealth: Payer: Self-pay | Admitting: Neurology

## 2014-03-29 NOTE — Telephone Encounter (Signed)
Pt needs to talk to speak to someone about medication please call [phone]

## 2014-03-31 DIAGNOSIS — M069 Rheumatoid arthritis, unspecified: Secondary | ICD-10-CM | POA: Diagnosis not present

## 2014-03-31 DIAGNOSIS — T50905A Adverse effect of unspecified drugs, medicaments and biological substances, initial encounter: Secondary | ICD-10-CM | POA: Diagnosis not present

## 2014-03-31 DIAGNOSIS — Z79899 Other long term (current) drug therapy: Secondary | ICD-10-CM | POA: Diagnosis not present

## 2014-05-03 NOTE — Progress Notes (Signed)
Error

## 2014-05-19 DIAGNOSIS — Z79899 Other long term (current) drug therapy: Secondary | ICD-10-CM | POA: Diagnosis not present

## 2014-05-19 DIAGNOSIS — I10 Essential (primary) hypertension: Secondary | ICD-10-CM | POA: Diagnosis not present

## 2014-05-19 DIAGNOSIS — E782 Mixed hyperlipidemia: Secondary | ICD-10-CM | POA: Diagnosis not present

## 2014-05-19 DIAGNOSIS — M069 Rheumatoid arthritis, unspecified: Secondary | ICD-10-CM | POA: Diagnosis not present

## 2014-05-19 DIAGNOSIS — T50905A Adverse effect of unspecified drugs, medicaments and biological substances, initial encounter: Secondary | ICD-10-CM | POA: Diagnosis not present

## 2014-06-01 ENCOUNTER — Telehealth: Payer: Self-pay | Admitting: Neurology

## 2014-06-01 NOTE — Telephone Encounter (Signed)
Pt states that she needs a new rx faxed to [facility] at [phone]. teh name of the medication is the Gabapentin 300mg  90 supply she would like refills on that and she takes 9 pills aday. Her phone number is [phone]

## 2014-06-02 ENCOUNTER — Other Ambulatory Visit: Payer: Self-pay | Admitting: *Deleted

## 2014-06-02 MED ORDER — GABAPENTIN 300 MG PO CAPS: 900.0000 mg | ORAL_CAPSULE | Freq: Three times a day (TID) | ORAL | Status: DC

## 2014-06-02 NOTE — Telephone Encounter (Signed)
RX for Gabapentin 300 mg take total (900mg  ) TID total of 810 cap for a 3 mont supply and 2 refills faxed to [facility]  [phone]

## 2014-06-02 NOTE — Telephone Encounter (Signed)
Ok to refill 

## 2014-06-03 DIAGNOSIS — E559 Vitamin D deficiency, unspecified: Secondary | ICD-10-CM | POA: Diagnosis not present

## 2014-06-08 DIAGNOSIS — M0579 Rheumatoid arthritis with rheumatoid factor of multiple sites without organ or systems involvement: Secondary | ICD-10-CM | POA: Diagnosis not present

## 2014-06-08 DIAGNOSIS — Z79899 Other long term (current) drug therapy: Secondary | ICD-10-CM | POA: Diagnosis not present

## 2014-06-10 ENCOUNTER — Other Ambulatory Visit: Payer: Self-pay | Admitting: *Deleted

## 2014-06-10 DIAGNOSIS — C4362 Malignant melanoma of left upper limb, including shoulder: Secondary | ICD-10-CM

## 2014-06-11 ENCOUNTER — Ambulatory Visit (HOSPITAL_BASED_OUTPATIENT_CLINIC_OR_DEPARTMENT_OTHER): Payer: Medicare Other | Admitting: Hematology & Oncology

## 2014-06-11 ENCOUNTER — Encounter: Payer: Self-pay | Admitting: Hematology & Oncology

## 2014-06-11 ENCOUNTER — Other Ambulatory Visit (HOSPITAL_BASED_OUTPATIENT_CLINIC_OR_DEPARTMENT_OTHER): Payer: Medicare Other

## 2014-06-11 VITALS — BP 165/86 | HR 81 | Temp 98.2°F | Resp 16 | Ht 63.0 in | Wt 134.0 lb

## 2014-06-11 DIAGNOSIS — C4362 Malignant melanoma of left upper limb, including shoulder: Secondary | ICD-10-CM

## 2014-06-11 DIAGNOSIS — C4360 Malignant melanoma of unspecified upper limb, including shoulder: Secondary | ICD-10-CM | POA: Diagnosis not present

## 2014-06-11 DIAGNOSIS — Z85820 Personal history of malignant melanoma of skin: Secondary | ICD-10-CM

## 2014-06-11 LAB — COMPREHENSIVE METABOLIC PANEL
ALT: 13 U/L (ref 0–35)
AST: 17 U/L (ref 0–37)
Albumin: 4.2 g/dL (ref 3.5–5.2)
Alkaline Phosphatase: 60 U/L (ref 39–117)
BUN: 13 mg/dL (ref 6–23)
CO2: 28 mEq/L (ref 19–32)
Calcium: 9.4 mg/dL (ref 8.4–10.5)
Chloride: 90 mEq/L — ABNORMAL LOW (ref 96–112)
Creatinine, Ser: 0.72 mg/dL (ref 0.50–1.10)
Glucose, Bld: 100 mg/dL — ABNORMAL HIGH (ref 70–99)
Potassium: 4.6 mEq/L (ref 3.5–5.3)
Sodium: 130 mEq/L — ABNORMAL LOW (ref 135–145)
Total Bilirubin: 0.4 mg/dL (ref 0.2–1.2)
Total Protein: 6.5 g/dL (ref 6.0–8.3)

## 2014-06-11 LAB — CBC WITH DIFFERENTIAL (CANCER CENTER ONLY)
BASO#: 0 10*3/uL (ref 0.0–0.2)
BASO%: 0.5 % (ref 0.0–2.0)
EOS%: 3.4 % (ref 0.0–7.0)
Eosinophils Absolute: 0.1 10*3/uL (ref 0.0–0.5)
HCT: 37.7 % (ref 34.8–46.6)
HGB: 12.9 g/dL (ref 11.6–15.9)
LYMPH#: 1 10*3/uL (ref 0.9–3.3)
LYMPH%: 25.2 % (ref 14.0–48.0)
MCH: 30.9 pg (ref 26.0–34.0)
MCHC: 34.2 g/dL (ref 32.0–36.0)
MCV: 90 fL (ref 81–101)
MONO#: 0.4 10*3/uL (ref 0.1–0.9)
MONO%: 10.1 % (ref 0.0–13.0)
NEUT#: 2.3 10*3/uL (ref 1.5–6.5)
NEUT%: 60.8 % (ref 39.6–80.0)
Platelets: 179 10*3/uL (ref 145–400)
RBC: 4.18 10*6/uL (ref 3.70–5.32)
RDW: 11.7 % (ref 11.1–15.7)
WBC: 3.9 10*3/uL (ref 3.9–10.0)

## 2014-06-11 LAB — LACTATE DEHYDROGENASE: LDH: 138 U/L (ref 94–250)

## 2014-06-11 NOTE — Progress Notes (Signed)
Hematology and Oncology Follow Up Visit  Brittany Oliver [id number] 04/27/1940 74 y.o. 06/11/2014   Principle Diagnosis:  Stage IB melanoma left arm   Current Therapy:   Observation    Interim History:  Brittany Oliver is here today for a follow-up. She had a melanoma excised from her left under arm in January 2015. She also had her left axillary Sentinel node removed and it was negative. She has done well. Her underarm incision site healed nicely. She sees her dermatologist every 6 months. She has had no more suspicious lesions.  She denies problems with infections. No fever, chills, n/v, cough, rash, dizziness, SOB, chest pain, palpitations, abdominal pain, constipation, diarrhea, blood in urine or stool. She has had no swelling or tenderness in her extremities. No numbness or tingling.  Her appetite is good and she is staying hydrated.her weight is stable.      Medications:    Medication List       This list is accurate as of: 06/11/14 12:18 PM.  Always use your most recent med list.               cholecalciferol 1000 UNITS tablet  Commonly known as:  VITAMIN D  Take 2,000 Units by mouth daily.     FISH OIL PO  Take by mouth.     fluticasone 50 MCG/ACT nasal spray  Commonly known as:  FLONASE  Place 1 spray into both nostrils daily.     gabapentin 300 MG capsule  Commonly known as:  NEURONTIN  Take 3 capsules (900 mg total) by mouth 3 (three) times daily. Take 3 capsules  3 times a day     losartan-hydrochlorothiazide 100-25 MG per tablet  Commonly known as:  HYZAAR  Take 1 tablet by mouth daily.     pravastatin 80 MG tablet  Commonly known as:  PRAVACHOL  Take 80 mg by mouth daily.     vitamin C 500 MG tablet  Commonly known as:  ASCORBIC ACID  Take 500 mg by mouth daily.        Allergies: No Known Allergies  Past Medical History, Surgical history, Social history, and Family History were reviewed and updated.  Review of Systems: All other 10 point  review of systems is negative.   Physical Exam:  height is 5\' 3"  (1.6 m) and weight is 134 lb (60.782 kg). Her oral temperature is 98.2 F (36.8 C). Her blood pressure is 165/86 and her pulse is 81. Her respiration is 16.   Wt Readings from Last 3 Encounters:  06/11/14 134 lb (60.782 kg)  12/03/13 131 lb (59.421 kg)  11/03/13 132 lb (59.875 kg)    Ocular: Sclerae unicteric, pupils equal, round and reactive to light Ear-nose-throat: Oropharynx clear, dentition fair Lymphatic: No cervical or supraclavicular adenopathy Lungs no rales or rhonchi, good excursion bilaterally Heart regular rate and rhythm, no murmur appreciated Abd soft, nontender, positive bowel sounds MSK no focal spinal tenderness, no joint edema Neuro: non-focal, well-oriented, appropriate affect Breasts: Deferred  Lab Results  Component Value Date   WBC 3.9 06/11/2014   HGB 12.9 06/11/2014   HCT 37.7 06/11/2014   MCV 90 06/11/2014   PLT 179 06/11/2014   No results found for: FERRITIN, IRON, TIBC, UIBC, IRONPCTSAT Lab Results  Component Value Date   RBC 4.18 06/11/2014   No results found for: KPAFRELGTCHN, LAMBDASER, KAPLAMBRATIO No results found for: IGGSERUM, IGA, IGMSERUM No results found for: TOTALPROTELP, ALBUMINELP, A1GS, A2GS, BETS, BETA2SER, [place], MSPIKE, SPEI  Chemistry      Component Value Date/Time   NA 134* 12/03/2013 1343   NA 129 06/02/2013 1148   K 4.2 12/03/2013 1343   K 3.2* 06/02/2013 1148   CL 94* 12/03/2013 1343   CL 89* 06/02/2013 1148   CO2 33* 12/03/2013 1343   CO2 35* 06/02/2013 1148   BUN 15 12/03/2013 1343   BUN 12 06/02/2013 1148   CREATININE 0.72 12/03/2013 1343   CREATININE 0.5* 06/02/2013 1148      Component Value Date/Time   CALCIUM 9.2 12/03/2013 1343   CALCIUM 9.3 06/02/2013 1148   ALKPHOS 60 12/03/2013 1343   ALKPHOS 63 06/02/2013 1148   AST 18 12/03/2013 1343   AST 20 06/02/2013 1148   ALT 15 12/03/2013 1343   ALT 17 06/02/2013 1148   BILITOT 0.5  12/03/2013 1343   BILITOT 0.60 06/02/2013 1148     Impression and Plan: Brittany Oliver is a 74 year old white female with history of having a stage IB melanoma excised from the left arm in January 2015. She has done well since then and is also followed by dermatology seeing them every 6 months.  She has had no more suspicious lesions. She is wearing sunscreen and staying out of the sun.  Her CBC today was good. We will see what the rest of her lab work shows.  We will see her back in 1 year for follow-up and lab work.  She knows to call here with any questions or concerns. We can certainly see her sooner if need be.     Eliezer Bottom, NP 3/25/201612:18 PM

## 2014-06-15 ENCOUNTER — Telehealth: Payer: Self-pay | Admitting: Oncology

## 2014-06-15 DIAGNOSIS — M545 Low back pain: Secondary | ICD-10-CM | POA: Diagnosis not present

## 2014-06-15 NOTE — Telephone Encounter (Addendum)
-----   Message from Volanda Napoleon, MD sent at 06/11/2014  4:51 PM EDT ----- Call - labs look great!!  [facility]  Left voicemail message.

## 2014-07-20 DIAGNOSIS — Z79899 Other long term (current) drug therapy: Secondary | ICD-10-CM | POA: Diagnosis not present

## 2014-07-20 DIAGNOSIS — M0579 Rheumatoid arthritis with rheumatoid factor of multiple sites without organ or systems involvement: Secondary | ICD-10-CM | POA: Diagnosis not present

## 2014-07-20 DIAGNOSIS — T50905D Adverse effect of unspecified drugs, medicaments and biological substances, subsequent encounter: Secondary | ICD-10-CM | POA: Diagnosis not present

## 2014-08-30 DIAGNOSIS — H26491 Other secondary cataract, right eye: Secondary | ICD-10-CM | POA: Diagnosis not present

## 2014-08-30 DIAGNOSIS — H52203 Unspecified astigmatism, bilateral: Secondary | ICD-10-CM | POA: Diagnosis not present

## 2014-08-30 DIAGNOSIS — H5500 Unspecified nystagmus: Secondary | ICD-10-CM | POA: Diagnosis not present

## 2014-08-30 DIAGNOSIS — Z961 Presence of intraocular lens: Secondary | ICD-10-CM | POA: Diagnosis not present

## 2014-09-15 DIAGNOSIS — T50905D Adverse effect of unspecified drugs, medicaments and biological substances, subsequent encounter: Secondary | ICD-10-CM | POA: Diagnosis not present

## 2014-09-15 DIAGNOSIS — M0579 Rheumatoid arthritis with rheumatoid factor of multiple sites without organ or systems involvement: Secondary | ICD-10-CM | POA: Diagnosis not present

## 2014-09-15 DIAGNOSIS — Z79899 Other long term (current) drug therapy: Secondary | ICD-10-CM | POA: Diagnosis not present

## 2014-09-15 DIAGNOSIS — D1801 Hemangioma of skin and subcutaneous tissue: Secondary | ICD-10-CM | POA: Diagnosis not present

## 2014-09-15 DIAGNOSIS — D485 Neoplasm of uncertain behavior of skin: Secondary | ICD-10-CM | POA: Diagnosis not present

## 2014-09-15 DIAGNOSIS — D225 Melanocytic nevi of trunk: Secondary | ICD-10-CM | POA: Diagnosis not present

## 2014-09-15 DIAGNOSIS — L821 Other seborrheic keratosis: Secondary | ICD-10-CM | POA: Diagnosis not present

## 2014-09-15 DIAGNOSIS — Z85820 Personal history of malignant melanoma of skin: Secondary | ICD-10-CM | POA: Diagnosis not present

## 2014-09-29 DIAGNOSIS — M8580 Other specified disorders of bone density and structure, unspecified site: Secondary | ICD-10-CM | POA: Diagnosis not present

## 2014-09-29 DIAGNOSIS — Z1231 Encounter for screening mammogram for malignant neoplasm of breast: Secondary | ICD-10-CM | POA: Diagnosis not present

## 2014-10-26 ENCOUNTER — Ambulatory Visit (INDEPENDENT_AMBULATORY_CARE_PROVIDER_SITE_OTHER): Payer: Medicare Other | Admitting: Neurology

## 2014-10-26 ENCOUNTER — Encounter: Payer: Self-pay | Admitting: Neurology

## 2014-10-26 VITALS — BP 126/70 | HR 78 | Resp 16 | Ht 63.0 in | Wt 133.4 lb

## 2014-10-26 DIAGNOSIS — G501 Atypical facial pain: Secondary | ICD-10-CM

## 2014-10-26 DIAGNOSIS — R0600 Dyspnea, unspecified: Secondary | ICD-10-CM | POA: Diagnosis not present

## 2014-10-26 DIAGNOSIS — I10 Essential (primary) hypertension: Secondary | ICD-10-CM | POA: Diagnosis not present

## 2014-10-26 DIAGNOSIS — E785 Hyperlipidemia, unspecified: Secondary | ICD-10-CM | POA: Diagnosis not present

## 2014-10-26 NOTE — Progress Notes (Signed)
NEUROLOGY FOLLOW UP OFFICE NOTE  [name] [id number]  HISTORY OF PRESENT ILLNESS: Brittany Oliver is a 74 year old right-handed woman with history of posterior circulation stroke, hypertension, and hypercholesterolemia who follows up for atypical left-sided facial pain.    UPDATE: Current medication:  gabapentin 900mg  three times daily.  She always notes that the left side of her face feels different but pain is well-controlled.  She is tolerating the gabapentin.  HISTORY: She began experiencing left-sided facial pain in 2006.  She reports a fairly constant burning and pins and needles sensation in the left V1 and V2 distribution.  Light touch, cold and wind can trigger a shooting pain down the face, but there is no spontaneous paroxysmal shooting pain. Eating and brushing her teeth does not aggravate it.  She says heat seems to help relieve it.  She was eventually diagnosed with left trigeminal neuralgia in 2012.  Past medications:  Trileptal (caused hyponatremia) Other therapy:  Gamma Knife x2 (ineffective).  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  She does have a history of brainstem stroke in 1987, in which she presented with left oculomotor weakness, left rotatory nystagmus, left facial numbness and dysfunction of her right arm and leg.  She still has residual numbness of the left face.  PAST MEDICAL HISTORY: Past Medical History  Diagnosis Date  . Hypertension   . Heart murmur   . Hyperlipidemia   . Stroke 1987    has a little balance issue since stroke  . Neuralgia facialis vera   . Cancer 02/2013    skin melanoma  . Arthritis     MEDICATIONS: Current Outpatient Prescriptions on File Prior to Visit  Medication Sig Dispense Refill  . fluticasone (FLONASE) 50 MCG/ACT nasal spray Place 1  spray into both nostrils daily.    Marland Kitchen gabapentin (NEURONTIN) 300 MG capsule Take 3 capsules (900 mg total) by mouth 3 (three) times daily. Take 3 capsules  3 times a day (Patient taking differently: Take 900 mg by mouth 3 (three) times daily. TAKES 900 MG CAPS 3 TIMES A DAY = 2700 MG DAILY) 810 capsule 5  . losartan-hydrochlorothiazide (HYZAAR) 100-25 MG per tablet Take 1 tablet by mouth daily.    . pravastatin (PRAVACHOL) 80 MG tablet Take 80 mg by mouth daily.    . vitamin C (ASCORBIC ACID) 500 MG tablet Take 500 mg by mouth daily.    . cholecalciferol (VITAMIN D) 1000 UNITS tablet Take 2,000 Units by mouth daily.    . Omega-3 Fatty Acids (FISH OIL PO) Take by mouth.     No current facility-administered medications on file prior to visit.    ALLERGIES: No Known Allergies  FAMILY HISTORY: Family History  Problem Relation Age of Onset  . Adopted: Yes  . Ataxia Neg Hx   . Chorea Neg Hx   . Dementia Neg Hx   . Mental retardation Neg Hx   . Migraines Neg Hx   . Multiple sclerosis Neg Hx   . Neurofibromatosis Neg Hx   . Neuropathy Neg Hx   . Parkinsonism Neg Hx   . Seizures Neg Hx   . Stroke Neg Hx     SOCIAL HISTORY: History   Social History  . Marital Status: Married    Spouse Name: N/A  . Number of Children: N/A  . Years of  Education: N/A   Occupational History  . Not on file.   Social History Main Topics  . Smoking status: Former Smoker -- 0.50 packs/day for 43.2 years    Types: Cigarettes    Quit date: 03/23/2005  . Smokeless tobacco: Never Used     Comment: QUIT 9 YEARS AGO  . Alcohol Use: 1.2 oz/week    2 Glasses of wine per week  . Drug Use: No  . Sexual Activity:    Partners: Male   Other Topics Concern  . Not on file   Social History Narrative    REVIEW OF SYSTEMS: Constitutional: No fevers, chills, or sweats, no generalized fatigue, change in appetite Eyes: No visual changes, double vision, eye pain Ear, nose and throat: No hearing loss, ear pain,  nasal congestion, sore throat Cardiovascular: No chest pain, palpitations Respiratory:  No shortness of breath at rest or with exertion, wheezes GastrointestinaI: No nausea, vomiting, diarrhea, abdominal pain, fecal incontinence Genitourinary:  No dysuria, urinary retention or frequency Musculoskeletal:  No neck pain, back pain Integumentary: No rash, pruritus, skin lesions Neurological: as above Psychiatric: No depression, insomnia, anxiety Endocrine: No palpitations, fatigue, diaphoresis, mood swings, change in appetite, change in weight, increased thirst Hematologic/Lymphatic:  No anemia, purpura, petechiae. Allergic/Immunologic: no itchy/runny eyes, nasal congestion, recent allergic reactions, rashes  PHYSICAL EXAM: Filed Vitals:   10/26/14 1343  BP: 126/70  Pulse: 78  Resp: 16   General: No acute distress.  Patient appears well-groomed.  normal body habitus. Head:  Normocephalic/atraumatic Eyes:  Fundoscopic exam unremarkable without vessel changes, exudates, hemorrhages or papilledema. Neck: supple, no paraspinal tenderness, full range of motion Heart:  Regular rate and rhythm Lungs:  Clear to auscultation bilaterally Back: No paraspinal tenderness Neurological Exam: alert and oriented to person, place, and time. Attention span and concentration intact, recent and remote memory intact, fund of knowledge intact.  Speech fluent and not dysarthric, language intact.  Mild left sided facial numbness.  Otherwise CN II-XII intact. Fundoscopic exam unremarkable without vessel changes, exudates, hemorrhages or papilledema.  Bulk and tone normal, muscle strength 5/5 throughout.  Sensation to light touch, temperature and vibration intact.  Deep tendon reflexes 2+ throughout, toes downgoing.  Finger to nose testing intact.  Gait normal but stumbled a bit when she turned around, Romberg with sway.  IMPRESSION: Atypical left-sided facial pain, stable  PLAN: Gabapentin 900mg  three times  daily Follow up in one year  15 minutes spent face to face with patient, over 50% spent discussing management.  Metta Clines, DO  CC:  [name], MD

## 2014-10-26 NOTE — Patient Instructions (Signed)
Continue gabapentin 900mg  three times daily Follow up in one year

## 2014-11-10 DIAGNOSIS — M25511 Pain in right shoulder: Secondary | ICD-10-CM | POA: Diagnosis not present

## 2014-11-10 DIAGNOSIS — M25561 Pain in right knee: Secondary | ICD-10-CM | POA: Diagnosis not present

## 2014-11-24 DIAGNOSIS — M25511 Pain in right shoulder: Secondary | ICD-10-CM | POA: Diagnosis not present

## 2014-11-29 DIAGNOSIS — M25511 Pain in right shoulder: Secondary | ICD-10-CM | POA: Diagnosis not present

## 2014-12-01 DIAGNOSIS — M25511 Pain in right shoulder: Secondary | ICD-10-CM | POA: Diagnosis not present

## 2014-12-06 DIAGNOSIS — M25511 Pain in right shoulder: Secondary | ICD-10-CM | POA: Diagnosis not present

## 2014-12-13 DIAGNOSIS — M25511 Pain in right shoulder: Secondary | ICD-10-CM | POA: Diagnosis not present

## 2014-12-15 DIAGNOSIS — M25511 Pain in right shoulder: Secondary | ICD-10-CM | POA: Diagnosis not present

## 2014-12-17 DIAGNOSIS — I10 Essential (primary) hypertension: Secondary | ICD-10-CM | POA: Diagnosis not present

## 2014-12-17 DIAGNOSIS — Z23 Encounter for immunization: Secondary | ICD-10-CM | POA: Diagnosis not present

## 2014-12-17 DIAGNOSIS — E559 Vitamin D deficiency, unspecified: Secondary | ICD-10-CM | POA: Diagnosis not present

## 2014-12-17 DIAGNOSIS — R7302 Impaired glucose tolerance (oral): Secondary | ICD-10-CM | POA: Diagnosis not present

## 2014-12-17 DIAGNOSIS — E785 Hyperlipidemia, unspecified: Secondary | ICD-10-CM | POA: Diagnosis not present

## 2014-12-17 DIAGNOSIS — J449 Chronic obstructive pulmonary disease, unspecified: Secondary | ICD-10-CM | POA: Diagnosis not present

## 2014-12-20 DIAGNOSIS — M25511 Pain in right shoulder: Secondary | ICD-10-CM | POA: Diagnosis not present

## 2014-12-22 DIAGNOSIS — M25511 Pain in right shoulder: Secondary | ICD-10-CM | POA: Diagnosis not present

## 2014-12-22 DIAGNOSIS — M25561 Pain in right knee: Secondary | ICD-10-CM | POA: Diagnosis not present

## 2014-12-23 ENCOUNTER — Institutional Professional Consult (permissible substitution): Payer: Federal, State, Local not specified - PPO | Admitting: Internal Medicine

## 2014-12-23 ENCOUNTER — Institutional Professional Consult (permissible substitution): Payer: Federal, State, Local not specified - PPO | Admitting: Pulmonary Disease

## 2014-12-23 ENCOUNTER — Ambulatory Visit (INDEPENDENT_AMBULATORY_CARE_PROVIDER_SITE_OTHER): Payer: Medicare Other | Admitting: Pulmonary Disease

## 2014-12-23 VITALS — BP 132/80 | HR 78 | Temp 98.1°F | Ht 63.0 in | Wt 131.8 lb

## 2014-12-23 DIAGNOSIS — Z87891 Personal history of nicotine dependence: Secondary | ICD-10-CM | POA: Diagnosis not present

## 2014-12-23 DIAGNOSIS — C4362 Malignant melanoma of left upper limb, including shoulder: Secondary | ICD-10-CM | POA: Diagnosis not present

## 2014-12-23 DIAGNOSIS — R0600 Dyspnea, unspecified: Secondary | ICD-10-CM | POA: Insufficient documentation

## 2014-12-23 DIAGNOSIS — G501 Atypical facial pain: Secondary | ICD-10-CM

## 2014-12-23 NOTE — Patient Instructions (Signed)
Brittany Oliver-- it was nice meeting you today...  We are awaiting DrWhite's records to review...  Today we did a follow up CXR and we will sched a Complete Pulmonary Function Test at your convenience...    We will contact you w/ the results when available...   For now-- continue the Symbicort 2 sprays twice daily...    And increase your exercise program as we discussed...  Call for any questions or if we can be of service in any way.Marland KitchenMarland Kitchen

## 2014-12-30 ENCOUNTER — Ambulatory Visit
Admission: RE | Admit: 2014-12-30 | Discharge: 2014-12-30 | Disposition: A | Discharge status: Home / Self Care | Payer: Medicare Other | Source: Ambulatory Visit | Attending: Pulmonary Disease | Admitting: Pulmonary Disease

## 2014-12-30 DIAGNOSIS — R0602 Shortness of breath: Secondary | ICD-10-CM | POA: Diagnosis not present

## 2014-12-30 DIAGNOSIS — R0600 Dyspnea, unspecified: Secondary | ICD-10-CM

## 2015-01-11 DIAGNOSIS — E871 Hypo-osmolality and hyponatremia: Secondary | ICD-10-CM | POA: Diagnosis not present

## 2015-01-20 ENCOUNTER — Ambulatory Visit (INDEPENDENT_AMBULATORY_CARE_PROVIDER_SITE_OTHER): Payer: Medicare Other | Admitting: Pulmonary Disease

## 2015-01-20 DIAGNOSIS — R0600 Dyspnea, unspecified: Secondary | ICD-10-CM | POA: Diagnosis not present

## 2015-01-20 LAB — PULMONARY FUNCTION TEST
DL/VA % pred: 108 %
DL/VA: 5.06 ml/min/mmHg/L
DLCO unc % pred: 84 %
DLCO unc: 19.29 ml/min/mmHg
FEF 25-75 Post: 0.71 L/sec
FEF 25-75 Pre: 0.65 L/sec
FEF2575-%Change-Post: 9 %
FEF2575-%Pred-Post: 42 %
FEF2575-%Pred-Pre: 38 %
FEV1-%Change-Post: 12 %
FEV1-%Pred-Post: 61 %
FEV1-%Pred-Pre: 54 %
FEV1-Post: 1.27 L
FEV1-Pre: 1.13 L
FEV1FVC-%Change-Post: 14 %
FEV1FVC-%Pred-Pre: 66 %
FEV6-%Change-Post: -2 %
FEV6-%Pred-Post: 83 %
FEV6-%Pred-Pre: 85 %
FEV6-Post: 2.19 L
FEV6-Pre: 2.24 L
FEV6FVC-%Change-Post: -1 %
FEV6FVC-%Pred-Post: 102 %
FEV6FVC-%Pred-Pre: 104 %
FVC-%Change-Post: -1 %
FVC-%Pred-Post: 81 %
FVC-%Pred-Pre: 82 %
FVC-Post: 2.24 L
FVC-Pre: 2.27 L
Post FEV1/FVC ratio: 57 %
Post FEV6/FVC ratio: 98 %
Pre FEV1/FVC ratio: 50 %
Pre FEV6/FVC Ratio: 99 %
RV % pred: 121 %
RV: 2.69 L
TLC % pred: 99 %
TLC: 4.9 L

## 2015-01-20 NOTE — Progress Notes (Signed)
PFT done today. 

## 2015-01-25 ENCOUNTER — Telehealth: Payer: Self-pay | Admitting: Pulmonary Disease

## 2015-01-25 MED ORDER — TIOTROPIUM BROMIDE MONOHYDRATE 18 MCG IN CAPS: 18.0000 ug | ORAL_CAPSULE | Freq: Every day | RESPIRATORY_TRACT | Status: DC

## 2015-01-25 NOTE — Telephone Encounter (Signed)
Spoke with pt about results of PFT and rec of SN Pt voiced understanding of rec Pt requested that [facility] Pt scheduled to see SN in 64month per rec  Order sent electronically to pt's pharmacy  Nothing further is needed at this time

## 2015-01-29 ENCOUNTER — Encounter: Payer: Self-pay | Admitting: Pulmonary Disease

## 2015-01-29 NOTE — Progress Notes (Signed)
Subjective:     Patient ID: Brittany Oliver, female   DOB: 1940/08/03, 74 y.o.   MRN: [id number]  HPI ~  December 23, 2014:  Initial pulmonary consultation w/ SN>        73 y/o WF, referred by Dr. [name], [facility], for a pulmonary evaluation;  Brittany Oliver is c/o a 1-2 yr hx of SOB/ DOE when doing yard work, walking fast, or taking stairs;  She is an ex-smoker having started in her teens, smoked for 25-30 yrs up to 1ppd, and quit for the 1st time in 1987 when she had a stroke; then she restarted smoking up to 1/2ppd for another 20 yrs and quit for good in 2006 (?35-40 pack-yr total);  She notes little cough, denies sputum/ hemoptysis/ CP/ etc;  She denies any hx of signif resp illnesses> no prev bronchitis, pneumonia, asthma, TB or known exposure;  Similarly she denies exposure to asbestos, or any known toxins etc;  She was adopted and does not have any knowledge of genetic history;  She has been using Symbicort160- but not taking it regularly because she didn't notice much difference...    Current Meds>  On Symbicort160-2spBid (?if using regularly), ASA81, Hyzaar100-25, Pravastatin80, Neurontin300-3Tid...   EXAM shows Afeb, VSS, O2sat=95% on RA;  Heent- neg, mallampati1;  Chest- decr BS bilat but clear w/o w/r/r;  Heart- RR w/o m/r/g;    Old LABS in epic from 3/16>  Chems- wnl;  CBC- wnl   CXR 12/30/14 showed norm heart size, clear lungs w/ biapical pleural thickening, NAD (no change from old film 1/15).  Ambulatory oxygen saturation test 12/30/14> O2sat=96% on RA at rest w/ pulse=69/min; she ambulated only 2 laps & stopped w/ dyspnea & O2sat=87%    W/ pulse=103/min...  FullPFTs done 01/20/15 showed FVC=2.27 (82%), FEV1=1/13 (54%), %1sec=50, mid-flows are reduced at 38% predicted; there was a 12% improvement in FEV1 after a bronchodil;  TLC=4.90 (99%), RV=2.69 (121%), RV/TLC=55; DLCO=84% predicted... PFT is c/w a moderately severe ventilatory defect w/ mild air trapping, small revers  component, and normal diffusion...     IMP/PLAN>>  Brittany Oliver has mod severe COPD (c/w GOLD Stage2-3 disease) and a small reversible component;  She has finally quit smoking & we discussed the need for regular dosing of the Symbicort160- 2sp Bid and SPIRIVA daily;  It is hoped that regular dosing + an exercise program will result in some improvement over time... She should be sure that she is up to date on needed immunizations, & try to avoid infections... We will plan recheck in ~6wks.    Past Medical History  Diagnosis Date  . Hypertension >> controlled on Hyzaar   . Heart murmur >> she has an AbnEKG w/ poor R prog V1-3   . Hyperlipidemia >> on Prav80   . Stroke [facility]) 1987    has a little balance issue since stroke  . 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 Cancer ([place]) 02/2013    skin melanoma >> removed from left arm 1/15, Stage iB, followed by DrEnnever  . Arthritis    LBP >> she has had shots in the past    Osteopenia     Past Surgical History  Procedure Laterality Date  . Finger surgery Right     middle finger  . Tonsillectomy    . Appendectomy    . Abdominal hysterectomy  1974    pt still has her uterus, took only 1  ovary and 1 tube  . Eye surgery Bilateral 2006    cataract and laser surgery  . Colonoscopy    . Dilation and curettage of uterus  1965  . Excision melanoma with sentinel lymph node biopsy Left 03/27/2013    Procedure: EXCISION MELANOMA LEFT POSTERIOR ARM WITH LEFT AXILLARY SENTINEL LYMPH NODE BIOPSY;  Surgeon: Zenovia Jarred, MD;  Location: [place];  Service: General;  Laterality: Left;  Marland Kitchen Mass excision Left 09/15/2013    Procedure: EXCISION MASS LEFT UPPER ARM;  Surgeon: Zenovia Jarred, MD;  Location: [place];  Service: General;  Laterality: Left;    Outpatient Encounter Prescriptions as of 12/23/2014  Medication Sig  . aspirin (ASPIR-81) 81 MG EC tablet   . FLECTOR 1.3 % PTCH   . gabapentin (NEURONTIN) 300 MG  capsule Take 3 capsules (900 mg total) by mouth 3 (three) times daily. Take 3 capsules  3 times a day (Patient taking differently: Take 900 mg by mouth 3 (three) times daily. TAKES 900 MG CAPS 3 TIMES A DAY = 2700 MG DAILY)  . Omega-3 Fatty Acids (FISH OIL PO) Take by mouth.  . pravastatin (PRAVACHOL) 80 MG tablet Take 80 mg by mouth daily.  . SYMBICORT 160-4.5 MCG/ACT inhaler Inhale 2 puffs into the lungs 2 (two) times daily.  . vitamin C (ASCORBIC ACID) 500 MG tablet Take 500 mg by mouth daily.  . cholecalciferol (VITAMIN D) 1000 UNITS tablet Take 2,000 Units by mouth daily.  . fluticasone (FLONASE) 50 MCG/ACT nasal spray Place 1 spray into both nostrils daily.  Marland Kitchen losartan-hydrochlorothiazide (HYZAAR) 100-25 MG per tablet Take 1 tablet by mouth daily.   No facility-administered encounter medications on file as of 12/23/2014.    No Known Allergies   Family History  Problem Relation Age of Onset  . Adopted: Yes  . Ataxia Neg Hx   . Chorea Neg Hx   . Dementia Neg Hx   . Mental retardation Neg Hx   . Migraines Neg Hx   . Multiple sclerosis Neg Hx   . Neurofibromatosis Neg Hx   . Neuropathy Neg Hx   . Parkinsonism Neg Hx   . Seizures Neg Hx   . Stroke Neg Hx     Social History   Social History  . Marital Status: Married    Spouse Name: N/A  . Number of Children: N/A  . Years of Education: N/A   Occupational History  . Not on file.   Social History Main Topics  . Smoking status: Former Smoker -- 0.50 packs/day for 43.2 years    Types: Cigarettes    Quit date: 03/23/2005  . Smokeless tobacco: Never Used     Comment: QUIT 9 YEARS AGO  . Alcohol Use: 1.2 oz/week    2 Glasses of wine per week  . Drug Use: No  . Sexual Activity:    Partners: Male   Other Topics Concern  . Not on file   Social History Narrative    Current Medications, Allergies, Past Medical History, Past Surgical History, Family History, and Social History were reviewed in [employer] record.   Review of Systems             All symptoms NEG except where BOLDED >>  Constitutional:  F/C/S, fatigue, anorexia, unexpected weight change. HEENT:  HA, visual changes, hearing loss, earache, nasal symptoms, sore throat, mouth sores, hoarseness. Resp:  cough, sputum, hemoptysis; SOB, tightness, wheezing. Cardio:  CP, palpit, DOE, orthopnea, edema.  GI:  N/V/D/C, blood in stool; reflux, abd pain, distention, gas. GU:  dysuria, freq, urgency, hematuria, flank pain, voiding difficulty. MS:  joint pain, swelling, tenderness, decr ROM; neck pain, back pain, etc. Neuro:  HA, tremors, seizures, dizziness, syncope, weakness, numbness, gait abn. Skin:  suspicious lesions or skin rash. Heme:  adenopathy, bruising, bleeding. Psyche:  confusion, agitation, sleep disturbance, hallucinations, anxiety, depression suicidal.   Objective:   Physical Exam       Vital Signs:  Reviewed...  General:  WD, WN, 74 y/o WF in NAD; alert & oriented; pleasant & cooperative... HEENT:  [place]/AT; Conjunctiva- pink, Sclera- nonicteric, EOM-wnl, PERRLA, EACs-clear, TMs-wnl; NOSE-clear; THROAT-clear & wnl. Neck:  Supple w/ fair ROM; no JVD; normal carotid impulses w/o bruits; no thyromegaly or nodules palpated; no lymphadenopathy. Chest:  Decr BS at bases, clear- without wheezes, rales, or rhonchi heard. Heart:  Regular Rhythm; norm S1 & S2 without murmurs, rubs, or gallops detected. Abdomen:  Soft & nontender- no guarding or rebound; normal bowel sounds; no organomegaly or masses palpated. Ext:  Normal ROM; without deformities or arthritic changes; no varicose veins, venous insuffic, or edema;  Pulses intact w/o bruits. Neuro:  No focal neuro deficits; sensory testing normal; gait normal & balance OK. Derm:  No lesions noted; no rash etc. Lymph:  No cervical, supraclavicular, axillary, or inguinal adenopathy palpated.   Assessment:      IMP >>     COPD, former smoker, w/ mod severe airflow  obstruction, c/w GOLD Stage 2-3 COPD w/ smal  Revers component and normal DLCO...  PLAN >>     Brittany Oliver has mod severe COPD (c/w GOLD Stage2-3 disease) and a small reversible component;  She has finally quit smoking & we discussed the need for regular dosing of the Symbicort160- 2sp Bid and SPIRIVA daily;  It is hoped that regular dosing + an exercise program will result in some improvement over time... She should be sure that she is up to date on needed immunizations, & try to avoid infections     Plan:     Patient's Medications  New Prescriptions   TIOTROPIUM (SPIRIVA HANDIHALER) 18 MCG INHALATION CAPSULE    Place 1 capsule (18 mcg total) into inhaler and inhale daily.  Previous Medications   ASPIRIN (ASPIR-81) 81 MG EC TABLET       CHOLECALCIFEROL (VITAMIN D) 1000 UNITS TABLET    Take 2,000 Units by mouth daily.   FLECTOR 1.3 % PTCH       FLUTICASONE (FLONASE) 50 MCG/ACT NASAL SPRAY    Place 1 spray into both nostrils daily.   GABAPENTIN (NEURONTIN) 300 MG CAPSULE    Take 3 capsules (900 mg total) by mouth 3 (three) times daily. Take 3 capsules  3 times a day   LOSARTAN-HYDROCHLOROTHIAZIDE (HYZAAR) 100-25 MG PER TABLET    Take 1 tablet by mouth daily.   OMEGA-3 FATTY ACIDS (FISH OIL PO)    Take by mouth.   PRAVASTATIN (PRAVACHOL) 80 MG TABLET    Take 80 mg by mouth daily.   SYMBICORT 160-4.5 MCG/ACT INHALER    Inhale 2 puffs into the lungs 2 (two) times daily.   VITAMIN C (ASCORBIC ACID) 500 MG TABLET    Take 500 mg by mouth daily.  Modified Medications   No medications on file  Discontinued Medications   No medications on file

## 2015-02-15 ENCOUNTER — Other Ambulatory Visit: Payer: Self-pay | Admitting: Neurology

## 2015-03-02 ENCOUNTER — Encounter: Payer: Self-pay | Admitting: Pulmonary Disease

## 2015-03-02 ENCOUNTER — Ambulatory Visit (INDEPENDENT_AMBULATORY_CARE_PROVIDER_SITE_OTHER): Payer: Medicare Other | Admitting: Pulmonary Disease

## 2015-03-02 VITALS — BP 130/72 | HR 89 | Temp 98.2°F | Wt 133.4 lb

## 2015-03-02 DIAGNOSIS — Z87891 Personal history of nicotine dependence: Secondary | ICD-10-CM

## 2015-03-02 DIAGNOSIS — J449 Chronic obstructive pulmonary disease, unspecified: Secondary | ICD-10-CM

## 2015-03-02 NOTE — Progress Notes (Signed)
Subjective:     Patient ID: Brittany Oliver, female   DOB: November 01, 1940, 74 y.o.   MRN: [id number]  HPI  ~  December 23, 2014:  Initial pulmonary consultation w/ SN>        50 y/o WF, referred by Dr. [name], [facility], for a pulmonary evaluation;  [name] is c/o a 1-2 yr hx of SOB/ DOE when doing yard work, walking fast, or taking stairs;  She is an ex-smoker having started in her teens, smoked for 25-30 yrs up to 1ppd, and quit for the 1st time in 1987 when she had a stroke; then she restarted smoking up to 1/2ppd for another 20 yrs and quit for good in 2006 (?35-40 pack-yr total);  She notes little cough, denies sputum/ hemoptysis/ CP/ etc;  She denies any hx of signif resp illnesses> no prev bronchitis, pneumonia, asthma, TB or known exposure;  Similarly she denies exposure to asbestos, or any known toxins etc;  She was adopted and does not have any knowledge of genetic history;  She has been using Symbicort160- but not taking it regularly because she didn't notice much difference...    Current Meds>  On Symbicort160-2spBid (?if using regularly), ASA81, Hyzaar100-25, Pravastatin80, Neurontin300-3Tid...  EXAM shows Afeb, VSS, O2sat=95% on RA;  Heent- neg, mallampati1;  Chest- decr BS bilat but clear w/o w/r/r;  Heart- RR w/o m/r/g;    Old LABS in epic from 3/16>  Chems- wnl;  CBC- wnl   CXR 12/30/14 showed norm heart size, clear lungs w/ biapical pleural thickening, NAD (no change from old film 1/15).  Ambulatory oxygen saturation test 12/30/14> O2sat=96% on RA at rest w/ pulse=69/min; she ambulated only 2 laps & stopped w/ dyspnea & O2sat=87%    W/ pulse=103/min...  FullPFTs done 01/20/15 showed FVC=2.27 (82%), FEV1=1/13 (54%), %1sec=50, mid-flows are reduced at 38% predicted; there was a 12% improvement in FEV1 after a bronchodil;  TLC=4.90 (99%), RV=2.69 (121%), RV/TLC=55; DLCO=84% predicted... PFT is c/w a moderately severe ventilatory defect w/ mild air trapping, small revers  component, and normal diffusion...     IMP/PLAN>>  Everette has mod severe COPD (c/w GOLD Stage2-3 disease) and a small reversible component;  She has finally quit smoking & we discussed the need for regular dosing of the Symbicort160- 2sp Bid and SPIRIVA daily;  It is hoped that regular dosing + an exercise program will result in some improvement over time... She should be sure that she is up to date on needed immunizations, & try to avoid infections... We will plan recheck in ~6wks.  ~  March 02, 2015:  79mo ROV w/ SN>  [name] returns having started on her Symbicort & Spiriva and feeling better w/ less dyspnea, no cough/ sputum/ hemoptysis/ etc;  She notes that she can walk farther and carry out a conversation at the same time;  Stairs seem easier etc;  She is pleased w/ her progress...  EXAM shows Afeb, VSS, O2sat=96% on RA;  Heent- neg, mallampati1;  Chest- decr BS bilat but clear w/o w/r/r;  Heart- RR w/o m/r/g;  Abd- soft, nontender, neg;  Ext- neg w/o c/c/e;  Neuro- intact...  We reviewed her prev CXR, PFT, ambulatory oximetry... IMP?PLAN>>  COPD, former smoker, w/ mod severe airflow obstruction, c/w GOLD Stage 2-3 COPD w/ small revers component and normal DLCO;  She is rec to continue the Symbicort & Spiriva daily, stay active w/ exercise program, and as far as poss avoid infections... We plan ROV 4mo, sooner if  needed prn...    Past Medical History  Diagnosis Date  . Hypertension >> controlled on Hyzaar   . Heart murmur >> she has an AbnEKG w/ poor R prog V1-3   . Hyperlipidemia >> on Prav80   . Stroke [facility]) 1987    has a little balance issue since stroke  . Neuralgia facialis vera >> on Neurontin from [facility]; she had Gamma Knife Rx x2   . Cancer ([place]) 02/2013    skin melanoma >> removed from left arm 1/15, Stage iB, followed by DrEnnever  . Arthritis    LBP >> she has had shots in the past    Osteopenia     Past Surgical History  Procedure Laterality Date  . Finger surgery  Right     middle finger  . Tonsillectomy    . Appendectomy    . Abdominal hysterectomy  1974    pt still has her uterus, took only 1 ovary and 1 tube  . Eye surgery Bilateral 2006    cataract and laser surgery  . Colonoscopy    . Dilation and curettage of uterus  1965  . Excision melanoma with sentinel lymph node biopsy Left 03/27/2013    Procedure: EXCISION MELANOMA LEFT POSTERIOR ARM WITH LEFT AXILLARY SENTINEL LYMPH NODE BIOPSY;  Surgeon: Zenovia Jarred, MD;  Location: [place];  Service: General;  Laterality: Left;  Marland Kitchen Mass excision Left 09/15/2013    Procedure: EXCISION MASS LEFT UPPER ARM;  Surgeon: Zenovia Jarred, MD;  Location: [place];  Service: General;  Laterality: Left;    Outpatient Encounter Prescriptions as of 03/02/2015  Medication Sig  . aspirin (ASPIR-81) 81 MG EC tablet   . cholecalciferol (VITAMIN D) 1000 UNITS tablet Take 2,000 Units by mouth daily.  Marland Kitchen FLECTOR 1.3 % PTCH   . fluticasone (FLONASE) 50 MCG/ACT nasal spray Place 1 spray into both nostrils daily.  Marland Kitchen gabapentin (NEURONTIN) 300 MG capsule TAKE 3 CAPSULES (=900MG ) 3 TIMES A DAY (TOTAL OF      2700MG  DAILY)  . losartan-hydrochlorothiazide (HYZAAR) 100-12.5 MG tablet   . Omega-3 Fatty Acids (FISH OIL PO) Take by mouth.  . pravastatin (PRAVACHOL) 80 MG tablet Take 80 mg by mouth daily.  . SYMBICORT 160-4.5 MCG/ACT inhaler Inhale 2 puffs into the lungs 2 (two) times daily.  Marland Kitchen tiotropium (SPIRIVA HANDIHALER) 18 MCG inhalation capsule Place 1 capsule (18 mcg total) into inhaler and inhale daily.  . vitamin C (ASCORBIC ACID) 500 MG tablet Take 500 mg by mouth daily.  . [DISCONTINUED] losartan-hydrochlorothiazide (HYZAAR) 100-25 MG per tablet Take 1 tablet by mouth daily. Reported on 03/02/2015   No facility-administered encounter medications on file as of 03/02/2015.    No Known Allergies   Current Medications, Allergies, Past Medical History, Past Surgical History, Family History, and Social History were  reviewed in [employer] record.   Review of Systems             All symptoms NEG except where BOLDED >>  Constitutional:  F/C/S, fatigue, anorexia, unexpected weight change. HEENT:  HA, visual changes, hearing loss, earache, nasal symptoms, sore throat, mouth sores, hoarseness. Resp:  cough, sputum, hemoptysis; SOB, tightness, wheezing. Cardio:  CP, palpit, DOE, orthopnea, edema. GI:  N/V/D/C, blood in stool; reflux, abd pain, distention, gas. GU:  dysuria, freq, urgency, hematuria, flank pain, voiding difficulty. MS:  joint pain, swelling, tenderness, decr ROM; neck pain, back pain, etc. Neuro:  HA, tremors, seizures, dizziness, syncope, weakness, numbness, gait abn.  Skin:  suspicious lesions or skin rash. Heme:  adenopathy, bruising, bleeding. Psyche:  confusion, agitation, sleep disturbance, hallucinations, anxiety, depression suicidal.   Objective:   Physical Exam       Vital Signs:  Reviewed...  General:  WD, WN, 74 y/o WF in NAD; alert & oriented; pleasant & cooperative... HEENT:  [place]/AT; Conjunctiva- pink, Sclera- nonicteric, EOM-wnl, PERRLA, EACs-clear, TMs-wnl; NOSE-clear; THROAT-clear & wnl. Neck:  Supple w/ fair ROM; no JVD; normal carotid impulses w/o bruits; no thyromegaly or nodules palpated; no lymphadenopathy. Chest:  Decr BS at bases, clear- without wheezes, rales, or rhonchi heard. Heart:  Regular Rhythm; norm S1 & S2 without murmurs, rubs, or gallops detected. Abdomen:  Soft & nontender- no guarding or rebound; normal bowel sounds; no organomegaly or masses palpated. Ext:  Normal ROM; without deformities or arthritic changes; no varicose veins, venous insuffic, or edema;  Pulses intact w/o bruits. Neuro:  No focal neuro deficits; sensory testing normal; gait normal & balance OK. Derm:  No lesions noted; no rash etc. Lymph:  No cervical, supraclavicular, axillary, or inguinal adenopathy palpated.   Assessment:      IMP >>     COPD, former  smoker, w/ mod severe airflow obstruction, c/w GOLD Stage 2-3 COPD w/ smal  Revers component and normal DLCO...  PLAN >>  12/23/14>   Lakin has mod severe COPD (c/w GOLD Stage2-3 disease) and a small reversible component;  She has finally quit smoking & we discussed the need for regular dosing of the Symbicort160- 2sp Bid and SPIRIVA daily;  It is hoped that regular dosing + an exercise program will result in some improvement over time... She should be sure that she is up to date on needed immunizations, & try to avoid infections. 03/02/15>   Francessca is stable- rec to continue the Symbicort & Spiriva daily, stay active w/ exercise program, and as far as poss avoid infections... We plan ROV 59mo, sooner if needed prn...     Plan:     Patient's Medications  New Prescriptions   No medications on file  Previous Medications   ASPIRIN (ASPIR-81) 81 MG EC TABLET       CHOLECALCIFEROL (VITAMIN D) 1000 UNITS TABLET    Take 2,000 Units by mouth daily.   FLECTOR 1.3 % PTCH       FLUTICASONE (FLONASE) 50 MCG/ACT NASAL SPRAY    Place 1 spray into both nostrils daily.   GABAPENTIN (NEURONTIN) 300 MG CAPSULE    TAKE 3 CAPSULES (=900MG ) 3 TIMES A DAY (TOTAL OF      2700MG  DAILY)   LOSARTAN-HYDROCHLOROTHIAZIDE (HYZAAR) 100-12.5 MG TABLET       OMEGA-3 FATTY ACIDS (FISH OIL PO)    Take by mouth.   PRAVASTATIN (PRAVACHOL) 80 MG TABLET    Take 80 mg by mouth daily.   SYMBICORT 160-4.5 MCG/ACT INHALER    Inhale 2 puffs into the lungs 2 (two) times daily.   TIOTROPIUM (SPIRIVA HANDIHALER) 18 MCG INHALATION CAPSULE    Place 1 capsule (18 mcg total) into inhaler and inhale daily.   VITAMIN C (ASCORBIC ACID) 500 MG TABLET    Take 500 mg by mouth daily.  Modified Medications   No medications on file  Discontinued Medications   LOSARTAN-HYDROCHLOROTHIAZIDE (HYZAAR) 100-25 MG PER TABLET    Take 1 tablet by mouth daily. Reported on 03/02/2015

## 2015-03-02 NOTE — Patient Instructions (Signed)
Today we updated your med list in our EPIC system...    Continue your current medications the same...  Stay as active as possible...  Call for any questions...  Let's plan a follow up visit in 6mo, sooner if needed for problems...   

## 2015-03-23 DIAGNOSIS — Z85820 Personal history of malignant melanoma of skin: Secondary | ICD-10-CM | POA: Diagnosis not present

## 2015-03-23 DIAGNOSIS — D1801 Hemangioma of skin and subcutaneous tissue: Secondary | ICD-10-CM | POA: Diagnosis not present

## 2015-03-23 DIAGNOSIS — L821 Other seborrheic keratosis: Secondary | ICD-10-CM | POA: Diagnosis not present

## 2015-03-23 DIAGNOSIS — L812 Freckles: Secondary | ICD-10-CM | POA: Diagnosis not present

## 2015-04-27 ENCOUNTER — Encounter: Payer: Self-pay | Admitting: Neurology

## 2015-06-07 ENCOUNTER — Telehealth: Payer: Self-pay

## 2015-06-07 NOTE — Telephone Encounter (Signed)
Pt called to cancel appt 3/22 appt. Will cb to reschedule later.

## 2015-06-08 ENCOUNTER — Ambulatory Visit: Payer: Medicare Other | Admitting: Hematology & Oncology

## 2015-06-08 ENCOUNTER — Other Ambulatory Visit: Payer: Medicare Other

## 2015-06-22 DIAGNOSIS — J449 Chronic obstructive pulmonary disease, unspecified: Secondary | ICD-10-CM | POA: Diagnosis not present

## 2015-06-22 DIAGNOSIS — Z0000 Encounter for general adult medical examination without abnormal findings: Secondary | ICD-10-CM | POA: Diagnosis not present

## 2015-06-22 DIAGNOSIS — E785 Hyperlipidemia, unspecified: Secondary | ICD-10-CM | POA: Diagnosis not present

## 2015-06-22 DIAGNOSIS — E871 Hypo-osmolality and hyponatremia: Secondary | ICD-10-CM | POA: Diagnosis not present

## 2015-06-22 DIAGNOSIS — I10 Essential (primary) hypertension: Secondary | ICD-10-CM | POA: Diagnosis not present

## 2015-06-22 DIAGNOSIS — R7303 Prediabetes: Secondary | ICD-10-CM | POA: Diagnosis not present

## 2015-06-22 DIAGNOSIS — E559 Vitamin D deficiency, unspecified: Secondary | ICD-10-CM | POA: Diagnosis not present

## 2015-06-22 DIAGNOSIS — R7309 Other abnormal glucose: Secondary | ICD-10-CM | POA: Diagnosis not present

## 2015-08-31 ENCOUNTER — Ambulatory Visit (INDEPENDENT_AMBULATORY_CARE_PROVIDER_SITE_OTHER): Payer: Medicare Other | Admitting: Pulmonary Disease

## 2015-08-31 ENCOUNTER — Encounter: Payer: Self-pay | Admitting: Pulmonary Disease

## 2015-08-31 VITALS — BP 134/72 | HR 81 | Ht 63.0 in | Wt 132.4 lb

## 2015-08-31 DIAGNOSIS — J449 Chronic obstructive pulmonary disease, unspecified: Secondary | ICD-10-CM

## 2015-08-31 DIAGNOSIS — H524 Presbyopia: Secondary | ICD-10-CM | POA: Diagnosis not present

## 2015-08-31 DIAGNOSIS — H43811 Vitreous degeneration, right eye: Secondary | ICD-10-CM | POA: Diagnosis not present

## 2015-08-31 DIAGNOSIS — Z961 Presence of intraocular lens: Secondary | ICD-10-CM | POA: Diagnosis not present

## 2015-08-31 DIAGNOSIS — H26491 Other secondary cataract, right eye: Secondary | ICD-10-CM | POA: Diagnosis not present

## 2015-08-31 MED ORDER — TIOTROPIUM BROMIDE MONOHYDRATE 18 MCG IN CAPS: 18.0000 ug | ORAL_CAPSULE | Freq: Every day | RESPIRATORY_TRACT | Status: DC

## 2015-08-31 MED ORDER — SYMBICORT 160-4.5 MCG/ACT IN AERO: 2.0000 | INHALATION_SPRAY | Freq: Two times a day (BID) | RESPIRATORY_TRACT | Status: DC

## 2015-08-31 NOTE — Patient Instructions (Signed)
Today we updated your med list in our EPIC system...    Continue your current medications the same...  Brittany Oliver,  congrats on your exercise program & the progress you have made...  Continue the Symbicort twice daily & the Spiriva once daily...  Call for any questions...  Let's plan a follow up visit in 34mo, sooner if needed for problems.Marland KitchenMarland Kitchen

## 2015-09-01 ENCOUNTER — Encounter: Payer: Self-pay | Admitting: Pulmonary Disease

## 2015-09-01 NOTE — Progress Notes (Signed)
Subjective:     Patient ID: Brittany Oliver, female   DOB: 1940/08/16, 75 y.o.   MRN: [id number]  HPI 75 y/o WF followed for COPD, former smoker (quit 2006), w/ mod severe airflow obstruction c/w GOLD Stage 2-3 COPD w/ small revers component and normal DLCO...  ~  December 23, 2014:  Initial pulmonary consultation w/ SN>        68 y/o WF, referred by Dr. [name], [facility], for a pulmonary evaluation;  Brittany Oliver is c/o a 1-2 yr hx of SOB/ DOE when doing yard work, walking fast, or taking stairs;  Brittany Oliver is an ex-smoker having started in her teens, smoked for 25-30 yrs up to 1ppd, and quit for the 1st time in 1987 when Brittany Oliver had a stroke; then Brittany Oliver restarted smoking up to 1/2ppd for another 20 yrs and quit for good in 2006 (?35-40 pack-yr total);  Brittany Oliver notes little cough, denies sputum/ hemoptysis/ CP/ etc;  Brittany Oliver denies any hx of signif resp illnesses> no prev bronchitis, pneumonia, asthma, TB or known exposure;  Similarly Brittany Oliver denies exposure to asbestos, or any known toxins etc;  Brittany Oliver was adopted and does not have any knowledge of genetic history;  Brittany Oliver has been using Symbicort160- but not taking it regularly because Brittany Oliver didn't notice much difference...    Current Meds>  On Symbicort160-2spBid (?if using regularly), ASA81, Hyzaar100-25, Pravastatin80, Neurontin300-3Tid...  EXAM shows Afeb, VSS, O2sat=95% on RA;  Heent- neg, mallampati1;  Chest- decr BS bilat but clear w/o w/r/r;  Heart- RR w/o m/r/g;    Old LABS in epic from 3/16>  Chems- wnl;  CBC- wnl   CXR 12/30/14 showed norm heart size, clear lungs w/ biapical pleural thickening, NAD (no change from old film 1/15).  Ambulatory oxygen saturation test 12/30/14> O2sat=96% on RA at rest w/ pulse=69/min; Brittany Oliver ambulated only 2 laps & stopped w/ dyspnea & O2sat=87%    W/ pulse=103/min...  FullPFTs done 01/20/15 showed FVC=2.27 (82%), FEV1=1/13 (54%), %1sec=50, mid-flows are reduced at 38% predicted; there was a 12% improvement in FEV1 after a  bronchodil;  TLC=4.90 (99%), RV=2.69 (121%), RV/TLC=55; DLCO=84% predicted... PFT is c/w a moderately severe ventilatory defect w/ mild air trapping, small revers component, and normal diffusion...     IMP/PLAN>>  Brittany Oliver has mod severe COPD (c/w GOLD Stage2-3 disease) and a small reversible component;  Brittany Oliver has finally quit smoking & we discussed the need for regular dosing of the Symbicort160- 2sp Bid and SPIRIVA daily;  It is hoped that regular dosing + an exercise program will result in some improvement over time... Brittany Oliver should be sure that Brittany Oliver is up to date on needed immunizations, & try to avoid infections... We will plan recheck in ~6wks.  ~  March 02, 2015:  43mo ROV w/ SN>  Brittany Oliver returns having started on her Symbicort & Spiriva and feeling better w/ less dyspnea, no cough/ sputum/ hemoptysis/ etc;  Brittany Oliver notes that Brittany Oliver can walk farther and carry out a conversation at the same time;  Stairs seem easier etc;  Brittany Oliver is pleased w/ her progress...  EXAM shows Afeb, VSS, O2sat=96% on RA;  Heent- neg, mallampati1;  Chest- decr BS bilat but clear w/o w/r/r;  Heart- RR w/o m/r/g;  Abd- soft, nontender, neg;  Ext- neg w/o c/c/e;  Neuro- intact...  We reviewed her prev CXR, PFT, ambulatory oximetry... IMP?PLAN>>  COPD, former smoker, w/ mod severe airflow obstruction, c/w GOLD Stage 2-3 COPD w/ small revers component and normal DLCO;  Brittany Oliver  is rec to continue the Symbicort & Spiriva daily, stay active w/ exercise program, and as far as poss avoid infections... We plan ROV 54mo, sooner if needed prn...  ~  August 31, 2015:  35mo ROV w/ SN>  Brittany Oliver returns for a 67mo ROV feeling well & reports a good interval w/o new complaints or concerns;  Brittany Oliver is using her Symbicort & Spiriva regularly & exercising via walking & in her home gym;  Brittany Oliver notes DOE after 1-1.5 mile walk but does ok as long as Brittany Oliver paces herself;  Brittany Oliver denies much cough, sputum, no hemoptysis, etc;  Brittany Oliver denies CP. Palpit, edema;  Brittany Oliver notes improved  stamina...  EXAM shows Afeb, VSS, O2sat=95% on RA;  Heent- neg, mallampati1;  Chest- decr BS bilat but clear w/o w/r/r;  Heart- RR w/o m/r/g;  Abd- soft, nontender, neg;  Ext- neg w/o c/c/e;  Neuro- intact...  We repeated her ambulatory oximetry 08/31/15>  O2sat=94% on RA at rest w/ pulse=91/min;  Brittany Oliver walked 3 laps w/ lowest O2sat=90% w/ pulse=111/min (this is improved from 12/2014) IMP/PLAN>>  COPD, former smoker, w/ mod severe airflow obstruction, c/w GOLD Stage 2-3 COPD w/ small revers component and normal DLCO>  Brittany Oliver is improved and asked to continue her inhalers regularly, plus continue her exercise program;  Brittany Oliver will call for any problems and we plan ROV recheck in 85mo...    Past Medical History  Diagnosis Date  . Hypertension >> controlled on Hyzaar   . Heart murmur >> Brittany Oliver has an AbnEKG w/ poor R prog V1-3   . Hyperlipidemia >> on Prav80   . Stroke [facility]) 1987    has a little balance issue since stroke  . Neuralgia facialis vera >> on Neurontin from [facility]; Brittany Oliver had Gamma Knife Rx x2   . Cancer ([place]) 02/2013    skin melanoma >> removed from left arm 1/15, Stage iB, followed by DrEnnever  . Arthritis    LBP >> Brittany Oliver has had shots in the past    Osteopenia     Past Surgical History  Procedure Laterality Date  . Finger surgery Right     middle finger  . Tonsillectomy    . Appendectomy    . Abdominal hysterectomy  1974    pt still has her uterus, took only 1 ovary and 1 tube  . Eye surgery Bilateral 2006    cataract and laser surgery  . Colonoscopy    . Dilation and curettage of uterus  1965  . Excision melanoma with sentinel lymph node biopsy Left 03/27/2013    Procedure: EXCISION MELANOMA LEFT POSTERIOR ARM WITH LEFT AXILLARY SENTINEL LYMPH NODE BIOPSY;  Surgeon: Zenovia Jarred, MD;  Location: [place];  Service: General;  Laterality: Left;  Marland Kitchen Mass excision Left 09/15/2013    Procedure: EXCISION MASS LEFT UPPER ARM;  Surgeon: Zenovia Jarred, MD;  Location: [place];  Service:  General;  Laterality: Left;    Outpatient Encounter Prescriptions as of 08/31/2015  Medication Sig  . aspirin (ASPIR-81) 81 MG EC tablet   . Calcium Citrate-Vitamin D (CALCIUM + D PO) Take 1 capsule by mouth daily.  Marland Kitchen FLECTOR 1.3 % PTCH   . gabapentin (NEURONTIN) 300 MG capsule TAKE 3 CAPSULES (=900MG ) 3 TIMES A DAY (TOTAL OF      2700MG  DAILY)  . losartan-hydrochlorothiazide (HYZAAR) 100-12.5 MG tablet   . Omega-3 Fatty Acids (FISH OIL PO) Take by mouth.  . pravastatin (PRAVACHOL) 80 MG tablet Take 80 mg by mouth  daily.  . SYMBICORT 160-4.5 MCG/ACT inhaler Inhale 2 puffs into the lungs 2 (two) times daily.  Marland Kitchen tiotropium (SPIRIVA HANDIHALER) 18 MCG inhalation capsule Place 1 capsule (18 mcg total) into inhaler and inhale daily.  . vitamin C (ASCORBIC ACID) 500 MG tablet Take 500 mg by mouth daily.  . [DISCONTINUED] cholecalciferol (VITAMIN D) 1000 UNITS tablet Take 600 Units by mouth daily.   . [DISCONTINUED] SYMBICORT 160-4.5 MCG/ACT inhaler Inhale 2 puffs into the lungs 2 (two) times daily.  . [DISCONTINUED] tiotropium (SPIRIVA HANDIHALER) 18 MCG inhalation capsule Place 1 capsule (18 mcg total) into inhaler and inhale daily.  . [DISCONTINUED] fluticasone (FLONASE) 50 MCG/ACT nasal spray Place 1 spray into both nostrils daily. Reported on 08/31/2015   No facility-administered encounter medications on file as of 08/31/2015.    No Known Allergies   Current Medications, Allergies, Past Medical History, Past Surgical History, Family History, and Social History were reviewed in [employer] record.   Review of Systems             All symptoms NEG except where BOLDED >>  Constitutional:  F/C/S, fatigue, anorexia, unexpected weight change. HEENT:  HA, visual changes, hearing loss, earache, nasal symptoms, sore throat, mouth sores, hoarseness. Resp:  cough, sputum, hemoptysis; SOB, tightness, wheezing. Cardio:  CP, palpit, DOE, orthopnea, edema. GI:  N/V/D/C, blood in  stool; reflux, abd pain, distention, gas. GU:  dysuria, freq, urgency, hematuria, flank pain, voiding difficulty. MS:  joint pain, swelling, tenderness, decr ROM; neck pain, back pain, etc. Neuro:  HA, tremors, seizures, dizziness, syncope, weakness, numbness, gait abn. Skin:  suspicious lesions or skin rash. Heme:  adenopathy, bruising, bleeding. Psyche:  confusion, agitation, sleep disturbance, hallucinations, anxiety, depression suicidal.   Objective:   Physical Exam       Vital Signs:  Reviewed...  General:  WD, WN, 75 y/o WF in NAD; alert & oriented; pleasant & cooperative... HEENT:  [place]/AT; Conjunctiva- pink, Sclera- nonicteric, EOM-wnl, PERRLA, EACs-clear, TMs-wnl; NOSE-clear; THROAT-clear & wnl. Neck:  Supple w/ fair ROM; no JVD; normal carotid impulses w/o bruits; no thyromegaly or nodules palpated; no lymphadenopathy. Chest:  Decr BS at bases, clear- without wheezes, rales, or rhonchi heard. Heart:  Regular Rhythm; norm S1 & S2 without murmurs, rubs, or gallops detected. Abdomen:  Soft & nontender- no guarding or rebound; normal bowel sounds; no organomegaly or masses palpated. Ext:  Normal ROM; without deformities or arthritic changes; no varicose veins, venous insuffic, or edema;  Pulses intact w/o bruits. Neuro:  No focal neuro deficits; sensory testing normal; gait normal & balance OK. Derm:  No lesions noted; no rash etc. Lymph:  No cervical, supraclavicular, axillary, or inguinal adenopathy palpated.   Assessment:      IMP >>     COPD, former smoker, w/ mod severe airflow obstruction, c/w GOLD Stage 2-3 COPD w/ smal  Revers component and normal DLCO...  PLAN >>  12/23/14>   Brittany Oliver has mod severe COPD (c/w GOLD Stage2-3 disease) and a small reversible component;  Brittany Oliver has finally quit smoking & we discussed the need for regular dosing of the Symbicort160- 2sp Bid and SPIRIVA daily;  It is hoped that regular dosing + an exercise program will result in some improvement  over time... Brittany Oliver should be sure that Brittany Oliver is up to date on needed immunizations, & try to avoid infections. 03/02/15>   Brittany Oliver is stable- rec to continue the Symbicort & Spiriva daily, stay active w/ exercise program, and as far as  poss avoid infections... We plan ROV 54mo, sooner if needed prn... 08/31/15>   Brittany Oliver remains stable on her Symbicort/ Spiriva/ exercise; reminded to use the inhalers regularly & stay active...     Plan:     Patient's Medications  New Prescriptions   No medications on file  Previous Medications   ASPIRIN (ASPIR-81) 81 MG EC TABLET       CALCIUM CITRATE-VITAMIN D (CALCIUM + D PO)    Take 1 capsule by mouth daily.   FLECTOR 1.3 % PTCH       GABAPENTIN (NEURONTIN) 300 MG CAPSULE    TAKE 3 CAPSULES (=900MG ) 3 TIMES A DAY (TOTAL OF      2700MG  DAILY)   LOSARTAN-HYDROCHLOROTHIAZIDE (HYZAAR) 100-12.5 MG TABLET       OMEGA-3 FATTY ACIDS (FISH OIL PO)    Take by mouth.   PRAVASTATIN (PRAVACHOL) 80 MG TABLET    Take 80 mg by mouth daily.   VITAMIN C (ASCORBIC ACID) 500 MG TABLET    Take 500 mg by mouth daily.  Modified Medications   Modified Medication Previous Medication   SYMBICORT 160-4.5 MCG/ACT INHALER SYMBICORT 160-4.5 MCG/ACT inhaler      Inhale 2 puffs into the lungs 2 (two) times daily.    Inhale 2 puffs into the lungs 2 (two) times daily.   TIOTROPIUM (SPIRIVA HANDIHALER) 18 MCG INHALATION CAPSULE tiotropium (SPIRIVA HANDIHALER) 18 MCG inhalation capsule      Place 1 capsule (18 mcg total) into inhaler and inhale daily.    Place 1 capsule (18 mcg total) into inhaler and inhale daily.  Discontinued Medications   CHOLECALCIFEROL (VITAMIN D) 1000 UNITS TABLET    Take 600 Units by mouth daily.    FLUTICASONE (FLONASE) 50 MCG/ACT NASAL SPRAY    Place 1 spray into both nostrils daily. Reported on 08/31/2015

## 2015-10-05 DIAGNOSIS — Z1231 Encounter for screening mammogram for malignant neoplasm of breast: Secondary | ICD-10-CM | POA: Diagnosis not present

## 2015-10-28 ENCOUNTER — Encounter: Payer: Self-pay | Admitting: Neurology

## 2015-10-28 ENCOUNTER — Ambulatory Visit (INDEPENDENT_AMBULATORY_CARE_PROVIDER_SITE_OTHER): Payer: Medicare Other | Admitting: Neurology

## 2015-10-28 VITALS — BP 122/62 | HR 88 | Ht 63.0 in | Wt 130.0 lb

## 2015-10-28 DIAGNOSIS — G501 Atypical facial pain: Secondary | ICD-10-CM | POA: Diagnosis not present

## 2015-10-28 MED ORDER — GABAPENTIN 300 MG PO CAPS
ORAL_CAPSULE | ORAL | 3 refills | Status: DC
Start: 2015-10-28 — End: 2017-01-17

## 2015-10-28 NOTE — Progress Notes (Signed)
Chart forwarded.  

## 2015-10-28 NOTE — Progress Notes (Signed)
NEUROLOGY FOLLOW UP OFFICE NOTE  JASANI AKRIDGE [id number]  HISTORY OF PRESENT ILLNESS: Brittany Oliver is a 75 year old right-handed woman with history of posterior circulation stroke, hypertension, and hypercholesterolemia who follows up for atypical left-sided facial pain.     UPDATE: Current medication:  gabapentin 900mg  three times daily.   Pain controlled.  She notes some mild left peri-oral discomfort and numbness.   HISTORY: She began experiencing left-sided facial pain in 2006.  She reports a fairly constant burning and pins and needles sensation in the left V1 and V2 distribution.  Light touch, cold and wind can trigger a shooting pain down the face, but there is no spontaneous paroxysmal shooting pain. Eating and brushing her teeth does not aggravate it.  She says heat seems to help relieve it.  She was eventually diagnosed with left trigeminal neuralgia in 2012.   Past medications:  Trileptal (caused hyponatremia) Other therapy:  Gamma Knife x2 (ineffective).   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   She does have a history of brainstem stroke in 1987, in which she presented with left oculomotor weakness, left rotatory nystagmus, left facial numbness and dysfunction of her right arm and leg.  She still has residual numbness of the left face.  PAST MEDICAL HISTORY: Past Medical History:  Diagnosis Date  . Arthritis   . Cancer ([place]) 02/2013   skin melanoma  . Heart murmur   . Hyperlipidemia   . Hypertension   . Neuralgia facialis vera   . Stroke [facility]) 1987   has a little balance issue since stroke    MEDICATIONS: Current Outpatient Prescriptions on File Prior to Visit  Medication Sig Dispense Refill  . aspirin (ASPIR-81) 81 MG EC tablet     . Calcium Citrate-Vitamin D (CALCIUM + D PO)  Take 1 capsule by mouth daily.    Marland Kitchen FLECTOR 1.3 % PTCH     . losartan-hydrochlorothiazide (HYZAAR) 100-12.5 MG tablet     . pravastatin (PRAVACHOL) 80 MG tablet Take 80 mg by mouth daily.    . SYMBICORT 160-4.5 MCG/ACT inhaler Inhale 2 puffs into the lungs 2 (two) times daily. 3 Inhaler 3  . tiotropium (SPIRIVA HANDIHALER) 18 MCG inhalation capsule Place 1 capsule (18 mcg total) into inhaler and inhale daily. 90 capsule 3  . vitamin C (ASCORBIC ACID) 500 MG tablet Take 500 mg by mouth daily.     No current facility-administered medications on file prior to visit.     ALLERGIES: No Known Allergies  FAMILY HISTORY: Family History  Problem Relation Age of Onset  . Adopted: Yes  . Ataxia Neg Hx   . Chorea Neg Hx   . Dementia Neg Hx   . Mental retardation Neg Hx   . Migraines Neg Hx   . Multiple sclerosis Neg Hx   . Neurofibromatosis Neg Hx   . Neuropathy Neg Hx   . Parkinsonism Neg Hx   . Seizures Neg Hx   . Stroke Neg Hx     SOCIAL HISTORY: Social History   Social History  . Marital status: Married    Spouse name: N/A  . Number of children: N/A  . Years of education: N/A   Occupational History  . Not on file.   Social History Main Topics  . Smoking status: Former Smoker    Packs/day: 0.50  Years: 43.20    Types: Cigarettes    Quit date: 03/23/2005  . Smokeless tobacco: Never Used     Comment: QUIT 9 YEARS AGO  . Alcohol use 1.2 oz/week    2 Glasses of wine per week  . Drug use: No  . Sexual activity: Yes    Partners: Male   Other Topics Concern  . Not on file   Social History Narrative  . No narrative on file    REVIEW OF SYSTEMS: Constitutional: No fevers, chills, or sweats, no generalized fatigue, change in appetite Eyes: No visual changes, double vision, eye pain Ear, nose and throat: No hearing loss, ear pain, nasal congestion, sore throat Cardiovascular: No chest pain, palpitations Respiratory:  No shortness of breath at rest or with exertion,  wheezes GastrointestinaI: No nausea, vomiting, diarrhea, abdominal pain, fecal incontinence Genitourinary:  No dysuria, urinary retention or frequency Musculoskeletal:  No neck pain, back pain Integumentary: No rash, pruritus, skin lesions Neurological: as above Psychiatric: No depression, insomnia, anxiety Endocrine: No palpitations, fatigue, diaphoresis, mood swings, change in appetite, change in weight, increased thirst Hematologic/Lymphatic:  No purpura, petechiae. Allergic/Immunologic: no itchy/runny eyes, nasal congestion, recent allergic reactions, rashes  PHYSICAL EXAM: Vitals:   10/28/15 1358  BP: 122/62  Pulse: 88   General: No acute distress.  Patient appears well-groomed.  normal body habitus. Head:  Normocephalic/atraumatic Eyes:  Fundi examined but not visualized Neck: supple, no paraspinal tenderness, full range of motion Heart:  Regular rate and rhythm Lungs:  Clear to auscultation bilaterally Back: No paraspinal tenderness Neurological Exam: alert and oriented to person, place, and time. Attention span and concentration intact, recent and remote memory intact, fund of knowledge intact.  Speech fluent and not dysarthric, language intact.  Mild left sided facial numbness.  Otherwise CN II-XII intact. Bulk and tone normal, muscle strength 5/5 throughout.  Sensation to light touch, temperature and vibration intact.  Deep tendon reflexes 2+ throughout, toes downgoing.  Finger to nose testing intact.  Gait normal but stumbled a bit when she turned around, Romberg with sway.  IMPRESSION: Atypical left-sided facial pain, stable  PLAN: Continue gabapentin 900mg  three times daily Follow up in one year  15 minutes spent face to face with patient, over 50% spent counseling.  Metta Clines, DO  CC:  [name], MD

## 2015-12-27 DIAGNOSIS — M5136 Other intervertebral disc degeneration, lumbar region: Secondary | ICD-10-CM | POA: Diagnosis not present

## 2015-12-27 DIAGNOSIS — M545 Low back pain: Secondary | ICD-10-CM | POA: Diagnosis not present

## 2016-01-04 DIAGNOSIS — I10 Essential (primary) hypertension: Secondary | ICD-10-CM | POA: Diagnosis not present

## 2016-01-04 DIAGNOSIS — E785 Hyperlipidemia, unspecified: Secondary | ICD-10-CM | POA: Diagnosis not present

## 2016-01-04 DIAGNOSIS — R7301 Impaired fasting glucose: Secondary | ICD-10-CM | POA: Diagnosis not present

## 2016-01-04 DIAGNOSIS — Z23 Encounter for immunization: Secondary | ICD-10-CM | POA: Diagnosis not present

## 2016-01-04 DIAGNOSIS — J449 Chronic obstructive pulmonary disease, unspecified: Secondary | ICD-10-CM | POA: Diagnosis not present

## 2016-03-05 ENCOUNTER — Ambulatory Visit: Payer: Medicare Other | Admitting: Pulmonary Disease

## 2016-04-09 ENCOUNTER — Telehealth: Payer: Self-pay

## 2016-04-09 NOTE — Telephone Encounter (Signed)
Spoke to patient to confirm has Gabapentin refill. Received fax request from mail service. Patient confirmed med received not sure why she got another email or why we got another fax.

## 2016-04-12 DIAGNOSIS — Z85820 Personal history of malignant melanoma of skin: Secondary | ICD-10-CM | POA: Diagnosis not present

## 2016-04-12 DIAGNOSIS — L570 Actinic keratosis: Secondary | ICD-10-CM | POA: Diagnosis not present

## 2016-04-12 DIAGNOSIS — L814 Other melanin hyperpigmentation: Secondary | ICD-10-CM | POA: Diagnosis not present

## 2016-04-12 DIAGNOSIS — D225 Melanocytic nevi of trunk: Secondary | ICD-10-CM | POA: Diagnosis not present

## 2016-04-12 DIAGNOSIS — L821 Other seborrheic keratosis: Secondary | ICD-10-CM | POA: Diagnosis not present

## 2016-04-12 DIAGNOSIS — D485 Neoplasm of uncertain behavior of skin: Secondary | ICD-10-CM | POA: Diagnosis not present

## 2016-04-12 DIAGNOSIS — D1801 Hemangioma of skin and subcutaneous tissue: Secondary | ICD-10-CM | POA: Diagnosis not present

## 2016-08-03 DIAGNOSIS — R7303 Prediabetes: Secondary | ICD-10-CM | POA: Diagnosis not present

## 2016-08-03 DIAGNOSIS — E559 Vitamin D deficiency, unspecified: Secondary | ICD-10-CM | POA: Diagnosis not present

## 2016-08-03 DIAGNOSIS — I10 Essential (primary) hypertension: Secondary | ICD-10-CM | POA: Diagnosis not present

## 2016-08-03 DIAGNOSIS — Z0000 Encounter for general adult medical examination without abnormal findings: Secondary | ICD-10-CM | POA: Diagnosis not present

## 2016-08-03 DIAGNOSIS — E785 Hyperlipidemia, unspecified: Secondary | ICD-10-CM | POA: Diagnosis not present

## 2016-08-22 DIAGNOSIS — D72819 Decreased white blood cell count, unspecified: Secondary | ICD-10-CM | POA: Diagnosis not present

## 2016-09-05 DIAGNOSIS — H02834 Dermatochalasis of left upper eyelid: Secondary | ICD-10-CM | POA: Diagnosis not present

## 2016-09-05 DIAGNOSIS — H43811 Vitreous degeneration, right eye: Secondary | ICD-10-CM | POA: Diagnosis not present

## 2016-09-05 DIAGNOSIS — H04123 Dry eye syndrome of bilateral lacrimal glands: Secondary | ICD-10-CM | POA: Diagnosis not present

## 2016-09-05 DIAGNOSIS — H52203 Unspecified astigmatism, bilateral: Secondary | ICD-10-CM | POA: Diagnosis not present

## 2016-10-09 DIAGNOSIS — M85832 Other specified disorders of bone density and structure, left forearm: Secondary | ICD-10-CM | POA: Diagnosis not present

## 2016-10-09 DIAGNOSIS — Z1231 Encounter for screening mammogram for malignant neoplasm of breast: Secondary | ICD-10-CM | POA: Diagnosis not present

## 2016-10-31 ENCOUNTER — Ambulatory Visit: Payer: Medicare Other | Admitting: Neurology

## 2016-11-28 DIAGNOSIS — M545 Low back pain: Secondary | ICD-10-CM | POA: Diagnosis not present

## 2016-11-28 DIAGNOSIS — M5136 Other intervertebral disc degeneration, lumbar region: Secondary | ICD-10-CM | POA: Diagnosis not present

## 2016-11-28 DIAGNOSIS — G8929 Other chronic pain: Secondary | ICD-10-CM | POA: Diagnosis not present

## 2017-01-17 ENCOUNTER — Ambulatory Visit (INDEPENDENT_AMBULATORY_CARE_PROVIDER_SITE_OTHER): Payer: Medicare Other | Admitting: Neurology

## 2017-01-17 ENCOUNTER — Encounter: Payer: Self-pay | Admitting: Neurology

## 2017-01-17 ENCOUNTER — Other Ambulatory Visit: Payer: Medicare Other

## 2017-01-17 VITALS — BP 138/78 | HR 80 | Ht 63.0 in | Wt 127.0 lb

## 2017-01-17 DIAGNOSIS — G501 Atypical facial pain: Secondary | ICD-10-CM

## 2017-01-17 DIAGNOSIS — R2681 Unsteadiness on feet: Secondary | ICD-10-CM

## 2017-01-17 DIAGNOSIS — G609 Hereditary and idiopathic neuropathy, unspecified: Secondary | ICD-10-CM

## 2017-01-17 MED ORDER — GABAPENTIN 300 MG PO CAPS: ORAL_CAPSULE | ORAL | 3 refills | Status: DC

## 2017-01-17 NOTE — Patient Instructions (Signed)
1.  Continue gabapentin 900mg  three times daily 2.  We will refer you to physical therapy for balance 3.  We will check labs looking for causes of neuropathy:  ANA, Sed Rate, B12, B6, TSH, SPEP/IFE 4.  Follow up in one year or as needed.

## 2017-01-17 NOTE — Progress Notes (Signed)
NEUROLOGY FOLLOW UP OFFICE NOTE  [name] [id number]  HISTORY OF PRESENT ILLNESS: Brittany Oliver is a 76 year old right-handed woman with history of posterior circulation stroke, hypertension, and hypercholesterolemia who follows up for atypical left-sided facial pain as well as balance issues.Marland Kitchen     UPDATE: Current medication:  gabapentin 900mg  three times daily. Facial pain is controlled.  Over the past several months, she reports trouble with balance.  There is no associated dizziness, double vision, numbness in feet or leg weakness.  She does have chronic back pain but nothing new.  She states that she just feels off-balance and stumbles.  She has not fallen.   HISTORY: She began experiencing left-sided facial pain in 2006.  She reports a fairly constant burning and pins and needles sensation in the left V1 and V2 distribution.  Light touch, cold and wind can trigger a shooting pain down the face, but there is no spontaneous paroxysmal shooting pain. Eating and brushing her teeth does not aggravate it.  She says heat seems to help relieve it.  She was eventually diagnosed with left trigeminal neuralgia in 2012.   Past medications:  Trileptal (caused hyponatremia) Other therapy:  Gamma Knife x2 (ineffective).   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   She does have a history of brainstem stroke in 1987, in which she presented with left oculomotor weakness, left rotatory nystagmus, left facial numbness and dysfunction of her right arm and leg.  She still has residual numbness of the left face.  PAST MEDICAL HISTORY: Past Medical History:  Diagnosis Date  . Arthritis   . Cancer ([place]) 02/2013   skin melanoma  . Heart murmur   . Hyperlipidemia   . Hypertension   . Neuralgia facialis vera   . Stroke  [facility]) 1987   has a little balance issue since stroke    MEDICATIONS: Current Outpatient Prescriptions on File Prior to Visit  Medication Sig Dispense Refill  . Calcium Citrate-Vitamin D (CALCIUM + D PO) Take 1 capsule by mouth daily.    Marland Kitchen FLECTOR 1.3 % PTCH     . losartan-hydrochlorothiazide (HYZAAR) 100-12.5 MG tablet     . pravastatin (PRAVACHOL) 80 MG tablet Take 80 mg by mouth daily.    . SYMBICORT 160-4.5 MCG/ACT inhaler Inhale 2 puffs into the lungs 2 (two) times daily. 3 Inhaler 3  . tiotropium (SPIRIVA HANDIHALER) 18 MCG inhalation capsule Place 1 capsule (18 mcg total) into inhaler and inhale daily. 90 capsule 3  . vitamin C (ASCORBIC ACID) 500 MG tablet Take 500 mg by mouth daily.     No current facility-administered medications on file prior to visit.     ALLERGIES: No Known Allergies  FAMILY HISTORY: Family History  Problem Relation Age of Onset  . Adopted: Yes  . Ataxia Neg Hx   . Chorea Neg Hx   . Dementia Neg Hx   . Mental retardation Neg Hx   . Migraines Neg Hx   . Multiple sclerosis Neg Hx   . Neurofibromatosis Neg Hx   . Neuropathy Neg Hx   . Parkinsonism Neg Hx   . Seizures Neg Hx   . Stroke Neg Hx     SOCIAL HISTORY: Social History   Social History  . Marital status: Married    Spouse name: N/A  . Number of children:  N/A  . Years of education: N/A   Occupational History  . Not on file.   Social History Main Topics  . Smoking status: Former Smoker    Packs/day: 0.50    Years: 43.20    Types: Cigarettes    Quit date: 03/23/2005  . Smokeless tobacco: Never Used     Comment: QUIT 9 YEARS AGO  . Alcohol use 1.2 oz/week    2 Glasses of wine per week  . Drug use: No  . Sexual activity: Yes    Partners: Male   Other Topics Concern  . Not on file   Social History Narrative  . No narrative on file    REVIEW OF SYSTEMS: Constitutional: No fevers, chills, or sweats, no generalized fatigue, change in appetite Eyes: No visual changes,  double vision, eye pain Ear, nose and throat: No hearing loss, ear pain, nasal congestion, sore throat Cardiovascular: No chest pain, palpitations Respiratory:  No shortness of breath at rest or with exertion, wheezes GastrointestinaI: No nausea, vomiting, diarrhea, abdominal pain, fecal incontinence Genitourinary:  No dysuria, urinary retention or frequency Musculoskeletal:  No neck pain, back pain Integumentary: No rash, pruritus, skin lesions Neurological: as above Psychiatric: No depression, insomnia, anxiety Endocrine: No palpitations, fatigue, diaphoresis, mood swings, change in appetite, change in weight, increased thirst Hematologic/Lymphatic:  No purpura, petechiae. Allergic/Immunologic: no itchy/runny eyes, nasal congestion, recent allergic reactions, rashes  PHYSICAL EXAM: Vitals:   01/17/17 1056  BP: 138/78  Pulse: 80  SpO2: 96%   General: No acute distress.  Patient appears well-groomed.  normal body habitus. Head:  Normocephalic/atraumatic Neurological Exam: alert and oriented to person, place, and time. Attention span and concentration intact, recent and remote memory intact, fund of knowledge intact.  Speech fluent and not dysarthric, language intact.  CN II-XII intact. Bulk and tone normal, muscle strength 5/5 throughout.  Sensation to pinprick intact.  Reduced vibration sensation in feet.  Deep tendon reflexes 1+ throughout.  Finger to nose testing intact.  Mildly wide-based gait, sometimes veers to either side. Able to turn.  Unable to tandem walk. Romberg with sway  IMPRESSION: 1.  Atypical left-sided facial pain vs trigeminal neuralgia, stable 2.  Unsteady gait.  She does not demonstrate lateralizing findings on exam or signs of myelopathy to suggest either stroke or cervical spinal stenosis.  She does demonstrate findings of peripheral neuropathy in the feet.  PLAN: 1.  Continue gabapentin 900mg  three times daily 2.  We will refer you to physical therapy for  balance 3.  We will check labs looking for causes of neuropathy:  ANA, Sed Rate, B12, B6, TSH, SPEP/IFE 4.  Follow up in one year or as needed.  25 minutes spent face to face with patient, over 50% spent discussing management.   Metta Clines, DO  CC:  [name], MD

## 2017-01-24 ENCOUNTER — Telehealth: Payer: Self-pay

## 2017-01-24 ENCOUNTER — Encounter: Payer: Self-pay | Admitting: Neurology

## 2017-01-24 NOTE — Telephone Encounter (Signed)
Called and spoke with Pt about lab result, advsd her to start oral B12 1080mcg daily. Advsd Pt we will contact her when we receive the remaining tests. Pt verbalized understanding.

## 2017-01-24 NOTE — Telephone Encounter (Signed)
-----   Message from Pieter Partridge, DO sent at 01/22/2017  7:23 AM EST ----- Thyroid and sed rate (a test for inflammation) are both okay.  B12 is normal but in the low-normal range.  This may possibly cause numbness/nerve issues, so she can start 1000 mcg daily.  We are still waiting on the other tests.

## 2017-01-31 ENCOUNTER — Telehealth: Payer: Self-pay

## 2017-01-31 NOTE — Telephone Encounter (Signed)
Called and spoke with Pt, advsd her of ANA results

## 2017-01-31 NOTE — Telephone Encounter (Signed)
-----   Message from Pieter Partridge, DO sent at 01/29/2017  1:49 PM EST ----- Other tests look okay

## 2017-02-04 DIAGNOSIS — R7303 Prediabetes: Secondary | ICD-10-CM | POA: Diagnosis not present

## 2017-02-04 DIAGNOSIS — Z23 Encounter for immunization: Secondary | ICD-10-CM | POA: Diagnosis not present

## 2017-02-04 DIAGNOSIS — E785 Hyperlipidemia, unspecified: Secondary | ICD-10-CM | POA: Diagnosis not present

## 2017-02-04 DIAGNOSIS — I10 Essential (primary) hypertension: Secondary | ICD-10-CM | POA: Diagnosis not present

## 2017-02-04 DIAGNOSIS — J449 Chronic obstructive pulmonary disease, unspecified: Secondary | ICD-10-CM | POA: Diagnosis not present

## 2017-02-15 ENCOUNTER — Ambulatory Visit: Payer: Medicare Other | Attending: Neurology

## 2017-02-15 DIAGNOSIS — M6281 Muscle weakness (generalized): Secondary | ICD-10-CM | POA: Insufficient documentation

## 2017-02-15 DIAGNOSIS — R208 Other disturbances of skin sensation: Secondary | ICD-10-CM | POA: Diagnosis not present

## 2017-02-15 DIAGNOSIS — R2681 Unsteadiness on feet: Secondary | ICD-10-CM | POA: Diagnosis not present

## 2017-02-15 DIAGNOSIS — R2689 Other abnormalities of gait and mobility: Secondary | ICD-10-CM | POA: Insufficient documentation

## 2017-02-15 NOTE — Therapy (Signed)
[facility] [address] [place], [place], [address] Phone: [phone]   Fax:  [phone]  Physical Therapy Evaluation  Patient Details  Name: Brittany Oliver MRN: [id number] Date of Birth: [birth year] Referring Provider: Dr. Tomi Likens   Encounter Date: 02/15/2017  PT End of Session - 02/15/17 1435    Visit Number  1    Number of Visits  17    Date for PT Re-Evaluation  04/16/17    Authorization Type  Medicare and BCBS: G-CODE AND PN EVERY 10TH VISIT.    PT Start Time  1233    PT Stop Time  1313    PT Time Calculation (min)  40 min    Equipment Utilized During Treatment  -- min guard to S prn    Activity Tolerance  Patient tolerated treatment well    Behavior During Therapy  WFL for tasks assessed/performed       Past Medical History:  Diagnosis Date  . Arthritis   . Cancer ([place]) 02/2013   skin melanoma  . Heart murmur   . Hyperlipidemia   . Hypertension   . Neuralgia facialis vera   . Stroke [facility]) 1987   has a little balance issue since stroke    Past Surgical History:  Procedure Laterality Date  . ABDOMINAL HYSTERECTOMY  1974   pt still has her uterus, took only 1 ovary and 1 tube  . APPENDECTOMY    . COLONOSCOPY    . DILATION AND CURETTAGE OF UTERUS  1965  . EXCISION MELANOMA WITH SENTINEL LYMPH NODE BIOPSY Left 03/27/2013   Procedure: EXCISION MELANOMA LEFT POSTERIOR ARM WITH LEFT AXILLARY SENTINEL LYMPH NODE BIOPSY;  Surgeon: Zenovia Jarred, MD;  Location: [facility];  Service: General;  Laterality: Left;  . EYE SURGERY Bilateral 2006   cataract and laser surgery  . FINGER SURGERY Right    middle finger  . MASS EXCISION Left 09/15/2013   Procedure: EXCISION MASS LEFT UPPER ARM;  Surgeon: Zenovia Jarred, MD;  Location: [facility];  Service: General;  Laterality: Left;  . TONSILLECTOMY      There were no vitals filed for this visit.   Subjective Assessment - 02/15/17 1241    Subjective  Pt reported  she's noticed an incr. in unsteadiness over the last 6 months and was diagnosed with peripheral neuropathy in B feet. Pt's has hx of brainstem CVA in 1987 and stated balance issues began at that time but not too bad. Pt has issues walking over uneven terrain and stairs.     Pertinent History  COPD, hx of CVA in 1987, arthritis, HTN, HLD, heart murmur, peripheral neuropathy in B feet, atypical face pain,     Patient Stated Goals  To just feel more secure when I'm walking and to get stronger.     Currently in Pain?  No/denies         [facility] PT Assessment - 02/15/17 1245      Assessment   Medical Diagnosis  Idiopathic peripheral neuropathy, unsteady gait    Referring Provider  Dr. Tomi Likens    Onset Date/Surgical Date  08/15/16    Hand Dominance  Right    Prior Therapy  none      Precautions   Precautions  Fall    Precaution Comments  based on FGA score.      Restrictions   Weight Bearing Restrictions  No      Balance Screen   Has the patient fallen in  the past 6 months  No    Has the patient had a decrease in activity level because of a fear of falling?   No    Is the patient reluctant to leave their home because of a fear of falling?   No      Home Environment   Living Environment  Private residence    Living Arrangements  Spouse/significant other    Available Help at Discharge  Family    Type of Home  Other(Comment) [place] to enter    Entrance Stairs-Number of Steps  4    Entrance Stairs-Rails  Left with wall on R side    Home Layout  Two level;Able to live on main level with bedroom/bathroom    Alternate Level Stairs-Number of Steps  15    Alternate Level Stairs-Rails  Can reach both on half and then railing on L and wall on R    Home Equipment  Shower seat      Prior Function   Level of Independence  Independent    Vocation  Retired    Medical laboratory scientific officer, read, active in church, get together with friends and have lunch, travel       Cognition   Overall  Cognitive Status  Within Functional Limits for tasks assessed      Sensation   Light Touch  Impaired by gross assessment    Additional Comments  Pt reported decr. sensation in B feet and N/T. Pt also has N/T in face (MD is following facial pain and N/T).      Coordination   Gross Motor Movements are Fluid and Coordinated  Yes    Fine Motor Movements are Fluid and Coordinated  No pt required incr. time to perform L finger to thumb test    Heel Shin Test  decr. speed on L side.      Posture/Postural Control   Posture/Postural Control  Postural limitations    Postural Limitations  Forward head      ROM / Strength   AROM / PROM / Strength  AROM;Strength      AROM   Overall AROM   Within functional limits for tasks performed    Overall AROM Comments  BUE AROM WNL      Strength   Overall Strength  Deficits    Overall Strength Comments  BUE WFL. B hip flex: 4-/5, knee ext: 4+/5, knee flex: 4/5, ankle DF: 4/5, B hip abd/add in sitting: 3+/5. B hip ext not tested but weakness suspected 2/2 gait deviations.       Transfers   Transfers  Sit to Stand;Stand to Sit    Sit to Stand  5: Supervision;From chair/3-in-1;Without upper extremity assist    Stand to Sit  5: Supervision;Without upper extremity assist;To chair/3-in-1      Ambulation/Gait   Ambulation/Gait  Yes    Ambulation/Gait Assistance  5: Supervision;4: Min guard    Ambulation/Gait Assistance Details  Pt experienced incr. postural sway and LOB but corrected with stepping strategy.    Ambulation Distance (Feet)  300 Feet    Assistive device  None    Gait Pattern  Step-through pattern;Decreased stride length;Narrow base of support    Ambulation Surface  Level;Indoor    Gait velocity  3.51ft/sec.      Functional Gait  Assessment   Gait assessed   Yes    Gait Level Surface  Walks 20 ft in less than 5.5 sec, no assistive  devices, good speed, no evidence for imbalance, normal gait pattern, deviates no more than 6 in outside of the 12  in walkway width.    Change in Gait Speed  Able to change speed, demonstrates mild gait deviations, deviates 6-10 in outside of the 12 in walkway width, or no gait deviations, unable to achieve a major change in velocity, or uses a change in velocity, or uses an assistive device.    Gait with Horizontal Head Turns  Performs head turns with moderate changes in gait velocity, slows down, deviates 10-15 in outside 12 in walkway width but recovers, can continue to walk.    Gait with Vertical Head Turns  Performs task with slight change in gait velocity (eg, minor disruption to smooth gait path), deviates 6 - 10 in outside 12 in walkway width or uses assistive device    Gait and Pivot Turn  Turns slowly, requires verbal cueing, or requires several small steps to catch balance following turn and stop    Step Over Obstacle  Is able to step over one shoe box (4.5 in total height) without changing gait speed. No evidence of imbalance.    Gait with Narrow Base of Support  Ambulates less than 4 steps heel to toe or cannot perform without assistance.    Gait with Eyes Closed  Walks 20 ft, uses assistive device, slower speed, mild gait deviations, deviates 6-10 in outside 12 in walkway width. Ambulates 20 ft in less than 9 sec but greater than 7 sec.    Ambulating Backwards  Walks 20 ft, uses assistive device, slower speed, mild gait deviations, deviates 6-10 in outside 12 in walkway width.    Steps  Alternating feet, must use rail.    Total Score  17    FGA comment:  17/30: indicates pt is at high risk for falls.              Objective measurements completed on examination: See above findings.              PT Education - 02/15/17 1434    Education provided  Yes    Education Details  PT educated pt on outcome measure results, PT POC, frequency, and duration.    Person(s) Educated  Patient    Methods  Explanation    Comprehension  Verbalized understanding       PT Short Term Goals -  02/15/17 1443      PT SHORT TERM GOAL #1   Title  Pt will be IND in HEP in order to improve strength, balance, endurance, and flexibility. TARGET DATE FOR ALL STGS: 03/15/17    Status  New      PT SHORT TERM GOAL #2   Title  Pt will improve FGA score to >/=21/30 to decr. falls risk.     Status  New      PT SHORT TERM GOAL #3   Title  Pt will amb. 300', IND, over even terrain without LOB to improve functional mobility.     Status  New      PT SHORT TERM GOAL #4   Title  Perform 6MWT and write goal as indicated.     Status  New        PT Long Term Goals - 02/15/17 1444      PT LONG TERM GOAL #1   Title  Pt will amb. 1000' over even/uneven terrain, at MOD I level, without LOB to improve functional mobility. TARGET DATE FOR ALL  LTGS: 04/12/17    Status  New      PT LONG TERM GOAL #2   Title  Pt will improve FGA score to >/=25/30 to decr. falls risk.     Status  New      PT LONG TERM GOAL #3   Title  Write 6MWT as indicated.     Status  New             Plan - 02/15/17 1436    Clinical Impression Statement  Pt is a pleasant 76y/o female presenting to OPPT neuro with peripheral neuropathy and unsteady gait. Pt's PMH is significant for the following: COPD, hx of CVA in 1987, arthritis, HTN, HLD, heart murmur, peripheral neuropathy in B feet, atypical face pain. The following were noted upon exam: gait devaitions, impaired balance, decr. strength, decr. coorindation, decr. endurance, and impaired sensation. Pt's gait speed was WNL, however, pt experience incr. postural sway during amb. Pt's FGA score indicates pt is at a high risk for falls. PT will formally assess 6MWT test session in order to establish baseline for pt's endurance. Pt would benefit from skilled PT to improve safety during functional mobility.     History and Personal Factors relevant to plan of care:  Pt enjoys traveling with spouse, and volunteering at church    Clinical Presentation  Stable    Clinical  Presentation due to:  COPD, hx of CVA in 1987, arthritis, HTN, HLD, heart murmur, peripheral neuropathy in B feet, atypical face pain,     Clinical Decision Making  Moderate    Rehab Potential  Good    PT Frequency  2x / week    PT Duration  8 weeks    PT Treatment/Interventions  ADLs/Self Care Home Management;Biofeedback;Canalith Repostioning;Therapeutic exercise;Therapeutic activities;Functional mobility training;Manual techniques;Vestibular;Orthotic Fit/Training;Stair training;Gait training;Patient/family education;DME Instruction;Neuromuscular re-education;Balance training    PT Next Visit Plan  Perform 6MWT and write goal as indicated, initiate strengthening, balance, and flexibility HEP.    Consulted and Agree with Plan of Care  Patient       Patient will benefit from skilled therapeutic intervention in order to improve the following deficits and impairments:  Abnormal gait, Decreased endurance, Impaired sensation, Decreased knowledge of use of DME, Decreased strength, Decreased balance, Decreased mobility, Impaired flexibility, Postural dysfunction, Decreased coordination  Visit Diagnosis: Other abnormalities of gait and mobility - Plan: PT plan of care cert/re-cert  Unsteadiness on feet - Plan: PT plan of care cert/re-cert  Other disturbances of skin sensation - Plan: PT plan of care cert/re-cert  Muscle weakness (generalized) - Plan: PT plan of care cert/re-cert  G-Codes - 42/68/34 1448    Functional Assessment Tool Used (Outpatient Only)  FGA: 17/30    Functional Limitation  Mobility: Walking and moving around    Mobility: Walking and Moving Around Current Status [phone])  At least 40 percent but less than 60 percent impaired, limited or restricted    Mobility: Walking and Moving Around Goal Status [phone])  At least 1 percent but less than 20 percent impaired, limited or restricted        Problem List Patient Active Problem List   Diagnosis Date Noted  . COPD mixed type  ([place]) 03/02/2015  . Dyspnea 12/23/2014  . Ex-cigarette smoker 12/23/2014  . Seroma complicating a procedure 09/24/2013  . Mass of arm 08/26/2013  . Atypical facial pain 07/13/2013  . Melanoma of left upper arm ([place]) 03/23/2013    Tasean Mancha L 02/15/2017, 2:49 PM  [facility]  [facility] [address] [place], [place], [address] Phone: [phone]   Fax:  [phone]  Name: TAWYNA PELLOT MRN: [id number] Date of Birth: February 19, 1941   Geoffry Paradise, PT,DPT 02/15/17 2:49 PM Phone: [phone] Fax: [phone]

## 2017-02-27 ENCOUNTER — Ambulatory Visit: Payer: Medicare Other

## 2017-03-01 ENCOUNTER — Ambulatory Visit: Payer: Medicare Other | Attending: Neurology

## 2017-03-01 DIAGNOSIS — R2681 Unsteadiness on feet: Secondary | ICD-10-CM | POA: Diagnosis not present

## 2017-03-01 DIAGNOSIS — M6281 Muscle weakness (generalized): Secondary | ICD-10-CM | POA: Diagnosis not present

## 2017-03-01 DIAGNOSIS — R2689 Other abnormalities of gait and mobility: Secondary | ICD-10-CM | POA: Diagnosis not present

## 2017-03-01 DIAGNOSIS — R208 Other disturbances of skin sensation: Secondary | ICD-10-CM | POA: Diagnosis not present

## 2017-03-01 NOTE — Patient Instructions (Addendum)
  HIP: Hamstrings - Short Sitting    Rest leg on raised surface. Keep knee straight. Lift chest and bend at the hips while keeping back straight. Hold _30__ seconds. Repeat with other leg. __3_ reps per set, _2-3__ sets per day, __7_ days per week  Copyright  VHI. All rights reserved.    Piriformis Stretch, Sitting    Sit, one ankle on opposite knee, same-side hand on crossed knee. Push down on knee, keeping spine straight. Lean torso forward, with flat back, until tension is felt in hamstrings and gluteals of crossed-leg side. Hold _30__ seconds. Repeat with other leg.  Repeat __3_ times per session per leg. Do _2-3__ sessions per day.  Copyright  VHI. All rights reserved.     Walking Program:  Begin walking for exercise for 5 minutes, 1-2 times/day, 5 days/week.   Progress your walking program by adding 1 minute to your routine each week, as tolerated. Be sure to wear good walking shoes, walk in a safe environment and only progress to your tolerance.

## 2017-03-01 NOTE — Therapy (Signed)
[facility] [address] [place], [place], [address] Phone: [phone]   Fax:  [phone]  Physical Therapy Treatment  Patient Details  Name: Brittany Oliver MRN: [id number] Date of Birth: [birth year] Referring Provider: Dr. Tomi Likens   Encounter Date: 03/01/2017  PT End of Session - 03/01/17 1015    Visit Number  2    Number of Visits  17    Date for PT Re-Evaluation  04/16/17    Authorization Type  Medicare and BCBS: G-CODE AND PN EVERY 10TH VISIT.    PT Start Time  [phone] pt late    PT Stop Time  1014    PT Time Calculation (min)  38 min    Activity Tolerance  Patient tolerated treatment well;Treatment limited secondary to medical complications (Comment) seated rest break 2/2 elevated BP    Behavior During Therapy  WFL for tasks assessed/performed       Past Medical History:  Diagnosis Date  . Arthritis   . Cancer ([place]) 02/2013   skin melanoma  . Heart murmur   . Hyperlipidemia   . Hypertension   . Neuralgia facialis vera   . Stroke [facility]) 1987   has a little balance issue since stroke    Past Surgical History:  Procedure Laterality Date  . ABDOMINAL HYSTERECTOMY  1974   pt still has her uterus, took only 1 ovary and 1 tube  . APPENDECTOMY    . COLONOSCOPY    . DILATION AND CURETTAGE OF UTERUS  1965  . EXCISION MELANOMA WITH SENTINEL LYMPH NODE BIOPSY Left 03/27/2013   Procedure: EXCISION MELANOMA LEFT POSTERIOR ARM WITH LEFT AXILLARY SENTINEL LYMPH NODE BIOPSY;  Surgeon: Zenovia Jarred, MD;  Location: [facility];  Service: General;  Laterality: Left;  . EYE SURGERY Bilateral 2006   cataract and laser surgery  . FINGER SURGERY Right    middle finger  . MASS EXCISION Left 09/15/2013   Procedure: EXCISION MASS LEFT UPPER ARM;  Surgeon: Zenovia Jarred, MD;  Location: [facility];  Service: General;  Laterality: Left;  . TONSILLECTOMY      There were no vitals filed for this visit.  Subjective Assessment -  03/01/17 0938    Subjective  Pt denied falls or changes since last visit.     Pertinent History  COPD, hx of CVA in 1987, arthritis, HTN, HLD, heart murmur, peripheral neuropathy in B feet, atypical face pain,     Patient Stated Goals  To just feel more secure when I'm walking and to get stronger.     Currently in Pain?  Yes    Pain Score  -- 1-2/10    Pain Location  Back    Pain Orientation  Lower    Pain Descriptors / Indicators  Aching    Pain Type  Chronic pain    Pain Onset  More than a month ago    Pain Frequency  Intermittent    Aggravating Factors   lifting heavy objects and being really active (yardwork, bending, etc), stairs    Pain Relieving Factors  medication         [facility] PT Assessment - 03/01/17 0941      6 Minute Walk- Baseline   6 Minute Walk- Baseline  yes    BP (mmHg)  165/75    HR (bpm)  78    02 Sat (%RA)  98 %    Modified Borg Scale for Dyspnea  0- Nothing at all  Perceived Rate of Exertion (Borg)  6-      6 Minute walk- Post Test   6 Minute Walk Post Test  yes    BP (mmHg)  182/79    HR (bpm)  88    02 Sat (%RA)  99 %    Modified Borg Scale for Dyspnea  3- Moderate shortness of breath or breathing difficulty    Perceived Rate of Exertion (Borg)  11- Fairly light      6 minute walk test results    Aerobic Endurance Distance Walked  1443    Endurance additional comments  No rest breaks needed but pt did experience 3 episodes of incr. postural sway while traversing turns on track. BP:  161/72 and HR: 79 after seated rest break.       Pt required seated rest break after 6MWT to allow BP to decr.         Therex: Pt performed seated B LE stretches, with cues and demo for technique. Please see pt instructions for HEP details. No c/o pain during session.           PT Education - 03/01/17 1015    Education provided  Yes    Education Details  PT provided pt with stretching HEP and walking program. PT encouraged pt to take BP once she gets  home, as it was elevated during session but pt denied chest pain, HA, weakness, etc. Pt reported she was rushed getting here as she got lost and had just taken BP medication prior to appt.     Person(s) Educated  Patient    Methods  Explanation;Handout    Comprehension  Verbalized understanding       PT Short Term Goals - 03/01/17 1153      PT SHORT TERM GOAL #1   Title  Pt will be IND in HEP in order to improve strength, balance, endurance, and flexibility. TARGET DATE FOR ALL STGS: 03/15/17    Status  New      PT SHORT TERM GOAL #2   Title  Pt will improve FGA score to >/=21/30 to decr. falls risk.     Status  New      PT SHORT TERM GOAL #3   Title  Pt will amb. 300', IND, over even terrain without LOB to improve functional mobility.     Status  New      PT SHORT TERM GOAL #4   Title  Perform 6MWT and write goal as indicated.     Status  Achieved        PT Long Term Goals - 03/01/17 1153      PT LONG TERM GOAL #1   Title  Pt will amb. 1000' over even/uneven terrain, at MOD I level, without LOB to improve functional mobility. TARGET DATE FOR ALL LTGS: 04/12/17    Status  New      PT LONG TERM GOAL #2   Title  Pt will improve FGA score to >/=25/30 to decr. falls risk.     Status  New      PT LONG TERM GOAL #3   Title  Pt will improve 6MWT distance to >/=1545' to improve endurance and to be WNL for age group and gender.     Baseline  1443'    Status  Revised            Plan - 03/01/17 1016    Clinical Impression Statement  Today's skilled session focus on establishing stretching  HEP and walking program. Session limited 2/2 elevated systolic BP, especially after 6MWT. Pt denied s/s of cardiac or stroke but reported she had just taken BP meds prior to appt and was stressed 2/2 to getting lost on way to appt. Pt's 6MWT distance (1443')was below norms for her age group and gender (17'). Pt would benefit from skilled PT to improve safety during functional mobility and to  improve endurance.     Rehab Potential  Good    PT Frequency  2x / week    PT Duration  8 weeks    PT Treatment/Interventions  ADLs/Self Care Home Management;Biofeedback;Canalith Repostioning;Therapeutic exercise;Therapeutic activities;Functional mobility training;Manual techniques;Vestibular;Orthotic Fit/Training;Stair training;Gait training;Patient/family education;DME Instruction;Neuromuscular re-education;Balance training    PT Next Visit Plan  Check BP. initiate strengthening, balance, and flexibility HEP.    Consulted and Agree with Plan of Care  Patient       Patient will benefit from skilled therapeutic intervention in order to improve the following deficits and impairments:  Abnormal gait, Decreased endurance, Impaired sensation, Decreased knowledge of use of DME, Decreased strength, Decreased balance, Decreased mobility, Impaired flexibility, Postural dysfunction, Decreased coordination  Visit Diagnosis: Other abnormalities of gait and mobility  Unsteadiness on feet  Other disturbances of skin sensation  Muscle weakness (generalized)     Problem List Patient Active Problem List   Diagnosis Date Noted  . COPD mixed type ([place]) 03/02/2015  . Dyspnea 12/23/2014  . Ex-cigarette smoker 12/23/2014  . Seroma complicating a procedure 09/24/2013  . Mass of arm 08/26/2013  . Atypical facial pain 07/13/2013  . Melanoma of left upper arm ([place]) 03/23/2013    Josie Burleigh L 03/01/2017, 11:55 AM  [facility] [address] [place], [place], [address] Phone: [phone]   Fax:  [phone]  Name: Brittany Oliver MRN: [id number] Date of Birth: [birth year]  Geoffry Paradise, PT,DPT 03/01/17 11:56 AM Phone: [phone] Fax: [phone]

## 2017-03-06 ENCOUNTER — Ambulatory Visit: Payer: Medicare Other

## 2017-03-06 VITALS — BP 164/71 | HR 76

## 2017-03-06 DIAGNOSIS — R2689 Other abnormalities of gait and mobility: Secondary | ICD-10-CM

## 2017-03-06 DIAGNOSIS — R2681 Unsteadiness on feet: Secondary | ICD-10-CM

## 2017-03-06 DIAGNOSIS — M6281 Muscle weakness (generalized): Secondary | ICD-10-CM | POA: Diagnosis not present

## 2017-03-06 DIAGNOSIS — R208 Other disturbances of skin sensation: Secondary | ICD-10-CM | POA: Diagnosis not present

## 2017-03-06 NOTE — Patient Instructions (Addendum)
  Perform in corner with chair in front of you OR at kitchen sink with chair behind you:  Feet Together (Compliant Surface) Head Motion - Eyes Open    With eyes open, standing on compliant surface: _pillows/cushions_______, feet together, move head slowly: up and down 5 times and side to side 5 times. Repeat __3__ times per session. Do __1__ sessions per day.  Copyright  VHI. All rights reserved.  Feet Together (Compliant Surface) Varied Arm Positions - Eyes Closed    Stand on compliant surface: __pillows/cushions______ with feet together and arms at your side. Close eyes and visualize upright position. Hold__10-30__ seconds. Repeat __3__ times per session. Do __1__ sessions per day.  Copyright  VHI. All rights reserved.  Single Leg - Eyes Open    Holding support, lift right leg while maintaining balance over other leg. Progress to removing hands from support surface for longer periods of time. Hold__10__ seconds. Repeat with other leg. Repeat __3__ times per session per leg. Do ___1_ sessions per day.  Copyright  VHI. All rights reserved.

## 2017-03-06 NOTE — Therapy (Signed)
[facility] [address] [place], [place], [address] Phone: [phone]   Fax:  [phone]  Physical Therapy Treatment  Patient Details  Name: Brittany Oliver MRN: [id number] Date of Birth: 1940/04/04 Referring Provider: Dr. Tomi Likens   Encounter Date: 03/06/2017  PT End of Session - 03/06/17 1056    Visit Number  3    Number of Visits  17    Date for PT Re-Evaluation  04/16/17    Authorization Type  Medicare and BCBS: G-CODE AND PN EVERY 10TH VISIT.    PT Start Time  1029 pt arrived late    PT Stop Time  1054    PT Time Calculation (min)  25 min    Equipment Utilized During Treatment  -- S for safety    Activity Tolerance  Patient tolerated treatment well    Behavior During Therapy  WFL for tasks assessed/performed       Past Medical History:  Diagnosis Date  . Arthritis   . Cancer ([place]) 02/2013   skin melanoma  . Heart murmur   . Hyperlipidemia   . Hypertension   . Neuralgia facialis vera   . Stroke [facility]) 1987   has a little balance issue since stroke    Past Surgical History:  Procedure Laterality Date  . ABDOMINAL HYSTERECTOMY  1974   pt still has her uterus, took only 1 ovary and 1 tube  . APPENDECTOMY    . COLONOSCOPY    . DILATION AND CURETTAGE OF UTERUS  1965  . EXCISION MELANOMA WITH SENTINEL LYMPH NODE BIOPSY Left 03/27/2013   Procedure: EXCISION MELANOMA LEFT POSTERIOR ARM WITH LEFT AXILLARY SENTINEL LYMPH NODE BIOPSY;  Surgeon: Zenovia Jarred, MD;  Location: [facility];  Service: General;  Laterality: Left;  . EYE SURGERY Bilateral 2006   cataract and laser surgery  . FINGER SURGERY Right    middle finger  . MASS EXCISION Left 09/15/2013   Procedure: EXCISION MASS LEFT UPPER ARM;  Surgeon: Zenovia Jarred, MD;  Location: [facility];  Service: General;  Laterality: Left;  . TONSILLECTOMY      Vitals:   03/06/17 1032 03/06/17 1035  BP: (!) 169/78 (!) 164/71  Pulse: 79 76    Subjective  Assessment - 03/06/17 1031    Subjective  Pt denied falls or changes since last visit.     Pertinent History  COPD, hx of CVA in 1987, arthritis, HTN, HLD, heart murmur, peripheral neuropathy in B feet, atypical face pain,     Patient Stated Goals  To just feel more secure when I'm walking and to get stronger.     Currently in Pain?  No/denies                           Balance Exercises - 03/06/17 1054      Balance Exercises: Standing   Standing Eyes Opened  Narrow base of support (BOS);Wide (BOA);Head turns;Foam/compliant surface;Solid surface;3 reps;10 secs;30 secs    Standing Eyes Closed  Narrow base of support (BOS);Wide (BOA);Head turns;Foam/compliant surface;Solid surface;3 reps;10 secs;30 secs    SLS  Eyes open;Upper extremity support 1;Solid surface;3 reps;10 secs    Other Standing Exercises  Pt performed in corner with chair in front for safety. Cues and demo for technique. Performed with S for safety. Please see pt instructions for HEP details.         PT Education - 03/06/17 1055    Education  provided  Yes    Education Details  PT provided pt with balance activities. PT also encouraged pt to have front desk re-print schedule, as pt has been late to most appt's due to confusion about appt. time. PT discussed elevated systolic BP and pt reported it is not elevated at home but PT reiterated the importance of notify MD if BP remains elevated.     Person(s) Educated  Patient    Methods  Explanation;Demonstration;Verbal cues;Handout    Comprehension  Returned demonstration;Verbalized understanding       PT Short Term Goals - 03/01/17 1153      PT SHORT TERM GOAL #1   Title  Pt will be IND in HEP in order to improve strength, balance, endurance, and flexibility. TARGET DATE FOR ALL STGS: 03/15/17    Status  New      PT SHORT TERM GOAL #2   Title  Pt will improve FGA score to >/=21/30 to decr. falls risk.     Status  New      PT SHORT TERM GOAL #3   Title   Pt will amb. 300', IND, over even terrain without LOB to improve functional mobility.     Status  New      PT SHORT TERM GOAL #4   Title  Perform 6MWT and write goal as indicated.     Status  Achieved        PT Long Term Goals - 03/01/17 1153      PT LONG TERM GOAL #1   Title  Pt will amb. 1000' over even/uneven terrain, at MOD I level, without LOB to improve functional mobility. TARGET DATE FOR ALL LTGS: 04/12/17    Status  New      PT LONG TERM GOAL #2   Title  Pt will improve FGA score to >/=25/30 to decr. falls risk.     Status  New      PT LONG TERM GOAL #3   Title  Pt will improve 6MWT distance to >/=1545' to improve endurance and to be WNL for age group and gender.     Baseline  1443'    Status  Revised            Plan - 03/06/17 1057    Clinical Impression Statement  Today's skilled session focused on improving pt's balance and establishing balance HEP. Pt experienced incr. postural sway during activities which require incr. vestibular input. Pt would continue to benefit from skilled PT to improve safety during functional mobility.     Rehab Potential  Good    PT Frequency  2x / week    PT Duration  8 weeks    PT Treatment/Interventions  ADLs/Self Care Home Management;Biofeedback;Canalith Repostioning;Therapeutic exercise;Therapeutic activities;Functional mobility training;Manual techniques;Vestibular;Orthotic Fit/Training;Stair training;Gait training;Patient/family education;DME Instruction;Neuromuscular re-education;Balance training    PT Next Visit Plan  Check BP. initiate strengthening and flexibility HEP.    Consulted and Agree with Plan of Care  Patient       Patient will benefit from skilled therapeutic intervention in order to improve the following deficits and impairments:  Abnormal gait, Decreased endurance, Impaired sensation, Decreased knowledge of use of DME, Decreased strength, Decreased balance, Decreased mobility, Impaired flexibility, Postural  dysfunction, Decreased coordination  Visit Diagnosis: Unsteadiness on feet  Other abnormalities of gait and mobility     Problem List Patient Active Problem List   Diagnosis Date Noted  . COPD mixed type ([place]) 03/02/2015  . Dyspnea 12/23/2014  . Ex-cigarette smoker 12/23/2014  .  Seroma complicating a procedure 09/24/2013  . Mass of arm 08/26/2013  . Atypical facial pain 07/13/2013  . Melanoma of left upper arm ([place]) 03/23/2013    Nyesha Cliff L 03/06/2017, 10:59 AM  [facility] [address] [place], [place], [address] Phone: [phone]   Fax:  [phone]  Name: NICHOLETTE DOLSON MRN: [id number] Date of Birth: November 27, 1940  Geoffry Paradise, PT,DPT 03/06/17 10:59 AM Phone: [phone] Fax: [phone]

## 2017-03-08 ENCOUNTER — Ambulatory Visit: Payer: Medicare Other

## 2017-03-08 DIAGNOSIS — M6281 Muscle weakness (generalized): Secondary | ICD-10-CM | POA: Diagnosis not present

## 2017-03-08 DIAGNOSIS — R2681 Unsteadiness on feet: Secondary | ICD-10-CM | POA: Diagnosis not present

## 2017-03-08 DIAGNOSIS — R2689 Other abnormalities of gait and mobility: Secondary | ICD-10-CM | POA: Diagnosis not present

## 2017-03-08 DIAGNOSIS — R208 Other disturbances of skin sensation: Secondary | ICD-10-CM | POA: Diagnosis not present

## 2017-03-08 NOTE — Therapy (Signed)
[facility] [address] [place], [place], [address] Phone: [phone]   Fax:  [phone]  Physical Therapy Treatment  Patient Details  Name: Brittany Oliver MRN: [id number] Date of Birth: 01/05/1941 Referring Provider: Dr. Tomi Likens   Encounter Date: 03/08/2017  PT End of Session - 03/08/17 1231    Visit Number  4    Number of Visits  17    Date for PT Re-Evaluation  04/16/17    Authorization Type  Medicare and BCBS: G-CODE AND PN EVERY 10TH VISIT.    PT Start Time  1145    PT Stop Time  1229    PT Time Calculation (min)  44 min    Activity Tolerance  Patient tolerated treatment well    Behavior During Therapy  WFL for tasks assessed/performed       Past Medical History:  Diagnosis Date  . Arthritis   . Cancer ([place]) 02/2013   skin melanoma  . Heart murmur   . Hyperlipidemia   . Hypertension   . Neuralgia facialis vera   . Stroke [facility]) 1987   has a little balance issue since stroke    Past Surgical History:  Procedure Laterality Date  . ABDOMINAL HYSTERECTOMY  1974   pt still has her uterus, took only 1 ovary and 1 tube  . APPENDECTOMY    . COLONOSCOPY    . DILATION AND CURETTAGE OF UTERUS  1965  . EXCISION MELANOMA WITH SENTINEL LYMPH NODE BIOPSY Left 03/27/2013   Procedure: EXCISION MELANOMA LEFT POSTERIOR ARM WITH LEFT AXILLARY SENTINEL LYMPH NODE BIOPSY;  Surgeon: Zenovia Jarred, MD;  Location: [facility];  Service: General;  Laterality: Left;  . EYE SURGERY Bilateral 2006   cataract and laser surgery  . FINGER SURGERY Right    middle finger  . MASS EXCISION Left 09/15/2013   Procedure: EXCISION MASS LEFT UPPER ARM;  Surgeon: Zenovia Jarred, MD;  Location: [facility];  Service: General;  Laterality: Left;  . TONSILLECTOMY      There were no vitals filed for this visit.  Subjective Assessment - 03/08/17 1147    Subjective  Pt denied falls or changes since last visit.     Pertinent History  COPD, hx  of CVA in 1987, arthritis, HTN, HLD, heart murmur, peripheral neuropathy in B feet, atypical face pain,     Patient Stated Goals  To just feel more secure when I'm walking and to get stronger.     Currently in Pain?  No/denies          Therex: Pt performed flexibility and strengthening HEP with cues and demo for technique. Performed with S for safety. Please see pt instructions for HEP details.                     PT Education - 03/08/17 1230    Education provided  Yes    Education Details  PT modified piriformis stretch to supine from seated. PT added additional flexibility and strengthening HEP.     Person(s) Educated  Patient    Methods  Explanation;Demonstration;Verbal cues;Handout    Comprehension  Returned demonstration;Verbalized understanding       PT Short Term Goals - 03/01/17 1153      PT SHORT TERM GOAL #1   Title  Pt will be IND in HEP in order to improve strength, balance, endurance, and flexibility. TARGET DATE FOR ALL STGS: 03/15/17    Status  New  PT SHORT TERM GOAL #2   Title  Pt will improve FGA score to >/=21/30 to decr. falls risk.     Status  New      PT SHORT TERM GOAL #3   Title  Pt will amb. 300', IND, over even terrain without LOB to improve functional mobility.     Status  New      PT SHORT TERM GOAL #4   Title  Perform 6MWT and write goal as indicated.     Status  Achieved        PT Long Term Goals - 03/01/17 1153      PT LONG TERM GOAL #1   Title  Pt will amb. 1000' over even/uneven terrain, at MOD I level, without LOB to improve functional mobility. TARGET DATE FOR ALL LTGS: 04/12/17    Status  New      PT LONG TERM GOAL #2   Title  Pt will improve FGA score to >/=25/30 to decr. falls risk.     Status  New      PT LONG TERM GOAL #3   Title  Pt will improve 6MWT distance to >/=1545' to improve endurance and to be WNL for age group and gender.     Baseline  1443'    Status  Revised            Plan -  03/08/17 1231    Clinical Impression Statement  Today's skilled session focused on flexibility and strengthening HEP. Pt tolerated well and did not c/o pain or fatigue. Continue with POC.     Rehab Potential  Good    PT Frequency  2x / week    PT Duration  8 weeks    PT Treatment/Interventions  ADLs/Self Care Home Management;Biofeedback;Canalith Repostioning;Therapeutic exercise;Therapeutic activities;Functional mobility training;Manual techniques;Vestibular;Orthotic Fit/Training;Stair training;Gait training;Patient/family education;DME Instruction;Neuromuscular re-education;Balance training    PT Next Visit Plan  Check BP. Begin to check STGs.     Consulted and Agree with Plan of Care  Patient       Patient will benefit from skilled therapeutic intervention in order to improve the following deficits and impairments:  Abnormal gait, Decreased endurance, Impaired sensation, Decreased knowledge of use of DME, Decreased strength, Decreased balance, Decreased mobility, Impaired flexibility, Postural dysfunction, Decreased coordination  Visit Diagnosis: Muscle weakness (generalized)  Other abnormalities of gait and mobility     Problem List Patient Active Problem List   Diagnosis Date Noted  . COPD mixed type ([place]) 03/02/2015  . Dyspnea 12/23/2014  . Ex-cigarette smoker 12/23/2014  . Seroma complicating a procedure 09/24/2013  . Mass of arm 08/26/2013  . Atypical facial pain 07/13/2013  . Melanoma of left upper arm ([place]) 03/23/2013    Orvie Caradine L 03/08/2017, 12:33 PM  [facility] [address] [place], [place], [address] Phone: [phone]   Fax:  [phone]  Name: Brittany Oliver MRN: [id number] Date of Birth: Jul 25, 1940  Geoffry Paradise, PT,DPT 03/08/17 12:34 PM Phone: [phone] Fax: [phone]

## 2017-03-08 NOTE — Patient Instructions (Addendum)
Piriformis Stretch, Supine    Keep one foot flat on mat. Lie supine, one ankle crossed onto opposite knee.  Hold _30__ seconds. Gently push top knee away from body.  Repeat _3__ times per session per leg. Do _2-3__ sessions per day. PROGRESSION: Holding bottom leg behind knee, gently pull legs toward chest until stretch is felt in buttock of top leg.  Copyright  VHI. All rights reserved.   Lower Trunk Rotation Stretch    Keeping back flat and feet together, rotate knees to left side. Hold __30__ seconds. Then repeat to the right side and hold 30 seconds.  Repeat __3__ times per set each side. Do __1__ sets per session. Do __2-3__ sessions per day.  http://orth.exer.us/123   Copyright  VHI. All rights reserved.    Walking on Heels    Walk on heels for __10_ feet while continuing on a straight path, holding counter as needed. Perform 4 times at counter.  Do _3__ sessions per week.  Copyright  VHI. All rights reserved.  Walking on Toes    Walk on toes for __10__ feet while continuing on a straight path, while holding counter as needed. Perform 4 times at counter.  Do __3__ sessions per week.  Copyright  VHI. All rights reserved.   Mini-Squats (Standing)    Stand with support. Bend knees slightly, make sure your knees don't go past your toes (stick bottom out like you're going to sit in a chair).  Hold for _2__ seconds. Return to straight standing.  Repeat __10_ times. Perform 3 sets per session. Do __3_ times a week.  Copyright  VHI. All rights reserved.    Abduction: Clam - Side-Lying    Lie on side with knees bent. Lift top knee, keeping feet together. Keep trunk steady. Slowly lower leg. __10_ reps per set, __3_ sets per day, _3__ days per week. Repeat with each leg.  http://ecce.exer.us/65   Copyright  VHI. All rights reserved.   Hip Extension (Standing)    Stand with support. Move right leg backward with straight knee. Hold for _2__ seconds.    Repeat __10_ times. Perform on each leg. Do __3_ times per session. Perform 3 times a week.   Copyright  VHI. All rights reserved.

## 2017-03-13 ENCOUNTER — Ambulatory Visit: Payer: Medicare Other

## 2017-03-13 VITALS — BP 158/70 | HR 70

## 2017-03-13 DIAGNOSIS — R2681 Unsteadiness on feet: Secondary | ICD-10-CM | POA: Diagnosis not present

## 2017-03-13 DIAGNOSIS — R208 Other disturbances of skin sensation: Secondary | ICD-10-CM | POA: Diagnosis not present

## 2017-03-13 DIAGNOSIS — R2689 Other abnormalities of gait and mobility: Secondary | ICD-10-CM

## 2017-03-13 DIAGNOSIS — M6281 Muscle weakness (generalized): Secondary | ICD-10-CM | POA: Diagnosis not present

## 2017-03-13 NOTE — Patient Instructions (Signed)
HIP: Hamstrings - Short Sitting    Rest leg on raised surface. Keep knee straight. Lift chest and bend at the hips while keeping back straight. Hold _30__ seconds. Repeat with other leg. __3_ reps per set, _2-3__ sets per day, __7_ days per week  Copyright  VHI. All rights reserved.  Piriformis Stretch, Supine    Keep one foot flat on mat. Lie supine, one ankle crossed onto opposite knee.  Hold _30__ seconds. Gently push top knee away from body.  Repeat _3__ times per session per leg. Do _2-3__ sessions per day. PROGRESSION: Holding bottom leg behind knee, gently pull legs toward chest until stretch is felt in buttock of top leg.  Copyright  VHI. All rights reserved.   Lower Trunk Rotation Stretch    Keeping back flat and feet together, rotate knees to left side. Hold __30__ seconds. Then repeat to the right side and hold 30 seconds.  Repeat __3__ times per set each side. Do __1__ sets per session. Do __2-3__ sessions per day.  http://orth.exer.us/123   Copyright  VHI. All rights reserved.

## 2017-03-13 NOTE — Therapy (Signed)
[facility] [address] [place], [place], [address] Phone: [phone]   Fax:  [phone]  Physical Therapy Treatment  Patient Details  Name: Brittany Oliver MRN: [id number] Date of Birth: 02/01/1941 Referring Provider: Dr. Jaffe   Encounter Date: 03/13/2017  PT End of Session - 03/13/17 1157    Visit Number  5    Number of Visits  17    Date for PT Re-Evaluation  04/16/17    Authorization Type  Medicare and BCBS: G-CODE AND PN EVERY 10TH VISIT.    PT Start Time  1020    PT Stop Time  1059    PT Time Calculation (min)  39 min    Equipment Utilized During Treatment  -- min guard to S prn    Activity Tolerance  Patient tolerated treatment well    Behavior During Therapy  WFL for tasks assessed/performed       Past Medical History:  Diagnosis Date  . Arthritis   . Cancer (HCC) 02/2013   skin melanoma  . Heart murmur   . Hyperlipidemia   . Hypertension   . Neuralgia facialis vera   . Stroke (HCC) 1987   has a little balance issue since stroke    Past Surgical History:  Procedure Laterality Date  . ABDOMINAL HYSTERECTOMY  1974   pt still has her uterus, took only 1 ovary and 1 tube  . APPENDECTOMY    . COLONOSCOPY    . DILATION AND CURETTAGE OF UTERUS  1965  . EXCISION MELANOMA WITH SENTINEL LYMPH NODE BIOPSY Left 03/27/2013   Procedure: EXCISION MELANOMA LEFT POSTERIOR ARM WITH LEFT AXILLARY SENTINEL LYMPH NODE BIOPSY;  Surgeon: Burke E Thompson, MD;  Location: [facility] OR;  Service: General;  Laterality: Left;  . EYE SURGERY Bilateral 2006   cataract and laser surgery  . FINGER SURGERY Right    middle finger  . MASS EXCISION Left 09/15/2013   Procedure: EXCISION MASS LEFT UPPER ARM;  Surgeon: Burke E Thompson, MD;  Location: [facility] OR;  Service: General;  Laterality: Left;  . TONSILLECTOMY      Vitals:   03/13/17 1025  BP: (!) 158/70  Pulse: 70    Subjective Assessment - 03/13/17 1023    Subjective   Pt denied falls or changes since last visit.     Pertinent History  COPD, hx of CVA in 1987, arthritis, HTN, HLD, heart murmur, peripheral neuropathy in B feet, atypical face pain,     Patient Stated Goals  To just feel more secure when I'm walking and to get stronger.     Currently in Pain?  No/denies         [facility] PT Assessment - 03/13/17 1027      6 Minute Walk- Baseline   6 Minute Walk- Baseline  yes    BP (mmHg)  158/70    HR (bpm)  65    02 Sat (%RA)  96 %    Modified Borg Scale for Dyspnea  0- Nothing at all    Perceived Rate of Exertion (Borg)  6-      6 Minute walk- Post Test   6 Minute Walk Post Test  yes    BP (mmHg)  168/75    HR (bpm)  77    02 Sat (%RA)  98 %    Modified Borg Scale for Dyspnea  1- Very mild shortness of breath    Perceived Rate of Exertion (Borg)  9- very light        6 minute walk test results    Aerobic Endurance Distance Walked  1445    Endurance additional comments  No rest breaks required or LOB noted.       Functional Gait  Assessment   Gait assessed   Yes    Gait Level Surface  Walks 20 ft in less than 5.5 sec, no assistive devices, good speed, no evidence for imbalance, normal gait pattern, deviates no more than 6 in outside of the 12 in walkway width.    Change in Gait Speed  Able to change speed, demonstrates mild gait deviations, deviates 6-10 in outside of the 12 in walkway width, or no gait deviations, unable to achieve a major change in velocity, or uses a change in velocity, or uses an assistive device.    Gait with Horizontal Head Turns  Performs head turns smoothly with slight change in gait velocity (eg, minor disruption to smooth gait path), deviates 6-10 in outside 12 in walkway width, or uses an assistive device.    Gait with Vertical Head Turns  Performs task with slight change in gait velocity (eg, minor disruption to smooth gait path), deviates 6 - 10 in outside 12 in walkway width or uses assistive device    Gait and Pivot Turn   Pivot turns safely within 3 sec and stops quickly with no loss of balance.    Step Over Obstacle  Is able to step over 2 stacked shoe boxes taped together (9 in total height) without changing gait speed. No evidence of imbalance.    Gait with Narrow Base of Support  Ambulates less than 4 steps heel to toe or cannot perform without assistance.    Gait with Eyes Closed  Walks 20 ft, uses assistive device, slower speed, mild gait deviations, deviates 6-10 in outside 12 in walkway width. Ambulates 20 ft in less than 9 sec but greater than 7 sec. 8.4 sec.    Ambulating Backwards  Walks 20 ft, uses assistive device, slower speed, mild gait deviations, deviates 6-10 in outside 12 in walkway width.    Steps  Alternating feet, must use rail.    Total Score  21    FGA comment:  21/30: indicates pt is at moderate falls risk.          Therex: Pt performed stretching HEP, please see pt instructions for HEP details. Performed with S for safety and cues for technique during B hamstring stretch.         Included in neuro re-ed. [facility] Adult PT Treatment/Exercise - 03/13/17 1034      Ambulation/Gait   Ambulation/Gait  Yes    Ambulation/Gait Assistance  7: Independent    Ambulation/Gait Assistance Details  No overt LOB over even terrain.    Ambulation Distance (Feet)  345 Feet    Assistive device  None    Gait Pattern  Step-through pattern;Decreased stride length;Narrow base of support;Wide base of support intermittent wide to narrow BOS    Ambulation Surface  Level;Indoor    Gait velocity  4.19 ft/sec and 3.95ft/sec. no AD or LOB.              PT Education - 03/13/17 1157    Education provided  Yes    Education Details  PT reviewed stretching HEP with pt and provided cues as indicated. PT discussed outcome measure results and goal progress.     Person(s) Educated  Patient    Methods  Explanation;Handout    Comprehension  Verbalized understanding;Returned demonstration         PT Short  Term Goals - 03/13/17 1159      PT SHORT TERM GOAL #1   Title  Pt will be IND in HEP in order to improve strength, balance, endurance, and flexibility. TARGET DATE FOR ALL STGS: 03/15/17    Status  New      PT SHORT TERM GOAL #2   Title  Pt will improve FGA score to >/=21/30 to decr. falls risk.     Status  Achieved      PT SHORT TERM GOAL #3   Title  Pt will amb. 300', IND, over even terrain without LOB to improve functional mobility.     Status  Achieved      PT SHORT TERM GOAL #4   Title  Perform 6MWT and write goal as indicated.     Status  Achieved        PT Long Term Goals - 03/01/17 1153      PT LONG TERM GOAL #1   Title  Pt will amb. 1000' over even/uneven terrain, at MOD I level, without LOB to improve functional mobility. TARGET DATE FOR ALL LTGS: 04/12/17    Status  New      PT LONG TERM GOAL #2   Title  Pt will improve FGA score to >/=25/30 to decr. falls risk.     Status  New      PT LONG TERM GOAL #3   Title  Pt will improve 6MWT distance to >/=1545' to improve endurance and to be WNL for age group and gender.     Baseline  1443'    Status  Revised            Plan - 03/13/17 1158    Clinical Impression Statement  Pt demonstrated progress, as she met STGs 2 and 3. PT will finish assessing STG 1 next session. Pt's FGA score indicates pt is at moderate risk for falls. Continue with POC.     Rehab Potential  Good    PT Frequency  2x / week    PT Duration  8 weeks    PT Treatment/Interventions  ADLs/Self Care Home Management;Biofeedback;Canalith Repostioning;Therapeutic exercise;Therapeutic activities;Functional mobility training;Manual techniques;Vestibular;Orthotic Fit/Training;Stair training;Gait training;Patient/family education;DME Instruction;Neuromuscular re-education;Balance training    PT Next Visit Plan  Check BP. Finish assessing STG 1 (HEP) and progress as tolerated.    Consulted and Agree with Plan of Care  Patient       Patient will benefit  from skilled therapeutic intervention in order to improve the following deficits and impairments:  Abnormal gait, Decreased endurance, Impaired sensation, Decreased knowledge of use of DME, Decreased strength, Decreased balance, Decreased mobility, Impaired flexibility, Postural dysfunction, Decreased coordination  Visit Diagnosis: Other abnormalities of gait and mobility  Muscle weakness (generalized)  Unsteadiness on feet  Other disturbances of skin sensation     Problem List Patient Active Problem List   Diagnosis Date Noted  . COPD mixed type ([place]) 03/02/2015  . Dyspnea 12/23/2014  . Ex-cigarette smoker 12/23/2014  . Seroma complicating a procedure 09/24/2013  . Mass of arm 08/26/2013  . Atypical facial pain 07/13/2013  . Melanoma of left upper arm ([place]) 03/23/2013    Lorece Keach L 03/13/2017, 11:59 AM  [facility] [address] [place], [place], [address] Phone: [phone]   Fax:  [phone]  Name: Brittany Oliver MRN: [id number] Date of Birth: 03/03/1941  Geoffry Paradise, PT,DPT 03/13/17 12:01 PM Phone: [phone] Fax: [phone]

## 2017-03-14 ENCOUNTER — Ambulatory Visit: Payer: Medicare Other

## 2017-03-14 VITALS — BP 166/74 | HR 67

## 2017-03-14 DIAGNOSIS — M6281 Muscle weakness (generalized): Secondary | ICD-10-CM

## 2017-03-14 DIAGNOSIS — R2689 Other abnormalities of gait and mobility: Secondary | ICD-10-CM

## 2017-03-14 DIAGNOSIS — R208 Other disturbances of skin sensation: Secondary | ICD-10-CM | POA: Diagnosis not present

## 2017-03-14 DIAGNOSIS — R2681 Unsteadiness on feet: Secondary | ICD-10-CM | POA: Diagnosis not present

## 2017-03-14 NOTE — Therapy (Signed)
[facility] [address] [place], [place], [address] Phone: [phone]   Fax:  [phone]  Physical Therapy Treatment  Patient Details  Name: Brittany Oliver MRN: [id number] Date of Birth: 01/06/1941 Referring Provider: Dr. Tomi Likens   Encounter Date: 03/14/2017  PT End of Session - 03/14/17 1217    Visit Number  6    Number of Visits  17    Date for PT Re-Evaluation  04/16/17    Authorization Type  Medicare and BCBS: G-CODE AND PN EVERY 10TH VISIT.    PT Start Time  1019    PT Stop Time  1058    PT Time Calculation (min)  39 min    Equipment Utilized During Treatment  -- min guard to S prn    Activity Tolerance  Patient tolerated treatment well    Behavior During Therapy  WFL for tasks assessed/performed       Past Medical History:  Diagnosis Date  . Arthritis   . Cancer ([place]) 02/2013   skin melanoma  . Heart murmur   . Hyperlipidemia   . Hypertension   . Neuralgia facialis vera   . Stroke [facility]) 1987   has a little balance issue since stroke    Past Surgical History:  Procedure Laterality Date  . ABDOMINAL HYSTERECTOMY  1974   pt still has her uterus, took only 1 ovary and 1 tube  . APPENDECTOMY    . COLONOSCOPY    . DILATION AND CURETTAGE OF UTERUS  1965  . EXCISION MELANOMA WITH SENTINEL LYMPH NODE BIOPSY Left 03/27/2013   Procedure: EXCISION MELANOMA LEFT POSTERIOR ARM WITH LEFT AXILLARY SENTINEL LYMPH NODE BIOPSY;  Surgeon: Zenovia Jarred, MD;  Location: [facility];  Service: General;  Laterality: Left;  . EYE SURGERY Bilateral 2006   cataract and laser surgery  . FINGER SURGERY Right    middle finger  . MASS EXCISION Left 09/15/2013   Procedure: EXCISION MASS LEFT UPPER ARM;  Surgeon: Zenovia Jarred, MD;  Location: [facility];  Service: General;  Laterality: Left;  . TONSILLECTOMY      Vitals:   03/14/17 1022 03/14/17 1048  BP: (!) 158/76 (!) 166/74  Pulse: 72 67    Subjective Assessment -  03/14/17 1022    Subjective  Pt denied falls or changes since last visit.     Pertinent History  COPD, hx of CVA in 1987, arthritis, HTN, HLD, heart murmur, peripheral neuropathy in B feet, atypical face pain,     Patient Stated Goals  To just feel more secure when I'm walking and to get stronger.     Currently in Pain?  No/denies        Therex and Neuro re-ed HEP review: Pt performed in standing and sidelying positions. Cues and demo for technique during corner balance activities and during clamshells. PT progress B hip ext to using yellow band. Performed with S to ensure safety. Pt denied pain during session. Please see pt instructions for HEP details.            [facility] Adult PT Treatment/Exercise - 03/14/17 1049      High Level Balance   High Level Balance Activities  Side stepping;Backward walking;Head turns    High Level Balance Comments  Performed over red and blue mats with cues and demo for technique: 6x20' with intermittent UE support and min guard to S for safety.  PT Education - 03/14/17 1216    Education provided  Yes    Education Details  PT reviewed strengthening and balance HEP and progressed B hip strengthening HEP and provided handout. PT discussed the importance of performing HEP to improve strength, flexibility and balance. Pt reported she'll be able to perform HEP as prescribed since holidays are almost over.     Person(s) Educated  Patient    Methods  Explanation;Demonstration;Verbal cues;Handout    Comprehension  Returned demonstration;Verbalized understanding       PT Short Term Goals - 03/14/17 1219      PT SHORT TERM GOAL #1   Title  Pt will be IND in HEP in order to improve strength, balance, endurance, and flexibility. TARGET DATE FOR ALL STGS: 03/15/17    Status  Partially Met      PT SHORT TERM GOAL #2   Title  Pt will improve FGA score to >/=21/30 to decr. falls risk.     Status  Achieved      PT SHORT TERM GOAL #3   Title   Pt will amb. 300', IND, over even terrain without LOB to improve functional mobility.     Status  Achieved      PT SHORT TERM GOAL #4   Title  Perform 6MWT and write goal as indicated.     Status  Achieved        PT Long Term Goals - 03/01/17 1153      PT LONG TERM GOAL #1   Title  Pt will amb. 1000' over even/uneven terrain, at MOD I level, without LOB to improve functional mobility. TARGET DATE FOR ALL LTGS: 04/12/17    Status  New      PT LONG TERM GOAL #2   Title  Pt will improve FGA score to >/=25/30 to decr. falls risk.     Status  New      PT LONG TERM GOAL #3   Title  Pt will improve 6MWT distance to >/=1545' to improve endurance and to be WNL for age group and gender.     Baseline  1443'    Status  Revised            Plan - 03/14/17 1217    Clinical Impression Statement  Pt partially met STG 1 today, as she required cues during several exercises. Pt experienced incr. postural sway during activities over compliant surfaces, which require incr. vestibular input. Pt's systolic was elevated during session, but did not incr. significantly during exercise and pt denied N/T, weakness, impaired balance, etc. Pt will continue to monitor BP at home. Pt would continue to benefit from skilled PT to improve safety during functional mobility.     Rehab Potential  Good    PT Frequency  2x / week    PT Duration  8 weeks    PT Treatment/Interventions  ADLs/Self Care Home Management;Biofeedback;Canalith Repostioning;Therapeutic exercise;Therapeutic activities;Functional mobility training;Manual techniques;Vestibular;Orthotic Fit/Training;Stair training;Gait training;Patient/family education;DME Instruction;Neuromuscular re-education;Balance training    PT Next Visit Plan  Check BP. High level balance and gait activities.     Consulted and Agree with Plan of Care  Patient       Patient will benefit from skilled therapeutic intervention in order to improve the following deficits and  impairments:  Abnormal gait, Decreased endurance, Impaired sensation, Decreased knowledge of use of DME, Decreased strength, Decreased balance, Decreased mobility, Impaired flexibility, Postural dysfunction, Decreased coordination  Visit Diagnosis: Other abnormalities of gait and mobility  Muscle weakness (  generalized)  Unsteadiness on feet  Other disturbances of skin sensation     Problem List Patient Active Problem List   Diagnosis Date Noted  . COPD mixed type ([place]) 03/02/2015  . Dyspnea 12/23/2014  . Ex-cigarette smoker 12/23/2014  . Seroma complicating a procedure 09/24/2013  . Mass of arm 08/26/2013  . Atypical facial pain 07/13/2013  . Melanoma of left upper arm ([place]) 03/23/2013    Camary Sosa L 03/14/2017, 12:20 PM  [facility] [address] [place], [place], [address] Phone: [phone]   Fax:  [phone]  Name: JULIET VASBINDER MRN: [id number] Date of Birth: September 10, 1940  Geoffry Paradise, PT,DPT 03/14/17 12:22 PM Phone: [phone] Fax: [phone]

## 2017-03-14 NOTE — Patient Instructions (Addendum)
Perform in corner with chair in front of you OR at kitchen sink with chair behind you:  Feet Together (Compliant Surface) Head Motion - Eyes Open    With eyes open, standing on compliant surface: _pillows/cushions_______, feet together, move head slowly: up and down 5 times and side to side 5 times. Repeat __3__ times per session. Do __1__ sessions per day.  Copyright  VHI. All rights reserved.  Feet Together (Compliant Surface) Varied Arm Positions - Eyes Closed    Stand on compliant surface: __pillows/cushions______ with feet together and arms at your side. Close eyes and visualize upright position. Hold__10-30__ seconds. Repeat __3__ times per session. Do __1__ sessions per day.  Copyright  VHI. All rights reserved.  Single Leg - Eyes Open    Holding support, lift right leg while maintaining balance over other leg. Progress to removing hands from support surface for longer periods of time. Hold__10__ seconds. Repeat with other leg. Repeat __3__ times per session per leg. Do ___1_ sessions per day.  Copyright  VHI. All rights reserved.   Walking on Heels    Walk on heels for __10_ feet while continuing on a straight path, holding counter as needed. Perform 4 times at counter.  Do _3__ sessions per week.  Copyright  VHI. All rights reserved.  Walking on Toes    Walk on toes for __10__ feet while continuing on a straight path, while holding counter as needed. Perform 4 times at counter.  Do __3__ sessions per week.  Copyright  VHI. All rights reserved.   Mini-Squats (Standing)    Stand with support. Bend knees slightly, make sure your knees don't go past your toes (stick bottom out like you're going to sit in a chair).  Hold for _2__ seconds. Return to straight standing.  Repeat __10_ times. Perform 3 sets per session. Do __3_ times a week.  Copyright  VHI. All rights reserved.    Abduction: Clam - Side-Lying    Lie on side with knees bent. Lift  top knee, keeping feet together. Keep trunk steady. Slowly lower leg. __10_ reps per set, __3_ sets per day, _3__ days per week. Repeat with each leg.  http://ecce.exer.us/65   Copyright  VHI. All rights reserved.   Hip Extension (Standing)    Tie YELLOW band around ankles. Stand with support. Move right leg backward with straight knee. Hold for _2__ seconds.  Repeat __10_ times. Perform on each leg. Do __3_ times per session. Perform 3 times a week.   Copyright  VHI. All rights reserved.

## 2017-03-25 ENCOUNTER — Ambulatory Visit: Payer: Medicare Other | Attending: Neurology | Admitting: Physical Therapy

## 2017-03-25 ENCOUNTER — Encounter: Payer: Self-pay | Admitting: Physical Therapy

## 2017-03-25 VITALS — BP 152/78

## 2017-03-25 DIAGNOSIS — R208 Other disturbances of skin sensation: Secondary | ICD-10-CM

## 2017-03-25 DIAGNOSIS — R2689 Other abnormalities of gait and mobility: Secondary | ICD-10-CM | POA: Diagnosis not present

## 2017-03-25 DIAGNOSIS — R2681 Unsteadiness on feet: Secondary | ICD-10-CM | POA: Diagnosis not present

## 2017-03-25 DIAGNOSIS — M6281 Muscle weakness (generalized): Secondary | ICD-10-CM | POA: Diagnosis not present

## 2017-03-25 NOTE — Therapy (Signed)
[facility] [address] [place], [place], [address] Phone: [phone]   Fax:  [phone]  Physical Therapy Treatment  Patient Details  Name: Brittany Oliver MRN: [id number] Date of Birth: 07/20/1940 Referring Provider: Dr. Tomi Likens   Encounter Date: 03/25/2017  PT End of Session - 03/25/17 1147    Visit Number  7    Number of Visits  17    Date for PT Re-Evaluation  04/16/17    Authorization Type  Medicare and BCBS: G-CODE AND PN EVERY 10TH VISIT.    PT Start Time  1058    PT Stop Time  1141    PT Time Calculation (min)  43 min    Equipment Utilized During Treatment  Gait belt    Activity Tolerance  Patient tolerated treatment well    Behavior During Therapy  WFL for tasks assessed/performed       Past Medical History:  Diagnosis Date  . Arthritis   . Cancer ([place]) 02/2013   skin melanoma  . Heart murmur   . Hyperlipidemia   . Hypertension   . Neuralgia facialis vera   . Stroke [facility]) 1987   has a little balance issue since stroke    Past Surgical History:  Procedure Laterality Date  . ABDOMINAL HYSTERECTOMY  1974   pt still has her uterus, took only 1 ovary and 1 tube  . APPENDECTOMY    . COLONOSCOPY    . DILATION AND CURETTAGE OF UTERUS  1965  . EXCISION MELANOMA WITH SENTINEL LYMPH NODE BIOPSY Left 03/27/2013   Procedure: EXCISION MELANOMA LEFT POSTERIOR ARM WITH LEFT AXILLARY SENTINEL LYMPH NODE BIOPSY;  Surgeon: Zenovia Jarred, MD;  Location: [facility];  Service: General;  Laterality: Left;  . EYE SURGERY Bilateral 2006   cataract and laser surgery  . FINGER SURGERY Right    middle finger  . MASS EXCISION Left 09/15/2013   Procedure: EXCISION MASS LEFT UPPER ARM;  Surgeon: Zenovia Jarred, MD;  Location: [facility];  Service: General;  Laterality: Left;  . TONSILLECTOMY      Vitals:   03/25/17 1100  BP: (!) 152/78    Subjective Assessment - 03/25/17 1100    Subjective  doing well, no complaints  or falls.  back is a little sore today- thinks it's from putting christmas decorations away.    Patient Stated Goals  To just feel more secure when I'm walking and to get stronger.     Pain Score  2     Pain Location  Back    Pain Orientation  Lower    Pain Descriptors / Indicators  Aching    Pain Type  Chronic pain    Pain Onset  More than a month ago    Pain Frequency  Intermittent    Aggravating Factors   lifting heavy objects and increased activity    Pain Relieving Factors  medications                      [facility] Adult PT Treatment/Exercise - 03/25/17 1107      Exercises   Exercises  Knee/Hip      Knee/Hip Exercises: Standing   Hip Abduction  Both;10 reps;Knee straight    Abduction Limitations  yellow theraband    Hip Extension  Both;10 reps;Knee straight    Extension Limitations  yellow theraband          Balance Exercises - 03/25/17 1132  Balance Exercises: Standing   Rockerboard  Anterior/posterior;Lateral;EO;Head turns;10 reps;Intermittent UE support min A    Balance Beam  sidestepping and tandem gait forward    Gait with Head Turns  Forward;2 reps supervision with ball toss and horizontal/vertical head turn    Other Standing Exercises  amb heels/toes forwards/backwards with minguard A; sidestepping and backwards walking with yellow theraband 10' x 4 with intermittent UE support and minguard A          PT Short Term Goals - 03/14/17 1219      PT SHORT TERM GOAL #1   Title  Pt will be IND in HEP in order to improve strength, balance, endurance, and flexibility. TARGET DATE FOR ALL STGS: 03/15/17    Status  Partially Met      PT SHORT TERM GOAL #2   Title  Pt will improve FGA score to >/=21/30 to decr. falls risk.     Status  Achieved      PT SHORT TERM GOAL #3   Title  Pt will amb. 300', IND, over even terrain without LOB to improve functional mobility.     Status  Achieved      PT SHORT TERM GOAL #4   Title  Perform 6MWT and write  goal as indicated.     Status  Achieved        PT Long Term Goals - 03/01/17 1153      PT LONG TERM GOAL #1   Title  Pt will amb. 1000' over even/uneven terrain, at MOD I level, without LOB to improve functional mobility. TARGET DATE FOR ALL LTGS: 04/12/17    Status  New      PT LONG TERM GOAL #2   Title  Pt will improve FGA score to >/=25/30 to decr. falls risk.     Status  New      PT LONG TERM GOAL #3   Title  Pt will improve 6MWT distance to >/=1545' to improve endurance and to be WNL for age group and gender.     Baseline  1443'    Status  Revised            Plan - 03/25/17 1147    Clinical Impression Statement  Pt tolerated session well today, and reviewed hip extension with yellow theraband.  Pt stated she could tell exercise was targeting area of back pain, but didn't increase pain today.  Advised that if pain increases to stop performing with band and try without.  Progressing well with PT.    PT Treatment/Interventions  ADLs/Self Care Home Management;Biofeedback;Canalith Repostioning;Therapeutic exercise;Therapeutic activities;Functional mobility training;Manual techniques;Vestibular;Orthotic Fit/Training;Stair training;Gait training;Patient/family education;DME Instruction;Neuromuscular re-education;Balance training    PT Next Visit Plan  Check BP. High level balance and gait activities. coordination activities    Consulted and Agree with Plan of Care  Patient       Patient will benefit from skilled therapeutic intervention in order to improve the following deficits and impairments:  Abnormal gait, Decreased endurance, Impaired sensation, Decreased knowledge of use of DME, Decreased strength, Decreased balance, Decreased mobility, Impaired flexibility, Postural dysfunction, Decreased coordination  Visit Diagnosis: Other abnormalities of gait and mobility  Muscle weakness (generalized)  Unsteadiness on feet  Other disturbances of skin sensation     Problem  List Patient Active Problem List   Diagnosis Date Noted  . COPD mixed type ([place]) 03/02/2015  . Dyspnea 12/23/2014  . Ex-cigarette smoker 12/23/2014  . Seroma complicating a procedure 09/24/2013  . Mass of arm  08/26/2013  . Atypical facial pain 07/13/2013  . Melanoma of left upper arm ([place]) 03/23/2013      Laureen Abrahams, PT, DPT 03/25/17 11:49 AM    [facility] [address] [place], [place], [address] Phone: [phone]   Fax:  [phone]  Name: Brittany Oliver MRN: [id number] Date of Birth: 02/09/1941

## 2017-03-29 ENCOUNTER — Ambulatory Visit: Payer: Medicare Other | Admitting: Physical Therapy

## 2017-03-29 ENCOUNTER — Encounter: Payer: Self-pay | Admitting: Physical Therapy

## 2017-03-29 VITALS — BP 130/80 | HR 66

## 2017-03-29 DIAGNOSIS — R2681 Unsteadiness on feet: Secondary | ICD-10-CM | POA: Diagnosis not present

## 2017-03-29 DIAGNOSIS — M6281 Muscle weakness (generalized): Secondary | ICD-10-CM

## 2017-03-29 DIAGNOSIS — R2689 Other abnormalities of gait and mobility: Secondary | ICD-10-CM | POA: Diagnosis not present

## 2017-03-29 DIAGNOSIS — R208 Other disturbances of skin sensation: Secondary | ICD-10-CM | POA: Diagnosis not present

## 2017-03-29 NOTE — Patient Instructions (Signed)
For EYES OPEN, FEET TOGETHER ON PILLOW - increase head nods and head turns to 10 repetitions; add 5 each diagonals  For EYES CLOSED, ON PILLOW - With feet apart - 10 repetitions head nods/turns.  Place feet together and do 5 head nods/turns each.  Feet Heel-Toe "Tandem"    Arms outstretched, one hand touching your countertop - walk a straight line bringing one foot directly in front of the other down and back along countertop. Repeat for 4 laps working on looking straight ahead. Do _2___ sessions per day.  Ways to progress this exercise: Hold heel-toe position and take hand off counter for 10 seconds, each foot forwards Walk heel-toe while performing head turns and head nods every few steps  Copyright  VHI. All rights reserved.

## 2017-03-29 NOTE — Therapy (Signed)
[facility] [address] [place], [place], [address] Phone: [phone]   Fax:  [phone]  Physical Therapy Treatment  Patient Details  Name: Brittany Oliver MRN: [id number] Date of Birth: 1940-08-28 Referring Provider: Dr. Tomi Likens   Encounter Date: 03/29/2017  PT End of Session - 03/29/17 1232    Visit Number  8    Number of Visits  17    Date for PT Re-Evaluation  04/16/17    Authorization Type  Medicare and BCBS    PT Start Time  1017    PT Stop Time  1101    PT Time Calculation (min)  44 min    Activity Tolerance  Patient tolerated treatment well    Behavior During Therapy  [facility] for tasks assessed/performed       Past Medical History:  Diagnosis Date  . Arthritis   . Cancer ([place]) 02/2013   skin melanoma  . Heart murmur   . Hyperlipidemia   . Hypertension   . Neuralgia facialis vera   . Stroke [facility]) 1987   has a little balance issue since stroke    Past Surgical History:  Procedure Laterality Date  . ABDOMINAL HYSTERECTOMY  1974   pt still has her uterus, took only 1 ovary and 1 tube  . APPENDECTOMY    . COLONOSCOPY    . DILATION AND CURETTAGE OF UTERUS  1965  . EXCISION MELANOMA WITH SENTINEL LYMPH NODE BIOPSY Left 03/27/2013   Procedure: EXCISION MELANOMA LEFT POSTERIOR ARM WITH LEFT AXILLARY SENTINEL LYMPH NODE BIOPSY;  Surgeon: Zenovia Jarred, MD;  Location: [facility];  Service: General;  Laterality: Left;  . EYE SURGERY Bilateral 2006   cataract and laser surgery  . FINGER SURGERY Right    middle finger  . MASS EXCISION Left 09/15/2013   Procedure: EXCISION MASS LEFT UPPER ARM;  Surgeon: Zenovia Jarred, MD;  Location: [facility];  Service: General;  Laterality: Left;  . TONSILLECTOMY      Vitals:   03/29/17 1025  BP: 130/80  Pulse: 66  SpO2: 98%    Subjective Assessment - 03/29/17 1021    Subjective  Back is still a little sore, has been doing stretches and walking - "it isn't terrible".   Pt looked at her medications and saw that her breathing inhaler has a side effect of elevated BP; decided not to take it today to assess if it is affecting BP in the am.      Pertinent History  COPD, hx of CVA in 1987, arthritis, HTN, HLD, heart murmur, peripheral neuropathy in B feet, atypical face pain,     Patient Stated Goals  To just feel more secure when I'm walking and to get stronger.     Currently in Pain?  No/denies    Pain Onset  More than a month ago                           Balance Exercises - 03/29/17 1049      Balance Exercises: Standing   Standing Eyes Opened  Narrow base of support (BOS);Head turns;Foam/compliant surface;3 reps 3 sets, 10 reps head nods, turns, diagonals    Standing Eyes Closed  Narrow base of support (BOS);Wide (BOA);Head turns;Foam/compliant surface 5-10 head nods/turns    Wall Bumps  Hip    Wall Bumps-Hips  Eyes opened;Eyes closed;Anterior/posterior;Foam/compliant surface;10 reps feet apart, feet together    Tandem Gait  Forward;Upper extremity  support;5 reps        PT Education - 03/29/17 1231    Education provided  Yes    Education Details  updated and progressed balance HEP    Person(s) Educated  Patient    Methods  Explanation;Demonstration;Handout    Comprehension  Verbalized understanding;Returned demonstration       PT Short Term Goals - 03/14/17 1219      PT SHORT TERM GOAL #1   Title  Pt will be IND in HEP in order to improve strength, balance, endurance, and flexibility. TARGET DATE FOR ALL STGS: 03/15/17    Status  Partially Met      PT SHORT TERM GOAL #2   Title  Pt will improve FGA score to >/=21/30 to decr. falls risk.     Status  Achieved      PT SHORT TERM GOAL #3   Title  Pt will amb. 300', IND, over even terrain without LOB to improve functional mobility.     Status  Achieved      PT SHORT TERM GOAL #4   Title  Perform 6MWT and write goal as indicated.     Status  Achieved        PT Long Term  Goals - 03/01/17 1153      PT LONG TERM GOAL #1   Title  Pt will amb. 1000' over even/uneven terrain, at MOD I level, without LOB to improve functional mobility. TARGET DATE FOR ALL LTGS: 04/12/17    Status  New      PT LONG TERM GOAL #2   Title  Pt will improve FGA score to >/=25/30 to decr. falls risk.     Status  New      PT LONG TERM GOAL #3   Title  Pt will improve 6MWT distance to >/=1545' to improve endurance and to be WNL for age group and gender.     Baseline  1443'    Status  Revised            Plan - 03/29/17 1242    Clinical Impression Statement  Pt's BP noted to be lower today; pt feels the increase in BP when she comes to therapy may be due to breathing treatment.  Pt chose to hold breathing treatment today to assess effect on BP during therapy.  Treatment session focused on progression of standing balance HEP: adding head turns to eyes closed, diagonal head turns to eyes open, more compliant BOS with tandem gait and wall bumps for training of hip strategy on variety of surfaces, EO and EC.  Pt tolerated well with no increase in back pain.  Will continue to progress towards LTG.    Rehab Potential  Good    PT Frequency  2x / week    PT Duration  8 weeks    PT Treatment/Interventions  ADLs/Self Care Home Management;Biofeedback;Canalith Repostioning;Therapeutic exercise;Therapeutic activities;Functional mobility training;Manual techniques;Vestibular;Orthotic Fit/Training;Stair training;Gait training;Patient/family education;DME Instruction;Neuromuscular re-education;Balance training    PT Next Visit Plan  Check BP. High level balance and gait activities. coordination activities    Consulted and Agree with Plan of Care  Patient       Patient will benefit from skilled therapeutic intervention in order to improve the following deficits and impairments:  Abnormal gait, Decreased endurance, Impaired sensation, Decreased knowledge of use of DME, Decreased strength, Decreased  balance, Decreased mobility, Impaired flexibility, Postural dysfunction, Decreased coordination  Visit Diagnosis: Other abnormalities of gait and mobility  Muscle weakness (generalized)  Unsteadiness  on feet  Other disturbances of skin sensation     Problem List Patient Active Problem List   Diagnosis Date Noted  . COPD mixed type ([place]) 03/02/2015  . Dyspnea 12/23/2014  . Ex-cigarette smoker 12/23/2014  . Seroma complicating a procedure 09/24/2013  . Mass of arm 08/26/2013  . Atypical facial pain 07/13/2013  . Melanoma of left upper arm ([place]) 03/23/2013    Rico Junker, PT, DPT 03/29/17    12:50 PM    [facility] [address] [place], [place], [address] Phone: [phone]   Fax:  [phone]  Name: Brittany Oliver MRN: [id number] Date of Birth: [birth year]

## 2017-04-01 ENCOUNTER — Encounter: Payer: Self-pay | Admitting: Physical Therapy

## 2017-04-01 ENCOUNTER — Ambulatory Visit: Payer: Medicare Other | Admitting: Physical Therapy

## 2017-04-01 DIAGNOSIS — M6281 Muscle weakness (generalized): Secondary | ICD-10-CM | POA: Diagnosis not present

## 2017-04-01 DIAGNOSIS — R2681 Unsteadiness on feet: Secondary | ICD-10-CM

## 2017-04-01 DIAGNOSIS — R2689 Other abnormalities of gait and mobility: Secondary | ICD-10-CM | POA: Diagnosis not present

## 2017-04-01 DIAGNOSIS — R208 Other disturbances of skin sensation: Secondary | ICD-10-CM | POA: Diagnosis not present

## 2017-04-01 NOTE — Therapy (Signed)
[facility] [address] [place], [place], [address] Phone: [phone]   Fax:  [phone]  Physical Therapy Treatment  Patient Details  Name: Brittany Oliver MRN: [id number] Date of Birth: May 11, 1940 Referring Provider: Dr. Tomi Likens   Encounter Date: 04/01/2017  PT End of Session - 04/01/17 1055    Visit Number  9    Number of Visits  17    Date for PT Re-Evaluation  04/16/17    Authorization Type  Medicare and BCBS    PT Start Time  1016    PT Stop Time  1055    PT Time Calculation (min)  39 min    Equipment Utilized During Treatment  Gait belt    Activity Tolerance  Patient tolerated treatment well    Behavior During Therapy  WFL for tasks assessed/performed       Past Medical History:  Diagnosis Date  . Arthritis   . Cancer ([place]) 02/2013   skin melanoma  . Heart murmur   . Hyperlipidemia   . Hypertension   . Neuralgia facialis vera   . Stroke [facility]) 1987   has a little balance issue since stroke    Past Surgical History:  Procedure Laterality Date  . ABDOMINAL HYSTERECTOMY  1974   pt still has her uterus, took only 1 ovary and 1 tube  . APPENDECTOMY    . COLONOSCOPY    . DILATION AND CURETTAGE OF UTERUS  1965  . EXCISION MELANOMA WITH SENTINEL LYMPH NODE BIOPSY Left 03/27/2013   Procedure: EXCISION MELANOMA LEFT POSTERIOR ARM WITH LEFT AXILLARY SENTINEL LYMPH NODE BIOPSY;  Surgeon: Zenovia Jarred, MD;  Location: [facility];  Service: General;  Laterality: Left;  . EYE SURGERY Bilateral 2006   cataract and laser surgery  . FINGER SURGERY Right    middle finger  . MASS EXCISION Left 09/15/2013   Procedure: EXCISION MASS LEFT UPPER ARM;  Surgeon: Zenovia Jarred, MD;  Location: [facility];  Service: General;  Laterality: Left;  . TONSILLECTOMY      There were no vitals filed for this visit.  Subjective Assessment - 04/01/17 1018    Subjective  only has one complaint: walked Thursday and then had PT on  Friday and developed Rt anterior ankle pain; "felt like shin splints but lower."  reports it is mostly resolved now.    Patient Stated Goals  To just feel more secure when I'm walking and to get stronger.     Currently in Pain?  No/denies                           Balance Exercises - 04/01/17 1030      Balance Exercises: Standing   Standing Eyes Opened  Narrow base of support (BOS);Head turns;Foam/compliant surface    Standing Eyes Closed  Foam/compliant surface;Wide (BOA);Narrow base of support (BOS);Head turns    Rockerboard  Anterior/posterior;Lateral;EO;Head turns;10 reps;Intermittent UE support    Tandem Gait  Forward;Upper extremity support;4 reps;Foam/compliant surface    Step Over Hurdles / Cones  min A 6" and 8" hurdles with cues for reciprocal stepping; single tap/double tap to 8" cones          PT Short Term Goals - 03/14/17 1219      PT SHORT TERM GOAL #1   Title  Pt will be IND in HEP in order to improve strength, balance, endurance, and flexibility. TARGET DATE FOR ALL STGS: 03/15/17  Status  Partially Met      PT SHORT TERM GOAL #2   Title  Pt will improve FGA score to >/=21/30 to decr. falls risk.     Status  Achieved      PT SHORT TERM GOAL #3   Title  Pt will amb. 300', IND, over even terrain without LOB to improve functional mobility.     Status  Achieved      PT SHORT TERM GOAL #4   Title  Perform 6MWT and write goal as indicated.     Status  Achieved        PT Long Term Goals - 03/01/17 1153      PT LONG TERM GOAL #1   Title  Pt will amb. 1000' over even/uneven terrain, at MOD I level, without LOB to improve functional mobility. TARGET DATE FOR ALL LTGS: 04/12/17    Status  New      PT LONG TERM GOAL #2   Title  Pt will improve FGA score to >/=25/30 to decr. falls risk.     Status  New      PT LONG TERM GOAL #3   Title  Pt will improve 6MWT distance to >/=1545' to improve endurance and to be WNL for age group and gender.      Baseline  1443'    Status  Revised            Plan - 04/01/17 1056    Clinical Impression Statement  Pt tolerated balance exercises well today; has difficulty with SLS activities.  Will continue to benefit from PT to maximize function.  Progressing well towards goals.    PT Treatment/Interventions  ADLs/Self Care Home Management;Biofeedback;Canalith Repostioning;Therapeutic exercise;Therapeutic activities;Functional mobility training;Manual techniques;Vestibular;Orthotic Fit/Training;Stair training;Gait training;Patient/family education;DME Instruction;Neuromuscular re-education;Balance training    PT Next Visit Plan  Check BP. High level balance and gait activities. coordination activities; SLS activites    Consulted and Agree with Plan of Care  Patient       Patient will benefit from skilled therapeutic intervention in order to improve the following deficits and impairments:  Abnormal gait, Decreased endurance, Impaired sensation, Decreased knowledge of use of DME, Decreased strength, Decreased balance, Decreased mobility, Impaired flexibility, Postural dysfunction, Decreased coordination  Visit Diagnosis: Other abnormalities of gait and mobility  Muscle weakness (generalized)  Unsteadiness on feet  Other disturbances of skin sensation     Problem List Patient Active Problem List   Diagnosis Date Noted  . COPD mixed type ([place]) 03/02/2015  . Dyspnea 12/23/2014  . Ex-cigarette smoker 12/23/2014  . Seroma complicating a procedure 09/24/2013  . Mass of arm 08/26/2013  . Atypical facial pain 07/13/2013  . Melanoma of left upper arm ([place]) 03/23/2013      Laureen Abrahams, PT, DPT 04/01/17 10:57 AM     [facility] [address] [place], [place], [address] Phone: [phone]   Fax:  [phone]  Name: TIMMIE CALIX MRN: [id number] Date of Birth: 02-19-1941

## 2017-04-03 ENCOUNTER — Ambulatory Visit: Payer: Medicare Other | Admitting: Physical Therapy

## 2017-04-03 ENCOUNTER — Encounter: Payer: Self-pay | Admitting: Physical Therapy

## 2017-04-03 DIAGNOSIS — M6281 Muscle weakness (generalized): Secondary | ICD-10-CM | POA: Diagnosis not present

## 2017-04-03 DIAGNOSIS — R2681 Unsteadiness on feet: Secondary | ICD-10-CM | POA: Diagnosis not present

## 2017-04-03 DIAGNOSIS — R208 Other disturbances of skin sensation: Secondary | ICD-10-CM

## 2017-04-03 DIAGNOSIS — R2689 Other abnormalities of gait and mobility: Secondary | ICD-10-CM | POA: Diagnosis not present

## 2017-04-03 NOTE — Therapy (Signed)
[facility] [address] [place], [place], [address] Phone: [phone]   Fax:  [phone]  Physical Therapy Treatment  Patient Details  Name: Brittany Oliver MRN: [id number] Date of Birth: 06-04-1940 Referring Provider: Dr. Tomi Likens   Encounter Date: 04/03/2017  PT End of Session - 04/03/17 1053    Visit Number  10    Number of Visits  17    Date for PT Re-Evaluation  04/16/17    Authorization Type  Medicare and BCBS    PT Start Time  1016    PT Stop Time  1054    PT Time Calculation (min)  38 min    Equipment Utilized During Treatment  Gait belt    Activity Tolerance  Patient tolerated treatment well    Behavior During Therapy  WFL for tasks assessed/performed       Past Medical History:  Diagnosis Date  . Arthritis   . Cancer ([place]) 02/2013   skin melanoma  . Heart murmur   . Hyperlipidemia   . Hypertension   . Neuralgia facialis vera   . Stroke [facility]) 1987   has a little balance issue since stroke    Past Surgical History:  Procedure Laterality Date  . ABDOMINAL HYSTERECTOMY  1974   pt still has her uterus, took only 1 ovary and 1 tube  . APPENDECTOMY    . COLONOSCOPY    . DILATION AND CURETTAGE OF UTERUS  1965  . EXCISION MELANOMA WITH SENTINEL LYMPH NODE BIOPSY Left 03/27/2013   Procedure: EXCISION MELANOMA LEFT POSTERIOR ARM WITH LEFT AXILLARY SENTINEL LYMPH NODE BIOPSY;  Surgeon: Zenovia Jarred, MD;  Location: [place] [facility];  Service: General;  Laterality: Left;  . EYE SURGERY Bilateral 2006   cataract and laser surgery  . FINGER SURGERY Right    middle finger  . MASS EXCISION Left 09/15/2013   Procedure: EXCISION MASS LEFT UPPER ARM;  Surgeon: Zenovia Jarred, MD;  Location: [facility];  Service: General;  Laterality: Left;  . TONSILLECTOMY      There were no vitals filed for this visit.  Subjective Assessment - 04/03/17 1017    Subjective  no more Rt ankle pain today; seems to be resolved.     Patient Stated Goals  To just feel more secure when I'm walking and to get stronger.     Currently in Pain?  No/denies                      [facility] Adult PT Treatment/Exercise - 04/03/17 1031      Knee/Hip Exercises: Aerobic   Nustep  SciFit L3.0 x 5 min      Knee/Hip Exercises: Standing   Lateral Step Up  Both;10 reps;Step Height: 6";Hand Hold: 0 from 2" foam to 8" step    Forward Step Up  Both;10 reps;Step Height: 6";Hand Hold: 0 from 2" foam to 8" step          Balance Exercises - 04/03/17 1035      Balance Exercises: Standing   Gait with Head Turns  Forward;4 reps horizontal/vertical/diagonals; minguard A    Tandem Gait  Forward;Upper extremity support;4 reps;Foam/compliant surface    Other Standing Exercises  marching with/without knee extension; amb backwards and with EC with min guard A and cues for pace          PT Short Term Goals - 03/14/17 1219      PT SHORT TERM GOAL #1  Title  Pt will be IND in HEP in order to improve strength, balance, endurance, and flexibility. TARGET DATE FOR ALL STGS: 03/15/17    Status  Partially Met      PT SHORT TERM GOAL #2   Title  Pt will improve FGA score to >/=21/30 to decr. falls risk.     Status  Achieved      PT SHORT TERM GOAL #3   Title  Pt will amb. 300', IND, over even terrain without LOB to improve functional mobility.     Status  Achieved      PT SHORT TERM GOAL #4   Title  Perform 6MWT and write goal as indicated.     Status  Achieved        PT Long Term Goals - 03/01/17 1153      PT LONG TERM GOAL #1   Title  Pt will amb. 1000' over even/uneven terrain, at MOD I level, without LOB to improve functional mobility. TARGET DATE FOR ALL LTGS: 04/12/17    Status  New      PT LONG TERM GOAL #2   Title  Pt will improve FGA score to >/=25/30 to decr. falls risk.     Status  New      PT LONG TERM GOAL #3   Title  Pt will improve 6MWT distance to >/=1545' to improve endurance and to be WNL for age  group and gender.     Baseline  1443'    Status  Revised            Plan - 04/03/17 1054    Clinical Impression Statement  Pt tolerated session well today and anticipate pt will be ready for d/c next week.  Progressing well with PT.    PT Treatment/Interventions  ADLs/Self Care Home Management;Biofeedback;Canalith Repostioning;Therapeutic exercise;Therapeutic activities;Functional mobility training;Manual techniques;Vestibular;Orthotic Fit/Training;Stair training;Gait training;Patient/family education;DME Instruction;Neuromuscular re-education;Balance training    PT Next Visit Plan  Check BP. High level balance and gait activities. coordination activities; SLS activites; prepare for d/c    Consulted and Agree with Plan of Care  Patient       Patient will benefit from skilled therapeutic intervention in order to improve the following deficits and impairments:  Abnormal gait, Decreased endurance, Impaired sensation, Decreased knowledge of use of DME, Decreased strength, Decreased balance, Decreased mobility, Impaired flexibility, Postural dysfunction, Decreased coordination  Visit Diagnosis: Other abnormalities of gait and mobility  Muscle weakness (generalized)  Unsteadiness on feet  Other disturbances of skin sensation     Problem List Patient Active Problem List   Diagnosis Date Noted  . COPD mixed type ([place]) 03/02/2015  . Dyspnea 12/23/2014  . Ex-cigarette smoker 12/23/2014  . Seroma complicating a procedure 09/24/2013  . Mass of arm 08/26/2013  . Atypical facial pain 07/13/2013  . Melanoma of left upper arm ([place]) 03/23/2013      Laureen Abrahams, PT, DPT 04/03/17 10:57 AM    [facility] [address] [place], [place], [address] Phone: [phone]   Fax:  [phone]  Name: Brittany Oliver MRN: [id number] Date of Birth: 05/30/1940

## 2017-04-08 ENCOUNTER — Encounter: Payer: Self-pay | Admitting: Physical Therapy

## 2017-04-08 ENCOUNTER — Ambulatory Visit: Payer: Medicare Other | Admitting: Physical Therapy

## 2017-04-08 DIAGNOSIS — R208 Other disturbances of skin sensation: Secondary | ICD-10-CM

## 2017-04-08 DIAGNOSIS — M6281 Muscle weakness (generalized): Secondary | ICD-10-CM | POA: Diagnosis not present

## 2017-04-08 DIAGNOSIS — R2681 Unsteadiness on feet: Secondary | ICD-10-CM | POA: Diagnosis not present

## 2017-04-08 DIAGNOSIS — R2689 Other abnormalities of gait and mobility: Secondary | ICD-10-CM

## 2017-04-08 NOTE — Therapy (Signed)
[facility] [address] [place], [place], [address] Phone: [phone]   Fax:  [phone]  Physical Therapy Treatment/Discharge  Patient Details  Name: Brittany Oliver MRN: [id number] Date of Birth: Jun 21, 1940 Referring Provider: Dr. Tomi Likens   Encounter Date: 04/08/2017  PT End of Session - 04/08/17 1048    Visit Number  11    Authorization Type  Medicare and BCBS    PT Start Time  5638    PT Stop Time  1046    PT Time Calculation (min)  31 min    Equipment Utilized During Treatment  Gait belt    Activity Tolerance  Patient tolerated treatment well    Behavior During Therapy  WFL for tasks assessed/performed       Past Medical History:  Diagnosis Date  . Arthritis   . Cancer ([place]) 02/2013   skin melanoma  . Heart murmur   . Hyperlipidemia   . Hypertension   . Neuralgia facialis vera   . Stroke [facility]) 1987   has a little balance issue since stroke    Past Surgical History:  Procedure Laterality Date  . ABDOMINAL HYSTERECTOMY  1974   pt still has her uterus, took only 1 ovary and 1 tube  . APPENDECTOMY    . COLONOSCOPY    . DILATION AND CURETTAGE OF UTERUS  1965  . EXCISION MELANOMA WITH SENTINEL LYMPH NODE BIOPSY Left 03/27/2013   Procedure: EXCISION MELANOMA LEFT POSTERIOR ARM WITH LEFT AXILLARY SENTINEL LYMPH NODE BIOPSY;  Surgeon: Zenovia Jarred, MD;  Location: [facility];  Service: General;  Laterality: Left;  . EYE SURGERY Bilateral 2006   cataract and laser surgery  . FINGER SURGERY Right    middle finger  . MASS EXCISION Left 09/15/2013   Procedure: EXCISION MASS LEFT UPPER ARM;  Surgeon: Zenovia Jarred, MD;  Location: [facility];  Service: General;  Laterality: Left;  . TONSILLECTOMY      There were no vitals filed for this visit.  Subjective Assessment - 04/08/17 1017    Subjective  dong well; no pain or complaints    Patient Stated Goals  To just feel more secure when I'm walking and to get  stronger.     Currently in Pain?  No/denies         [facility] PT Assessment - 04/08/17 1020      Ambulation/Gait   Ambulation/Gait  Yes    Ambulation/Gait Assistance  7: Independent    Ambulation Distance (Feet)  1350 Feet plus FGA    Assistive device  None    Gait Pattern  Within Functional Limits    Ambulation Surface  Level;Unlevel;Indoor    Stairs  Yes    Stairs Assistance  6: Modified independent (Device/Increase time)    Stair Management Technique  One rail Left;Forwards;Alternating pattern    Number of Stairs  12    Height of Stairs  6    Ramp  7: Independent    Curb  7: Independent    Gait Comments  simulated outdoor surfaces due to weather      6 Minute Walk- Baseline   6 Minute Walk- Baseline  yes    BP (mmHg)  145/72    HR (bpm)  73    02 Sat (%RA)  96 %    Modified Borg Scale for Dyspnea  0- Nothing at all    Perceived Rate of Exertion (Borg)  6-      6 Minute walk-  Post Test   6 Minute Walk Post Test  yes    BP (mmHg)  179/103  (Abnormal)     HR (bpm)  97    02 Sat (%RA)  96 %    Modified Borg Scale for Dyspnea  1- Very mild shortness of breath    Perceived Rate of Exertion (Borg)  11- Fairly light      6 minute walk test results    Aerobic Endurance Distance Walked  1349    Endurance additional comments  BP after 5 min rest break 153/78      Functional Gait  Assessment   Gait Level Surface  Walks 20 ft in less than 5.5 sec, no assistive devices, good speed, no evidence for imbalance, normal gait pattern, deviates no more than 6 in outside of the 12 in walkway width.    Change in Gait Speed  Able to smoothly change walking speed without loss of balance or gait deviation. Deviate no more than 6 in outside of the 12 in walkway width.    Gait with Horizontal Head Turns  Performs head turns smoothly with no change in gait. Deviates no more than 6 in outside 12 in walkway width    Gait with Vertical Head Turns  Performs head turns with no change in gait. Deviates  no more than 6 in outside 12 in walkway width.    Gait and Pivot Turn  Pivot turns safely within 3 sec and stops quickly with no loss of balance.    Step Over Obstacle  Is able to step over 2 stacked shoe boxes taped together (9 in total height) without changing gait speed. No evidence of imbalance.    Gait with Narrow Base of Support  Ambulates less than 4 steps heel to toe or cannot perform without assistance.    Gait with Eyes Closed  Walks 20 ft, no assistive devices, good speed, no evidence of imbalance, normal gait pattern, deviates no more than 6 in outside 12 in walkway width. Ambulates 20 ft in less than 7 sec.    Ambulating Backwards  Walks 20 ft, uses assistive device, slower speed, mild gait deviations, deviates 6-10 in outside 12 in walkway width.    Steps  Alternating feet, must use rail.    Total Score  25                            PT Short Term Goals - 03/14/17 1219      PT SHORT TERM GOAL #1   Title  Pt will be IND in HEP in order to improve strength, balance, endurance, and flexibility. TARGET DATE FOR ALL STGS: 03/15/17    Status  Partially Met      PT SHORT TERM GOAL #2   Title  Pt will improve FGA score to >/=21/30 to decr. falls risk.     Status  Achieved      PT SHORT TERM GOAL #3   Title  Pt will amb. 300', IND, over even terrain without LOB to improve functional mobility.     Status  Achieved      PT SHORT TERM GOAL #4   Title  Perform 6MWT and write goal as indicated.     Status  Achieved        PT Long Term Goals - 04/08/17 1048      PT LONG TERM GOAL #1   Title  Pt will amb. 1000' over  even/uneven terrain, at MOD I level, without LOB to improve functional mobility. TARGET DATE FOR ALL LTGS: 04/12/17    Status  Achieved      PT LONG TERM GOAL #2   Title  Pt will improve FGA score to >/=25/30 to decr. falls risk.     Status  Achieved      PT LONG TERM GOAL #3   Title  Pt will improve 6MWT distance to >/=1545' to improve  endurance and to be WNL for age group and gender.     Baseline  04/08/17: 7998' (decreased from initial test)    Status  Not Met            Plan - 04/08/17 1049    Clinical Impression Statement  Pt has met 2/3 LTGs and is ready for d/c.  6MWT goal not met and actually decreased from initial eval but pt amb at good pace throughout and maintained safe ambulation.    PT Treatment/Interventions  ADLs/Self Care Home Management;Biofeedback;Canalith Repostioning;Therapeutic exercise;Therapeutic activities;Functional mobility training;Manual techniques;Vestibular;Orthotic Fit/Training;Stair training;Gait training;Patient/family education;DME Instruction;Neuromuscular re-education;Balance training    PT Next Visit Plan  d/c PT today    Consulted and Agree with Plan of Care  Patient       Patient will benefit from skilled therapeutic intervention in order to improve the following deficits and impairments:  Abnormal gait, Decreased endurance, Impaired sensation, Decreased knowledge of use of DME, Decreased strength, Decreased balance, Decreased mobility, Impaired flexibility, Postural dysfunction, Decreased coordination  Visit Diagnosis: Other abnormalities of gait and mobility  Muscle weakness (generalized)  Unsteadiness on feet  Other disturbances of skin sensation     Problem List Patient Active Problem List   Diagnosis Date Noted  . COPD mixed type ([place]) 03/02/2015  . Dyspnea 12/23/2014  . Ex-cigarette smoker 12/23/2014  . Seroma complicating a procedure 09/24/2013  . Mass of arm 08/26/2013  . Atypical facial pain 07/13/2013  . Melanoma of left upper arm ([place]) 03/23/2013      Laureen Abrahams, PT, DPT 04/08/17 10:51 AM    [facility] [address] [place], [place], [address] Phone: [phone]   Fax:  [phone]  Name: Brittany Oliver MRN: [id number] Date of Birth: Jan 07, 1941      PHYSICAL  THERAPY DISCHARGE SUMMARY  Visits from Start of Care: 11  Current functional level related to goals / functional outcomes: See above   Remaining deficits: See above   Education / Equipment: HEP  Plan: Patient agrees to discharge.  Patient goals were partially met. Patient is being discharged due to meeting the stated rehab goals.  ?????      Laureen Abrahams, PT, DPT 04/08/17 10:51 AM  [facility] [address] [place], [place] [address]  [phone] (office) [phone] (fax)

## 2017-04-09 LAB — PROTEIN ELECTROPHORESIS, SERUM
Albumin ELP: 4.3 g/dL (ref 3.8–4.8)
Alpha 1: 0.3 g/dL (ref 0.2–0.3)
Alpha 2: 0.7 g/dL (ref 0.5–0.9)
Beta 2: 0.2 g/dL (ref 0.2–0.5)
Beta Globulin: 0.4 g/dL (ref 0.4–0.6)
Gamma Globulin: 0.7 g/dL — ABNORMAL LOW (ref 0.8–1.7)
Total Protein: 6.6 g/dL (ref 6.1–8.1)

## 2017-04-09 LAB — VITAMIN B12: Vitamin B-12: 230 pg/mL (ref 200–1100)

## 2017-04-09 LAB — IMMUNOFIXATION ELECTROPHORESIS
IgG (Immunoglobin G), Serum: 743 mg/dL (ref 694–1618)
IgM, Serum: 79 mg/dL (ref 48–271)
Immunoglobulin A: 132 mg/dL (ref 81–463)

## 2017-04-09 LAB — SEDIMENTATION RATE: Sed Rate: 6 mm/h (ref 0–30)

## 2017-04-09 LAB — VITAMIN B6: Vitamin B6: 12 ng/mL (ref 2.1–21.7)

## 2017-04-09 LAB — ANA: Anti Nuclear Antibody(ANA): NEGATIVE

## 2017-04-09 LAB — TSH: TSH: 0.78 mIU/L (ref 0.40–4.50)

## 2017-04-10 ENCOUNTER — Ambulatory Visit: Payer: Medicare Other | Admitting: Physical Therapy

## 2017-04-19 DIAGNOSIS — Z23 Encounter for immunization: Secondary | ICD-10-CM | POA: Diagnosis not present

## 2017-04-19 DIAGNOSIS — I10 Essential (primary) hypertension: Secondary | ICD-10-CM | POA: Diagnosis not present

## 2017-04-19 DIAGNOSIS — H6121 Impacted cerumen, right ear: Secondary | ICD-10-CM | POA: Diagnosis not present

## 2017-07-12 ENCOUNTER — Encounter: Payer: Self-pay | Admitting: Neurology

## 2017-08-24 DIAGNOSIS — M545 Low back pain: Secondary | ICD-10-CM | POA: Diagnosis not present

## 2017-08-24 DIAGNOSIS — M5137 Other intervertebral disc degeneration, lumbosacral region: Secondary | ICD-10-CM | POA: Diagnosis not present

## 2017-09-05 DIAGNOSIS — I10 Essential (primary) hypertension: Secondary | ICD-10-CM | POA: Diagnosis not present

## 2017-09-05 DIAGNOSIS — G500 Trigeminal neuralgia: Secondary | ICD-10-CM | POA: Diagnosis not present

## 2017-09-05 DIAGNOSIS — R7303 Prediabetes: Secondary | ICD-10-CM | POA: Diagnosis not present

## 2017-09-05 DIAGNOSIS — E559 Vitamin D deficiency, unspecified: Secondary | ICD-10-CM | POA: Diagnosis not present

## 2017-09-05 DIAGNOSIS — J449 Chronic obstructive pulmonary disease, unspecified: Secondary | ICD-10-CM | POA: Diagnosis not present

## 2017-09-05 DIAGNOSIS — Z85820 Personal history of malignant melanoma of skin: Secondary | ICD-10-CM | POA: Diagnosis not present

## 2017-09-05 DIAGNOSIS — H9193 Unspecified hearing loss, bilateral: Secondary | ICD-10-CM | POA: Diagnosis not present

## 2017-09-05 DIAGNOSIS — E785 Hyperlipidemia, unspecified: Secondary | ICD-10-CM | POA: Diagnosis not present

## 2017-09-05 DIAGNOSIS — Z0000 Encounter for general adult medical examination without abnormal findings: Secondary | ICD-10-CM | POA: Diagnosis not present

## 2017-09-05 DIAGNOSIS — Z86010 Personal history of colonic polyps: Secondary | ICD-10-CM | POA: Diagnosis not present

## 2017-09-06 DIAGNOSIS — H04123 Dry eye syndrome of bilateral lacrimal glands: Secondary | ICD-10-CM | POA: Diagnosis not present

## 2017-09-06 DIAGNOSIS — Z961 Presence of intraocular lens: Secondary | ICD-10-CM | POA: Diagnosis not present

## 2017-09-06 DIAGNOSIS — H43811 Vitreous degeneration, right eye: Secondary | ICD-10-CM | POA: Diagnosis not present

## 2017-09-06 DIAGNOSIS — H52203 Unspecified astigmatism, bilateral: Secondary | ICD-10-CM | POA: Diagnosis not present

## 2017-09-10 DIAGNOSIS — Z23 Encounter for immunization: Secondary | ICD-10-CM | POA: Diagnosis not present

## 2017-10-08 DIAGNOSIS — H903 Sensorineural hearing loss, bilateral: Secondary | ICD-10-CM | POA: Diagnosis not present

## 2017-10-15 ENCOUNTER — Other Ambulatory Visit: Payer: Self-pay | Admitting: Sports Medicine

## 2017-10-15 ENCOUNTER — Ambulatory Visit
Admission: RE | Admit: 2017-10-15 | Discharge: 2017-10-15 | Disposition: A | Discharge status: Home / Self Care | Payer: Medicare Other | Source: Ambulatory Visit | Attending: Sports Medicine | Admitting: Sports Medicine

## 2017-10-15 DIAGNOSIS — M5137 Other intervertebral disc degeneration, lumbosacral region: Secondary | ICD-10-CM

## 2017-10-15 DIAGNOSIS — M48061 Spinal stenosis, lumbar region without neurogenic claudication: Secondary | ICD-10-CM | POA: Diagnosis not present

## 2017-10-16 DIAGNOSIS — Z1231 Encounter for screening mammogram for malignant neoplasm of breast: Secondary | ICD-10-CM | POA: Diagnosis not present

## 2017-10-24 DIAGNOSIS — M545 Low back pain: Secondary | ICD-10-CM | POA: Diagnosis not present

## 2017-10-24 DIAGNOSIS — M5137 Other intervertebral disc degeneration, lumbosacral region: Secondary | ICD-10-CM | POA: Diagnosis not present

## 2017-11-01 ENCOUNTER — Other Ambulatory Visit: Payer: Self-pay | Admitting: Sports Medicine

## 2017-11-01 DIAGNOSIS — M545 Low back pain: Principal | ICD-10-CM

## 2017-11-01 DIAGNOSIS — G8929 Other chronic pain: Secondary | ICD-10-CM

## 2017-11-15 ENCOUNTER — Other Ambulatory Visit: Payer: Self-pay | Admitting: Sports Medicine

## 2017-11-15 ENCOUNTER — Ambulatory Visit
Admission: RE | Admit: 2017-11-15 | Discharge: 2017-11-15 | Disposition: A | Discharge status: Home / Self Care | Payer: Medicare Other | Source: Ambulatory Visit | Attending: Sports Medicine | Admitting: Sports Medicine

## 2017-11-15 DIAGNOSIS — M79604 Pain in right leg: Secondary | ICD-10-CM | POA: Diagnosis not present

## 2017-11-15 DIAGNOSIS — M545 Low back pain: Principal | ICD-10-CM

## 2017-11-15 DIAGNOSIS — G8929 Other chronic pain: Secondary | ICD-10-CM

## 2017-11-15 MED ORDER — IOPAMIDOL (ISOVUE-M 200) INJECTION 41%: 1.0000 mL | Freq: Once | INTRAMUSCULAR | Status: DC

## 2017-11-15 MED ORDER — METHYLPREDNISOLONE ACETATE 40 MG/ML INJ SUSP (RADIOLOG: 120.0000 mg | Freq: Once | INTRAMUSCULAR | Status: DC

## 2017-11-15 NOTE — Discharge Instructions (Signed)

## 2018-01-15 DIAGNOSIS — L218 Other seborrheic dermatitis: Secondary | ICD-10-CM | POA: Diagnosis not present

## 2018-01-15 DIAGNOSIS — R208 Other disturbances of skin sensation: Secondary | ICD-10-CM | POA: Diagnosis not present

## 2018-01-15 DIAGNOSIS — L570 Actinic keratosis: Secondary | ICD-10-CM | POA: Diagnosis not present

## 2018-01-15 DIAGNOSIS — Z85820 Personal history of malignant melanoma of skin: Secondary | ICD-10-CM | POA: Diagnosis not present

## 2018-01-15 DIAGNOSIS — D485 Neoplasm of uncertain behavior of skin: Secondary | ICD-10-CM | POA: Diagnosis not present

## 2018-01-15 DIAGNOSIS — L821 Other seborrheic keratosis: Secondary | ICD-10-CM | POA: Diagnosis not present

## 2018-01-15 DIAGNOSIS — C44519 Basal cell carcinoma of skin of other part of trunk: Secondary | ICD-10-CM | POA: Diagnosis not present

## 2018-01-15 DIAGNOSIS — D1801 Hemangioma of skin and subcutaneous tissue: Secondary | ICD-10-CM | POA: Diagnosis not present

## 2018-01-16 NOTE — Progress Notes (Signed)
NEUROLOGY FOLLOW UP OFFICE NOTE  MARCHE HOTTENSTEIN [id number]  HISTORY OF PRESENT ILLNESS: Brittany Oliver is a 77 year old right-handed woman with hypertension, hypercholesterolemia, and history of posterior circulation stroke who follows up for atypical left-sided facial pain.  UPDATE:  Current medication: Gabapentin 900 mg 3 times daily. Facial pain is controlled.  Last year, she endorsed balance problems and exhibited some symptoms of neuropathy in the feet.  She underwent work-up for neuropathy.  ANA, sed rate, B12, B6, TSH, SPEP and IFE were unremarkable.  She was referred to physical therapy to help with balance.  It was minimally effective but may have not been as proactive in home exercises.  She has to think before turning around too fast.  No associated dizziness.  HISTORY:  She began experiencing left-sided facial pain and 2006. She reports a fairly constant burning and pins and needles sensation in the left V1 and V2 distribution. Light touch, cold and wind can trigger a shooting pain down the face, but there is no spontaneous paroxysmal shooting pain. Eating and brushing her teeth does not aggravate it. She says heat seems to help relieve it. She was eventually diagnosed with left trigeminal neuralgia in 2012.  Past medications: Trileptal (caused hyponatremia) Other therapy: Gamma Knife x2 (ineffective).  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  She does have a history of brainstem stroke in 1987, in which she presented with left oculomotor weakness, left rotatory nystagmus, left facial numbness and dysfunction of her right arm and leg. She still has residual numbness of the left face.  PAST MEDICAL HISTORY: Past Medical History:  Diagnosis Date  . Arthritis   . Cancer ([place]) 02/2013   skin melanoma  . Heart murmur   . Hyperlipidemia   . Hypertension   . Neuralgia facialis vera   . Stroke [facility]) 1987   has a little balance issue since stroke    MEDICATIONS: Current Outpatient Medications on File Prior to Visit  Medication Sig Dispense Refill  . Calcium Citrate-Vitamin D (CALCIUM + D PO) Take 1 capsule by mouth daily.    Marland Kitchen FLECTOR 1.3 % PTCH     . Fluticasone-Umeclidin-Vilant (TRELEGY ELLIPTA) 100-62.5-25 MCG/INH AEPB Inhale into the lungs.    . gabapentin (NEURONTIN) 300 MG capsule TAKE 3 CAPSULES (=900MG ) 3 TIMES A DAY (TOTAL OF      2700MG  DAILY) 810 capsule 3  . losartan-hydrochlorothiazide (HYZAAR) 100-12.5 MG tablet     . pravastatin (PRAVACHOL) 80 MG tablet Take 80 mg by mouth daily.    . SYMBICORT 160-4.5 MCG/ACT inhaler Inhale 2 puffs into the lungs 2 (two) times daily. (Patient not taking: Reported on 02/15/2017) 3 Inhaler 3  . tiotropium (SPIRIVA HANDIHALER) 18 MCG inhalation capsule Place 1 capsule (18 mcg total) into inhaler and inhale daily. (Patient not taking: Reported on 02/15/2017) 90 capsule 3  . vitamin C (ASCORBIC ACID) 500 MG tablet Take 500 mg by mouth daily.     No current facility-administered medications on file prior to visit.     ALLERGIES: No Known Allergies  FAMILY HISTORY: Family History  Adopted: Yes  Problem Relation Age of Onset  . Ataxia Neg Hx   . Chorea Neg Hx   . Dementia Neg Hx   . Mental retardation Neg Hx   . Migraines Neg Hx   . Multiple sclerosis Neg Hx   .  Neurofibromatosis Neg Hx   . Neuropathy Neg Hx   . Parkinsonism Neg Hx   . Seizures Neg Hx   . Stroke Neg Hx     SOCIAL HISTORY: Social History   Socioeconomic History  . Marital status: Married    Spouse name: Not on file  . Number of children: Not on file  . Years of education: Not on file  . Highest education level: Not on file  Occupational History  . Not on file  Social Needs  . Financial resource strain: Not on file  . Food insecurity:     Worry: Not on file    Inability: Not on file  . Transportation needs:    Medical: Not on file    Non-medical: Not on file  Tobacco Use  . Smoking status: Former Smoker    Packs/day: 0.50    Years: 43.20    Pack years: 21.60    Types: Cigarettes    Last attempt to quit: 03/23/2005    Years since quitting: 12.8  . Smokeless tobacco: Never Used  . Tobacco comment: QUIT 9 YEARS AGO  Substance and Sexual Activity  . Alcohol use: Yes    Alcohol/week: 5.0 standard drinks    Types: 5 Glasses of wine per week  . Drug use: No  . Sexual activity: Yes    Partners: Male  Lifestyle  . Physical activity:    Days per week: Not on file    Minutes per session: Not on file  . Stress: Not on file  Relationships  . Social connections:    Talks on phone: Not on file    Gets together: Not on file    Attends religious service: Not on file    Active member of club or organization: Not on file    Attends meetings of clubs or organizations: Not on file    Relationship status: Not on file  . Intimate partner violence:    Fear of current or ex partner: Not on file    Emotionally abused: Not on file    Physically abused: Not on file    Forced sexual activity: Not on file  Other Topics Concern  . Not on file  Social History Narrative  . Not on file    REVIEW OF SYSTEMS: Constitutional: No fevers, chills, or sweats, no generalized fatigue, change in appetite Eyes: No visual changes, double vision, eye pain Ear, nose and throat: No hearing loss, ear pain, nasal congestion, sore throat Cardiovascular: No chest pain, palpitations Respiratory:  No shortness of breath at rest or with exertion, wheezes GastrointestinaI: No nausea, vomiting, diarrhea, abdominal pain, fecal incontinence Genitourinary:  No dysuria, urinary retention or frequency Musculoskeletal:  No neck pain, back pain Integumentary: No rash, pruritus, skin lesions Neurological: as above Psychiatric: No depression, insomnia,  anxiety Endocrine: No palpitations, fatigue, diaphoresis, mood swings, change in appetite, change in weight, increased thirst Hematologic/Lymphatic:  No purpura, petechiae. Allergic/Immunologic: no itchy/runny eyes, nasal congestion, recent allergic reactions, rashes  PHYSICAL EXAM: Blood pressure 128/84, pulse 84, height 5\' 3"  (1.6 m), weight 129 lb (58.5 kg), SpO2 95 %. General: No acute distress.  Patient appears well-groomed.   Head:  Normocephalic/atraumatic Eyes:  Fundi examined but not visualized Neck: supple, no paraspinal tenderness, full range of motion Heart:  Regular rate and rhythm Lungs:  Clear to auscultation bilaterally Back: No paraspinal tenderness Neurological Exam: alert and oriented to person, place, and time. Attention span and concentration intact, recent and remote memory intact, fund of  knowledge intact.  Speech fluent and not dysarthric, language intact.  CN II-XII intact. Bulk and tone normal, muscle strength 5/5 throughout.  Sensation to pinprick reduced in the feet.  Vibratory sensation seems overall intact.  Deep tendon reflexes 1+ throughout, toes downgoing.  Finger to nose and heel to shin testing intact.  Gait Mildly wide-based, Romberg with sway  IMPRESSION: 1.  Atypical left-sided facial pain, stable 2.  Unsteady feet.  Neurologic exam is not convincing for intracranial abnormality or myelopathy.  She does not exhibit symptoms of Parkinson's disease.  Reduced sensation appreciated in the feet, suggesting possible underlying neuropathy.  At this point, we will defer further testing such as NCV-EMG as it would not change management (we already checked labs for common causes of neuropathy).  PLAN: 1.  She will continue gabapentin 900 mg 3 times daily.  She requests a prescription for 600 mg tablets as she would like to take 1 300 mg tablet with 600 mg tablet 3 times daily to minimize the number of pills. 2.  Follow up in one year.  25 minutes spent with the  patient, over 50% spent discussing management.  Metta Clines, DO  CC: [name], MD

## 2018-01-17 ENCOUNTER — Ambulatory Visit (INDEPENDENT_AMBULATORY_CARE_PROVIDER_SITE_OTHER): Payer: Medicare Other | Admitting: Neurology

## 2018-01-17 ENCOUNTER — Encounter: Payer: Self-pay | Admitting: Neurology

## 2018-01-17 VITALS — BP 128/84 | HR 84 | Ht 63.0 in | Wt 129.0 lb

## 2018-01-17 DIAGNOSIS — R2681 Unsteadiness on feet: Secondary | ICD-10-CM

## 2018-01-17 DIAGNOSIS — G501 Atypical facial pain: Secondary | ICD-10-CM | POA: Diagnosis not present

## 2018-01-17 MED ORDER — GABAPENTIN 600 MG PO TABS: ORAL_TABLET | ORAL | 3 refills | Status: DC

## 2018-01-17 NOTE — Patient Instructions (Signed)
Refilled gabapentin 600mg  to [facility].  Take 1 tablet with 1 300mg  tablet three times daily Follow up in one year

## 2018-02-28 DIAGNOSIS — C44519 Basal cell carcinoma of skin of other part of trunk: Secondary | ICD-10-CM | POA: Diagnosis not present

## 2018-03-14 DIAGNOSIS — J40 Bronchitis, not specified as acute or chronic: Secondary | ICD-10-CM | POA: Diagnosis not present

## 2018-03-24 DIAGNOSIS — M79605 Pain in left leg: Secondary | ICD-10-CM | POA: Diagnosis not present

## 2018-03-27 ENCOUNTER — Telehealth: Payer: Self-pay | Admitting: Neurology

## 2018-03-27 NOTE — Telephone Encounter (Signed)
Patient is calling in about last time she was in. She said both sizes of Gabapentin wasn't called in. She does not have the 300mg . Please send to the [facility]. Thanks!

## 2018-03-31 ENCOUNTER — Other Ambulatory Visit: Payer: Self-pay

## 2018-03-31 MED ORDER — GABAPENTIN 300 MG PO CAPS: 300.0000 mg | ORAL_CAPSULE | Freq: Every day | ORAL | 3 refills | Status: DC

## 2018-03-31 NOTE — Telephone Encounter (Signed)
Rx was sent in at same time as the 600 mg, however, the SIG was incorrecect  Resending to [facility] now.

## 2018-04-14 DIAGNOSIS — E785 Hyperlipidemia, unspecified: Secondary | ICD-10-CM | POA: Diagnosis not present

## 2018-04-14 DIAGNOSIS — R7303 Prediabetes: Secondary | ICD-10-CM | POA: Diagnosis not present

## 2018-04-14 DIAGNOSIS — I10 Essential (primary) hypertension: Secondary | ICD-10-CM | POA: Diagnosis not present

## 2018-04-14 DIAGNOSIS — Z86010 Personal history of colonic polyps: Secondary | ICD-10-CM | POA: Diagnosis not present

## 2018-04-14 DIAGNOSIS — E559 Vitamin D deficiency, unspecified: Secondary | ICD-10-CM | POA: Diagnosis not present

## 2018-04-14 DIAGNOSIS — J449 Chronic obstructive pulmonary disease, unspecified: Secondary | ICD-10-CM | POA: Diagnosis not present

## 2018-04-16 ENCOUNTER — Telehealth: Payer: Self-pay | Admitting: Neurology

## 2018-04-16 NOTE — Telephone Encounter (Signed)
Called and LMOVM for Pt to return my call 

## 2018-04-16 NOTE — Telephone Encounter (Signed)
Patient wants to speak to the nurse of Dr Tomi Likens according to her VM she left

## 2018-04-17 ENCOUNTER — Telehealth: Payer: Self-pay | Admitting: Neurology

## 2018-04-17 ENCOUNTER — Other Ambulatory Visit: Payer: Self-pay

## 2018-04-17 MED ORDER — GABAPENTIN 300 MG PO CAPS: 300.0000 mg | ORAL_CAPSULE | Freq: Three times a day (TID) | ORAL | 3 refills | Status: DC

## 2018-04-17 NOTE — Telephone Encounter (Signed)
Patient returning your call.  Thanks.

## 2018-04-17 NOTE — Telephone Encounter (Signed)
Called and spoke with Pt. She needed Gabapentin sent in for 300 mg TID, 90 day supply [facility]

## 2018-10-22 DIAGNOSIS — Z8262 Family history of osteoporosis: Secondary | ICD-10-CM | POA: Diagnosis not present

## 2018-10-22 DIAGNOSIS — Z1231 Encounter for screening mammogram for malignant neoplasm of breast: Secondary | ICD-10-CM | POA: Diagnosis not present

## 2018-10-22 DIAGNOSIS — M8589 Other specified disorders of bone density and structure, multiple sites: Secondary | ICD-10-CM | POA: Diagnosis not present

## 2018-10-22 DIAGNOSIS — R29890 Loss of height: Secondary | ICD-10-CM | POA: Diagnosis not present

## 2018-11-05 DIAGNOSIS — M25561 Pain in right knee: Secondary | ICD-10-CM | POA: Diagnosis not present

## 2018-11-05 DIAGNOSIS — M47817 Spondylosis without myelopathy or radiculopathy, lumbosacral region: Secondary | ICD-10-CM | POA: Diagnosis not present

## 2018-11-05 DIAGNOSIS — M545 Low back pain: Secondary | ICD-10-CM | POA: Diagnosis not present

## 2018-11-12 DIAGNOSIS — Z0000 Encounter for general adult medical examination without abnormal findings: Secondary | ICD-10-CM | POA: Diagnosis not present

## 2018-11-12 DIAGNOSIS — M8580 Other specified disorders of bone density and structure, unspecified site: Secondary | ICD-10-CM | POA: Diagnosis not present

## 2018-11-12 DIAGNOSIS — E559 Vitamin D deficiency, unspecified: Secondary | ICD-10-CM | POA: Diagnosis not present

## 2018-11-12 DIAGNOSIS — J449 Chronic obstructive pulmonary disease, unspecified: Secondary | ICD-10-CM | POA: Diagnosis not present

## 2018-11-12 DIAGNOSIS — E785 Hyperlipidemia, unspecified: Secondary | ICD-10-CM | POA: Diagnosis not present

## 2018-11-12 DIAGNOSIS — I10 Essential (primary) hypertension: Secondary | ICD-10-CM | POA: Diagnosis not present

## 2018-11-12 DIAGNOSIS — R7303 Prediabetes: Secondary | ICD-10-CM | POA: Diagnosis not present

## 2018-11-13 DIAGNOSIS — M25561 Pain in right knee: Secondary | ICD-10-CM | POA: Diagnosis not present

## 2018-11-20 DIAGNOSIS — M25561 Pain in right knee: Secondary | ICD-10-CM | POA: Diagnosis not present

## 2018-11-20 DIAGNOSIS — S83221A Peripheral tear of medial meniscus, current injury, right knee, initial encounter: Secondary | ICD-10-CM | POA: Diagnosis not present

## 2018-11-27 DIAGNOSIS — Z961 Presence of intraocular lens: Secondary | ICD-10-CM | POA: Diagnosis not present

## 2018-11-27 DIAGNOSIS — H43811 Vitreous degeneration, right eye: Secondary | ICD-10-CM | POA: Diagnosis not present

## 2018-11-27 DIAGNOSIS — H5500 Unspecified nystagmus: Secondary | ICD-10-CM | POA: Diagnosis not present

## 2018-11-27 DIAGNOSIS — H52203 Unspecified astigmatism, bilateral: Secondary | ICD-10-CM | POA: Diagnosis not present

## 2018-12-16 DIAGNOSIS — E785 Hyperlipidemia, unspecified: Secondary | ICD-10-CM | POA: Diagnosis not present

## 2018-12-16 DIAGNOSIS — E559 Vitamin D deficiency, unspecified: Secondary | ICD-10-CM | POA: Diagnosis not present

## 2018-12-16 DIAGNOSIS — I10 Essential (primary) hypertension: Secondary | ICD-10-CM | POA: Diagnosis not present

## 2018-12-16 DIAGNOSIS — R7309 Other abnormal glucose: Secondary | ICD-10-CM | POA: Diagnosis not present

## 2018-12-16 DIAGNOSIS — Z23 Encounter for immunization: Secondary | ICD-10-CM | POA: Diagnosis not present

## 2019-01-19 ENCOUNTER — Ambulatory Visit: Payer: Medicare Other | Admitting: Neurology

## 2019-01-22 ENCOUNTER — Inpatient Hospital Stay (HOSPITAL_COMMUNITY)
Admission: EM | Admit: 2019-01-22 | Discharge: 2019-01-28 | DRG: 377 | Disposition: A | Discharge status: Home / Self Care | Payer: Medicare Other | Attending: Internal Medicine | Admitting: Internal Medicine

## 2019-01-22 ENCOUNTER — Other Ambulatory Visit: Payer: Self-pay

## 2019-01-22 ENCOUNTER — Encounter (HOSPITAL_COMMUNITY): Payer: Self-pay | Admitting: Emergency Medicine

## 2019-01-22 ENCOUNTER — Telehealth: Payer: Self-pay | Admitting: Neurology

## 2019-01-22 DIAGNOSIS — Z8673 Personal history of transient ischemic attack (TIA), and cerebral infarction without residual deficits: Secondary | ICD-10-CM

## 2019-01-22 DIAGNOSIS — R0600 Dyspnea, unspecified: Secondary | ICD-10-CM

## 2019-01-22 DIAGNOSIS — Z03818 Encounter for observation for suspected exposure to other biological agents ruled out: Secondary | ICD-10-CM | POA: Diagnosis not present

## 2019-01-22 DIAGNOSIS — R0602 Shortness of breath: Secondary | ICD-10-CM

## 2019-01-22 DIAGNOSIS — Z7289 Other problems related to lifestyle: Secondary | ICD-10-CM

## 2019-01-22 DIAGNOSIS — I10 Essential (primary) hypertension: Secondary | ICD-10-CM | POA: Diagnosis not present

## 2019-01-22 DIAGNOSIS — Z79899 Other long term (current) drug therapy: Secondary | ICD-10-CM

## 2019-01-22 DIAGNOSIS — R918 Other nonspecific abnormal finding of lung field: Secondary | ICD-10-CM

## 2019-01-22 DIAGNOSIS — I5033 Acute on chronic diastolic (congestive) heart failure: Secondary | ICD-10-CM | POA: Diagnosis not present

## 2019-01-22 DIAGNOSIS — K921 Melena: Secondary | ICD-10-CM | POA: Diagnosis not present

## 2019-01-22 DIAGNOSIS — J690 Pneumonitis due to inhalation of food and vomit: Secondary | ICD-10-CM | POA: Diagnosis not present

## 2019-01-22 DIAGNOSIS — I959 Hypotension, unspecified: Secondary | ICD-10-CM | POA: Diagnosis not present

## 2019-01-22 DIAGNOSIS — N179 Acute kidney failure, unspecified: Secondary | ICD-10-CM | POA: Diagnosis present

## 2019-01-22 DIAGNOSIS — R001 Bradycardia, unspecified: Secondary | ICD-10-CM | POA: Diagnosis not present

## 2019-01-22 DIAGNOSIS — Z85820 Personal history of malignant melanoma of skin: Secondary | ICD-10-CM

## 2019-01-22 DIAGNOSIS — K449 Diaphragmatic hernia without obstruction or gangrene: Secondary | ICD-10-CM | POA: Diagnosis present

## 2019-01-22 DIAGNOSIS — A419 Sepsis, unspecified organism: Secondary | ICD-10-CM | POA: Diagnosis not present

## 2019-01-22 DIAGNOSIS — Z20828 Contact with and (suspected) exposure to other viral communicable diseases: Secondary | ICD-10-CM | POA: Diagnosis present

## 2019-01-22 DIAGNOSIS — J441 Chronic obstructive pulmonary disease with (acute) exacerbation: Secondary | ICD-10-CM | POA: Diagnosis not present

## 2019-01-22 DIAGNOSIS — Z7982 Long term (current) use of aspirin: Secondary | ICD-10-CM

## 2019-01-22 DIAGNOSIS — Z7951 Long term (current) use of inhaled steroids: Secondary | ICD-10-CM

## 2019-01-22 DIAGNOSIS — J189 Pneumonia, unspecified organism: Secondary | ICD-10-CM

## 2019-01-22 DIAGNOSIS — Z87891 Personal history of nicotine dependence: Secondary | ICD-10-CM

## 2019-01-22 DIAGNOSIS — K922 Gastrointestinal hemorrhage, unspecified: Secondary | ICD-10-CM | POA: Diagnosis present

## 2019-01-22 DIAGNOSIS — D62 Acute posthemorrhagic anemia: Secondary | ICD-10-CM

## 2019-01-22 DIAGNOSIS — J449 Chronic obstructive pulmonary disease, unspecified: Secondary | ICD-10-CM | POA: Diagnosis present

## 2019-01-22 DIAGNOSIS — G501 Atypical facial pain: Secondary | ICD-10-CM | POA: Diagnosis present

## 2019-01-22 DIAGNOSIS — E876 Hypokalemia: Secondary | ICD-10-CM | POA: Diagnosis present

## 2019-01-22 DIAGNOSIS — B0221 Postherpetic geniculate ganglionitis: Secondary | ICD-10-CM

## 2019-01-22 DIAGNOSIS — J90 Pleural effusion, not elsewhere classified: Secondary | ICD-10-CM

## 2019-01-22 DIAGNOSIS — J9601 Acute respiratory failure with hypoxia: Secondary | ICD-10-CM | POA: Diagnosis not present

## 2019-01-22 DIAGNOSIS — K254 Chronic or unspecified gastric ulcer with hemorrhage: Principal | ICD-10-CM | POA: Diagnosis present

## 2019-01-22 DIAGNOSIS — Z90710 Acquired absence of both cervix and uterus: Secondary | ICD-10-CM

## 2019-01-22 DIAGNOSIS — E871 Hypo-osmolality and hyponatremia: Secondary | ICD-10-CM | POA: Diagnosis present

## 2019-01-22 DIAGNOSIS — I110 Hypertensive heart disease with heart failure: Secondary | ICD-10-CM | POA: Diagnosis present

## 2019-01-22 DIAGNOSIS — E0590 Thyrotoxicosis, unspecified without thyrotoxic crisis or storm: Secondary | ICD-10-CM | POA: Diagnosis present

## 2019-01-22 DIAGNOSIS — E860 Dehydration: Secondary | ICD-10-CM | POA: Diagnosis present

## 2019-01-22 DIAGNOSIS — D649 Anemia, unspecified: Secondary | ICD-10-CM | POA: Diagnosis not present

## 2019-01-22 DIAGNOSIS — K920 Hematemesis: Secondary | ICD-10-CM | POA: Diagnosis not present

## 2019-01-22 DIAGNOSIS — E785 Hyperlipidemia, unspecified: Secondary | ICD-10-CM

## 2019-01-22 DIAGNOSIS — I480 Paroxysmal atrial fibrillation: Secondary | ICD-10-CM | POA: Diagnosis not present

## 2019-01-22 DIAGNOSIS — Z791 Long term (current) use of non-steroidal anti-inflammatories (NSAID): Secondary | ICD-10-CM

## 2019-01-22 DIAGNOSIS — I639 Cerebral infarction, unspecified: Secondary | ICD-10-CM

## 2019-01-22 LAB — URINALYSIS, ROUTINE W REFLEX MICROSCOPIC
Bacteria, UA: NONE SEEN
Bilirubin Urine: NEGATIVE
Glucose, UA: NEGATIVE mg/dL
Ketones, ur: NEGATIVE mg/dL
Leukocytes,Ua: NEGATIVE
Nitrite: NEGATIVE
Protein, ur: NEGATIVE mg/dL
Specific Gravity, Urine: 1.016 (ref 1.005–1.030)
pH: 6 (ref 5.0–8.0)

## 2019-01-22 LAB — COMPREHENSIVE METABOLIC PANEL
ALT: 18 U/L (ref 0–44)
ALT: 15 U/L (ref 0–44)
AST: 19 U/L (ref 15–41)
AST: 15 U/L (ref 15–41)
Albumin: 4.3 g/dL (ref 3.5–5.0)
Albumin: 3.7 g/dL (ref 3.5–5.0)
Alkaline Phosphatase: 43 U/L (ref 38–126)
Alkaline Phosphatase: 35 U/L — ABNORMAL LOW (ref 38–126)
Anion gap: 13 (ref 5–15)
Anion gap: 7 (ref 5–15)
BUN: 54 mg/dL — ABNORMAL HIGH (ref 8–23)
BUN: 42 mg/dL — ABNORMAL HIGH (ref 8–23)
CO2: 24 mmol/L (ref 22–32)
CO2: 24 mmol/L (ref 22–32)
Calcium: 8.8 mg/dL — ABNORMAL LOW (ref 8.9–10.3)
Calcium: 8.5 mg/dL — ABNORMAL LOW (ref 8.9–10.3)
Chloride: 95 mmol/L — ABNORMAL LOW (ref 98–111)
Chloride: 107 mmol/L (ref 98–111)
Creatinine, Ser: 1.1 mg/dL — ABNORMAL HIGH (ref 0.44–1.00)
Creatinine, Ser: 0.86 mg/dL (ref 0.44–1.00)
GFR calc Af Amer: 56 mL/min — ABNORMAL LOW (ref >60)
GFR calc Af Amer: >60 mL/min (ref >60)
GFR calc non Af Amer: 48 mL/min — ABNORMAL LOW (ref >60)
GFR calc non Af Amer: >60 mL/min (ref >60)
Glucose, Bld: 172 mg/dL — ABNORMAL HIGH (ref 70–99)
Glucose, Bld: 112 mg/dL — ABNORMAL HIGH (ref 70–99)
Potassium: 3.2 mmol/L — ABNORMAL LOW (ref 3.5–5.1)
Potassium: 4.3 mmol/L (ref 3.5–5.1)
Sodium: 132 mmol/L — ABNORMAL LOW (ref 135–145)
Sodium: 138 mmol/L (ref 135–145)
Total Bilirubin: 0.5 mg/dL (ref 0.3–1.2)
Total Bilirubin: 0.7 mg/dL (ref 0.3–1.2)
Total Protein: 6.9 g/dL (ref 6.5–8.1)
Total Protein: 5.8 g/dL — ABNORMAL LOW (ref 6.5–8.1)

## 2019-01-22 LAB — POC OCCULT BLOOD, ED: Fecal Occult Bld: POSITIVE — AB

## 2019-01-22 LAB — HEMOGLOBIN AND HEMATOCRIT, BLOOD
HCT: 23.1 % — ABNORMAL LOW (ref 36.0–46.0)
Hemoglobin: 7.2 g/dL — ABNORMAL LOW (ref 12.0–15.0)

## 2019-01-22 LAB — CBC
HCT: 28.2 % — ABNORMAL LOW (ref 36.0–46.0)
Hemoglobin: 9 g/dL — ABNORMAL LOW (ref 12.0–15.0)
MCH: 30.8 pg (ref 26.0–34.0)
MCHC: 31.9 g/dL (ref 30.0–36.0)
MCV: 96.6 fL (ref 80.0–100.0)
Platelets: 261 10*3/uL (ref 150–400)
RBC: 2.92 MIL/uL — ABNORMAL LOW (ref 3.87–5.11)
RDW: 12.8 % (ref 11.5–15.5)
WBC: 9.6 10*3/uL (ref 4.0–10.5)
nRBC: 0 % (ref 0.0–0.2)

## 2019-01-22 LAB — PREPARE RBC (CROSSMATCH)

## 2019-01-22 LAB — SARS CORONAVIRUS 2 (TAT 6-24 HRS): SARS Coronavirus 2: NEGATIVE

## 2019-01-22 LAB — ABO/RH: ABO/RH(D): A POS

## 2019-01-22 LAB — LIPASE, BLOOD: Lipase: 26 U/L (ref 11–51)

## 2019-01-22 MED ORDER — FOLIC ACID 1 MG PO TABS
1.0000 mg | ORAL_TABLET | Freq: Every day | ORAL | Status: DC
  Administered 2019-01-25 – 2019-01-28 (×4): 1 mg via ORAL
  Filled 2019-01-22 (×7): qty 1

## 2019-01-22 MED ORDER — GABAPENTIN 300 MG PO CAPS
900.0000 mg | ORAL_CAPSULE | Freq: Three times a day (TID) | ORAL | Status: DC
  Administered 2019-01-22 – 2019-01-28 (×16): 900 mg via ORAL
  Filled 2019-01-22 (×17): qty 3

## 2019-01-22 MED ORDER — POLYVINYL ALCOHOL 1.4 % OP SOLN
1.0000 [drp] | OPHTHALMIC | Status: DC | PRN
  Filled 2019-01-22: qty 15

## 2019-01-22 MED ORDER — ADULT MULTIVITAMIN W/MINERALS CH
1.0000 | ORAL_TABLET | Freq: Every day | ORAL | Status: DC
  Administered 2019-01-25 – 2019-01-28 (×4): 1 via ORAL
  Filled 2019-01-22 (×6): qty 1

## 2019-01-22 MED ORDER — FENTANYL CITRATE (PF) 100 MCG/2ML IJ SOLN
12.5000 ug | INTRAMUSCULAR | Status: DC | PRN
  Administered 2019-01-22 – 2019-01-23 (×3): 12.5 ug via INTRAVENOUS
  Filled 2019-01-22 (×3): qty 2

## 2019-01-22 MED ORDER — LACTATED RINGERS IV BOLUS
1000.0000 mL | Freq: Once | INTRAVENOUS | Status: AC
  Administered 2019-01-22: 1000 mL via INTRAVENOUS

## 2019-01-22 MED ORDER — SODIUM CHLORIDE 0.9 % IV SOLN
INTRAVENOUS | Status: DC
  Administered 2019-01-22 – 2019-01-23 (×2): via INTRAVENOUS

## 2019-01-22 MED ORDER — FLUTICASONE FUROATE-VILANTEROL 100-25 MCG/INH IN AEPB
1.0000 | INHALATION_SPRAY | Freq: Every day | RESPIRATORY_TRACT | Status: DC
  Administered 2019-01-23 – 2019-01-28 (×6): 1 via RESPIRATORY_TRACT
  Filled 2019-01-22: qty 28

## 2019-01-22 MED ORDER — ACETAMINOPHEN 650 MG RE SUPP: 650.0000 mg | Freq: Four times a day (QID) | RECTAL | Status: DC | PRN

## 2019-01-22 MED ORDER — VITAMIN B-1 100 MG PO TABS
100.0000 mg | ORAL_TABLET | Freq: Every day | ORAL | Status: DC
  Administered 2019-01-25 – 2019-01-28 (×4): 100 mg via ORAL
  Filled 2019-01-22 (×7): qty 1

## 2019-01-22 MED ORDER — LORAZEPAM 1 MG PO TABS: 1.0000 mg | ORAL_TABLET | ORAL | Status: AC | PRN

## 2019-01-22 MED ORDER — POTASSIUM CHLORIDE 10 MEQ/100ML IV SOLN
10.0000 meq | INTRAVENOUS | Status: AC
  Administered 2019-01-22 (×3): 10 meq via INTRAVENOUS
  Filled 2019-01-22 (×3): qty 100

## 2019-01-22 MED ORDER — GABAPENTIN 600 MG PO TABS: ORAL_TABLET | ORAL | 0 refills | Status: DC

## 2019-01-22 MED ORDER — ONDANSETRON HCL 4 MG/2ML IJ SOLN
4.0000 mg | Freq: Four times a day (QID) | INTRAMUSCULAR | Status: DC | PRN
  Administered 2019-01-22: 4 mg via INTRAVENOUS
  Filled 2019-01-22: qty 2

## 2019-01-22 MED ORDER — PROMETHAZINE HCL 25 MG/ML IJ SOLN
INTRAMUSCULAR | Status: AC
  Administered 2019-01-22: 12.5 mg via INTRAVENOUS
  Filled 2019-01-22: qty 1

## 2019-01-22 MED ORDER — PROMETHAZINE HCL 25 MG/ML IJ SOLN
12.5000 mg | Freq: Once | INTRAMUSCULAR | Status: AC
  Administered 2019-01-22: 12:00:00 12.5 mg via INTRAVENOUS

## 2019-01-22 MED ORDER — THIAMINE HCL 100 MG/ML IJ SOLN: 100.0000 mg | Freq: Every day | INTRAMUSCULAR | Status: DC

## 2019-01-22 MED ORDER — UMECLIDINIUM BROMIDE 62.5 MCG/INH IN AEPB
1.0000 | INHALATION_SPRAY | Freq: Every day | RESPIRATORY_TRACT | Status: DC
  Administered 2019-01-23 – 2019-01-28 (×6): 1 via RESPIRATORY_TRACT
  Filled 2019-01-22: qty 7

## 2019-01-22 MED ORDER — SODIUM CHLORIDE 0.9% IV SOLUTION
Freq: Once | INTRAVENOUS | Status: AC
  Administered 2019-01-22: via INTRAVENOUS

## 2019-01-22 MED ORDER — SODIUM CHLORIDE 0.9% IV SOLUTION: Freq: Once | INTRAVENOUS | Status: AC

## 2019-01-22 MED ORDER — SODIUM CHLORIDE 0.9 % IV SOLN
80.0000 mg | Freq: Once | INTRAVENOUS | Status: AC
  Administered 2019-01-22: 80 mg via INTRAVENOUS
  Filled 2019-01-22: qty 80

## 2019-01-22 MED ORDER — ONDANSETRON HCL 4 MG PO TABS: 4.0000 mg | ORAL_TABLET | Freq: Four times a day (QID) | ORAL | Status: DC | PRN

## 2019-01-22 MED ORDER — SODIUM CHLORIDE 0.9 % IV SOLN
8.0000 mg/h | INTRAVENOUS | Status: DC
  Administered 2019-01-22 – 2019-01-23 (×2): 8 mg/h via INTRAVENOUS
  Filled 2019-01-22 (×3): qty 80

## 2019-01-22 MED ORDER — GABAPENTIN 600 MG PO TABS: 600.0000 mg | ORAL_TABLET | Freq: Three times a day (TID) | ORAL | Status: DC

## 2019-01-22 MED ORDER — LORAZEPAM 2 MG/ML IJ SOLN: 1.0000 mg | INTRAMUSCULAR | Status: AC | PRN

## 2019-01-22 MED ORDER — PANTOPRAZOLE SODIUM 40 MG IV SOLR: 40.0000 mg | Freq: Two times a day (BID) | INTRAVENOUS | Status: DC

## 2019-01-22 MED ORDER — FLUTICASONE-UMECLIDIN-VILANT 100-62.5-25 MCG/INH IN AEPB: 1.0000 | INHALATION_SPRAY | Freq: Every day | RESPIRATORY_TRACT | Status: DC | PRN

## 2019-01-22 MED ORDER — ACETAMINOPHEN 325 MG PO TABS
650.0000 mg | ORAL_TABLET | Freq: Four times a day (QID) | ORAL | Status: DC | PRN
  Administered 2019-01-23 – 2019-01-27 (×4): 650 mg via ORAL
  Filled 2019-01-22 (×4): qty 2

## 2019-01-22 NOTE — Consult Note (Signed)
[facility] Consultation Note  Referring Provider: [facility] Primary Care Physician:  [name], MD  Reason for Consultation:  Gi bleeding  HPI: Brittany Oliver is a 78 y.o. female we've been asked to see for GI bleeding.  Starting yesterday, patient had some generalized abdominal discomfort, bloody diarrhea becoming more melenic, and nausea/vomiting with possible coffee ground component.  Mild abdominal pain.  Takes NSAIDs several per day for years for facial neuralgias.  Last colonoscopy 2007 showed some hyperplastic polyps.  No prior endoscopy.     Past Medical History:  Diagnosis Date  . Arthritis   . Cancer ([place]) 02/2013   skin melanoma  . Heart murmur   . Hyperlipidemia   . Hypertension   . Neuralgia facialis vera   . Stroke [facility]) 1987   has a little balance issue since stroke    Past Surgical History:  Procedure Laterality Date  . ABDOMINAL HYSTERECTOMY  1974   pt still has her uterus, took only 1 ovary and 1 tube  . APPENDECTOMY    . COLONOSCOPY    . DILATION AND CURETTAGE OF UTERUS  1965  . EXCISION MELANOMA WITH SENTINEL LYMPH NODE BIOPSY Left 03/27/2013   Procedure: EXCISION MELANOMA LEFT POSTERIOR ARM WITH LEFT AXILLARY SENTINEL LYMPH NODE BIOPSY;  Surgeon: Zenovia Jarred, MD;  Location: [place];  Service: General;  Laterality: Left;  . EYE SURGERY Bilateral 2006   cataract and laser surgery  . FINGER SURGERY Right    middle finger  . MASS EXCISION Left 09/15/2013   Procedure: EXCISION MASS LEFT UPPER ARM;  Surgeon: Zenovia Jarred, MD;  Location: [place];  Service: General;  Laterality: Left;  . TONSILLECTOMY      Prior to Admission medications   Medication Sig Start Date End Date Taking? Authorizing Provider  aspirin 81 MG chewable tablet Chew 81 mg by mouth daily.   Yes [provider]  Calcium Carbonate-Vitamin D (CALCIUM 600/VITAMIN D) 600-400 MG-UNIT chew tablet Chew 1 tablet by mouth daily.   Yes [provider]   FLECTOR 1.3 % PTCH Place 1 patch onto the skin daily.  11/23/14  Yes [provider]  Fluticasone-Umeclidin-Vilant (TRELEGY ELLIPTA) 100-62.5-25 MCG/INH AEPB Inhale into the lungs.   Yes [provider]  gabapentin (NEURONTIN) 300 MG capsule Take 1 capsule (300 mg total) by mouth 3 (three) times daily. 04/17/18  Yes Tomi Likens, Adam R, DO  gabapentin (NEURONTIN) 600 MG tablet Take 1 tablet with 1 300mg  tablet three times daily 01/22/19  Yes Jaffe, Adam R, DO  ibuprofen (ADVIL) 200 MG tablet Take 800 mg by mouth every 6 (six) hours as needed for fever, headache or moderate pain.   Yes [provider]  ondansetron (ZOFRAN) 8 MG tablet Take 8 mg by mouth once.   Yes [provider]  polyvinyl alcohol (LIQUIFILM TEARS) 1.4 % ophthalmic solution Place 1 drop into both eyes as needed for dry eyes.   Yes [provider]  pravastatin (PRAVACHOL) 80 MG tablet Take 80 mg by mouth daily.   Yes [provider]  valsartan-hydrochlorothiazide (DIOVAN-HCT) 320-12.5 MG tablet Take 1 tablet by mouth daily. 11/22/18  Yes [provider]  vitamin C (ASCORBIC ACID) 500 MG tablet Take 500 mg by mouth daily.   Yes [provider]    Current Facility-Administered Medications  Medication Dose Route Frequency Provider Last Rate Last Dose  . fentaNYL (SUBLIMAZE) injection 12.5 mcg  12.5 mcg Intravenous Q2H PRN Regalado, Cassie Freer, MD  12.5 mcg at 01/22/19 1451  . pantoprazole (PROTONIX) 80 mg in sodium chloride 0.9 % 250 mL (0.32 mg/mL) infusion  8 mg/hr Intravenous Continuous Regalado, Belkys A, MD      . Derrill Memo ON 01/25/2019] pantoprazole (PROTONIX) injection 40 mg  40 mg Intravenous Q12H Regalado, Belkys A, MD      . potassium chloride 10 mEq in 100 mL IVPB  10 mEq Intravenous Q1 Hr x 3 Regalado, Belkys A, MD 90 mL/hr at 01/22/19 1519 10 mEq at 01/22/19 1519   Current Outpatient Medications  Medication Sig Dispense Refill  . aspirin 81 MG chewable tablet  Chew 81 mg by mouth daily.    . Calcium Carbonate-Vitamin D (CALCIUM 600/VITAMIN D) 600-400 MG-UNIT chew tablet Chew 1 tablet by mouth daily.    Marland Kitchen FLECTOR 1.3 % PTCH Place 1 patch onto the skin daily.     . Fluticasone-Umeclidin-Vilant (TRELEGY ELLIPTA) 100-62.5-25 MCG/INH AEPB Inhale into the lungs.    . gabapentin (NEURONTIN) 300 MG capsule Take 1 capsule (300 mg total) by mouth 3 (three) times daily. 270 capsule 3  . gabapentin (NEURONTIN) 600 MG tablet Take 1 tablet with 1 300mg  tablet three times daily 270 tablet 0  . ibuprofen (ADVIL) 200 MG tablet Take 800 mg by mouth every 6 (six) hours as needed for fever, headache or moderate pain.    Marland Kitchen ondansetron (ZOFRAN) 8 MG tablet Take 8 mg by mouth once.    . polyvinyl alcohol (LIQUIFILM TEARS) 1.4 % ophthalmic solution Place 1 drop into both eyes as needed for dry eyes.    . pravastatin (PRAVACHOL) 80 MG tablet Take 80 mg by mouth daily.    . valsartan-hydrochlorothiazide (DIOVAN-HCT) 320-12.5 MG tablet Take 1 tablet by mouth daily.    . vitamin C (ASCORBIC ACID) 500 MG tablet Take 500 mg by mouth daily.      Allergies as of 01/22/2019  . (No Known Allergies)    Family History  Adopted: Yes  Problem Relation Age of Onset  . Ataxia Neg Hx   . Chorea Neg Hx   . Dementia Neg Hx   . Mental retardation Neg Hx   . Migraines Neg Hx   . Multiple sclerosis Neg Hx   . Neurofibromatosis Neg Hx   . Neuropathy Neg Hx   . Parkinsonism Neg Hx   . Seizures Neg Hx   . Stroke Neg Hx     Social History   Socioeconomic History  . Marital status: Married    Spouse name: Not on file  . Number of children: Not on file  . Years of education: Not on file  . Highest education level: Not on file  Occupational History  . Not on file  Social Needs  . Financial resource strain: Not on file  . Food insecurity    Worry: Not on file    Inability: Not on file  . Transportation needs    Medical: Not on file    Non-medical: Not on file  Tobacco Use   . Smoking status: Former Smoker    Packs/day: 0.50    Years: 43.20    Pack years: 21.60    Types: Cigarettes    Quit date: 03/23/2005    Years since quitting: 13.8  . Smokeless tobacco: Never Used  . Tobacco comment: QUIT 9 YEARS AGO  Substance and Sexual Activity  . Alcohol use: Yes    Alcohol/week: 5.0 standard drinks    Types: 5 Glasses of wine per week  .  Drug use: No  . Sexual activity: Yes    Partners: Male  Lifestyle  . Physical activity    Days per week: Not on file    Minutes per session: Not on file  . Stress: Not on file  Relationships  . Social Herbalist on phone: Not on file    Gets together: Not on file    Attends religious service: Not on file    Active member of club or organization: Not on file    Attends meetings of clubs or organizations: Not on file    Relationship status: Not on file  . Intimate partner violence    Fear of current or ex partner: Not on file    Emotionally abused: Not on file    Physically abused: Not on file    Forced sexual activity: Not on file  Other Topics Concern  . Not on file  Social History Narrative  . Not on file    Review of Systems: As per HPI, all others negative  Physical Exam: Vital signs in last 24 hours: Temp:  [98.3 F (36.8 C)] 98.3 F (36.8 C) (11/05 1101) Pulse Rate:  [120-159] 159 (11/05 1516) Resp:  [16-24] 24 (11/05 1516) BP: (109-117)/(62-67) 117/67 (11/05 1516) SpO2:  [88 %-100 %] 88 % (11/05 1516)   General:   Alert,  Well-developed, well-nourished, pleasant and cooperative in NAD Head:  Normocephalic and atraumatic. Eyes:  Sclera clear, no icterus.   Conjunctiva pink. Ears:  Normal auditory acuity. Nose:  No deformity, discharge,  or lesions. Mouth:  No deformity or lesions.  Oropharynx pink & moist. Neck:  Supple; no masses or thyromegaly. Lungs:  Clear throughout to auscultation.   No wheezes, crackles, or rhonchi. No acute distress. Heart:  Regular rate and rhythm; no murmurs,  clicks, rubs,  or gallops. Abdomen:  Soft, mild protuberant, mild epigastric tenderness without peritonitis. No masses, hepatosplenomegaly or hernias noted. Normal bowel sounds, without guarding, and without rebound.     Msk:  Symmetrical without gross deformities. Normal posture. Pulses:  Normal pulses noted. Extremities:  Without clubbing or edema. Neurologic:  Alert and  oriented x4;  grossly normal neurologically. Skin:  Intact without significant lesions or rashes. Cervical Nodes:  No significant cervical adenopathy. Psych:  Alert and cooperative. Normal mood and affect.   Lab Results: Recent Labs    01/22/19 1131  WBC 9.6  HGB 9.0*  HCT 28.2*  PLT 261   BMET Recent Labs    01/22/19 1131  NA 132*  K 3.2*  CL 95*  CO2 24  GLUCOSE 172*  BUN 54*  CREATININE 1.10*  CALCIUM 8.8*   LFT Recent Labs    01/22/19 1131  PROT 6.9  ALBUMIN 4.3  AST 19  ALT 18  ALKPHOS 43  BILITOT 0.5   PT/INR No results for input(s): LABPROT, INR in the last 72 hours.  Studies/Results: No results found.  Impression:  1.  Nausea, vomiting, coffee ground emesis. 2.  Melena with elevated BUN.  Overall suspect upper GI bleeding. 3.  Acute blood loss anemia.  Plan:  1.  Clear liquid diet, NPO after midnight. 2.  PPI. 3.  Volume repletion, serial CBCs, transfuse as needed. 4.  Endoscopy tomorrow for further evaluation. 5.  Risks (bleeding, infection, bowel perforation that could require surgery, sedation-related changes in cardiopulmonary systems), benefits (identification and possible treatment of source of symptoms, exclusion of certain causes of symptoms), and alternatives (watchful waiting, radiographic imaging studies, empiric  medical treatment) of upper endoscopy (EGD) were explained to patient/family in detail and patient wishes to proceed. 6.  Next step in management pending endoscopy findings. 7.  [facility] will follow.   LOS: 0 days   Myrella Fahs M  01/22/2019, 3:56  PM  Cell [phone] If no answer or after 5 PM call [phone]

## 2019-01-22 NOTE — Progress Notes (Deleted)
NEUROLOGY FOLLOW UP OFFICE NOTE  Brittany Oliver NZ:[id number]  HISTORY OF PRESENT ILLNESS: Brittany Oliver is a 78 year old right-handed woman with hypertension, hypercholesterolemia, and history of posterior circulation stroke who follows up for atypical left-sided facial pain.  UPDATE:  Current medication: Gabapentin 900 mg 3 times daily. Facial pain is controlled.    HISTORY:  She began experiencing left-sided facial pain and 2006.She reports a fairly constant burning and pins and needles sensation in the left V1 and V2 distribution.Light touch, cold and wind can trigger a shooting pain down the face, but there is no spontaneous paroxysmal shooting pain. Eating and brushing her teeth does not aggravate it.She says heat seems to help relieve it.She was eventually diagnosed with left trigeminal neuralgia in 2012.  Past medications:Trileptal (caused hyponatremia) Other therapy:Gamma Knife x2 (ineffective).  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  She does have a history of brainstem stroke in 1987, in which she presented with left oculomotor weakness, left rotatory nystagmus, left facial numbness and dysfunction of her right arm and leg.She still has residual numbness of the left face.  In 2018, she endorsed balance problems and exhibited some symptoms of neuropathy in the feet.  She underwent work-up for neuropathy.  ANA, sed rate, B12, B6, TSH, SPEP and IFE were unremarkable.  She was referred to physical therapy to help with balance.  It was minimally effective but may have not been as proactive in home exercises.  She has to think before turning around too fast.  No associated dizziness.  PAST MEDICAL HISTORY: Past Medical History:  Diagnosis Date  . Arthritis   . Cancer ([place]) 02/2013    skin melanoma  . Heart murmur   . Hyperlipidemia   . Hypertension   . Neuralgia facialis vera   . Stroke [facility]) 1987   has a little balance issue since stroke    MEDICATIONS: Current Outpatient Medications on File Prior to Visit  Medication Sig Dispense Refill  . Calcium Citrate-Vitamin D (CALCIUM + D PO) Take 1 capsule by mouth daily.    Marland Kitchen FLECTOR 1.3 % PTCH     . Fluticasone-Umeclidin-Vilant (TRELEGY ELLIPTA) 100-62.5-25 MCG/INH AEPB Inhale into the lungs.    . gabapentin (NEURONTIN) 300 MG capsule TAKE 3 CAPSULES (=900MG ) 3 TIMES A DAY (TOTAL OF      2700MG  DAILY) 810 capsule 3  . gabapentin (NEURONTIN) 300 MG capsule Take 1 capsule (300 mg total) by mouth daily. 90 capsule 3  . gabapentin (NEURONTIN) 300 MG capsule Take 1 capsule (300 mg total) by mouth 3 (three) times daily. 270 capsule 3  . gabapentin (NEURONTIN) 600 MG tablet Take 1 tablet with 1 300mg  tablet three times daily 270 tablet 3  . losartan-hydrochlorothiazide (HYZAAR) 100-12.5 MG tablet     . pravastatin (PRAVACHOL) 80 MG tablet Take 80 mg by mouth daily.    . SYMBICORT 160-4.5 MCG/ACT inhaler Inhale 2 puffs into the lungs 2 (two) times daily. (Patient not taking: Reported on 02/15/2017) 3 Inhaler 3  . tiotropium (SPIRIVA HANDIHALER) 18 MCG inhalation capsule Place 1 capsule (18 mcg total) into inhaler and inhale daily. (Patient not taking: Reported on 02/15/2017) 90 capsule 3  . vitamin C (ASCORBIC ACID) 500 MG tablet Take 500 mg by mouth daily.     No current facility-administered medications on file prior to visit.  ALLERGIES: No Known Allergies  FAMILY HISTORY: Family History  Adopted: Yes  Problem Relation Age of Onset  . Ataxia Neg Hx   . Chorea Neg Hx   . Dementia Neg Hx   . Mental retardation Neg Hx   . Migraines Neg Hx   . Multiple sclerosis Neg Hx   . Neurofibromatosis Neg Hx   . Neuropathy Neg Hx   . Parkinsonism Neg Hx   . Seizures Neg Hx   . Stroke Neg Hx    ***.  SOCIAL HISTORY:  Social History   Socioeconomic History  . Marital status: Married    Spouse name: Not on file  . Number of children: Not on file  . Years of education: Not on file  . Highest education level: Not on file  Occupational History  . Not on file  Social Needs  . Financial resource strain: Not on file  . Food insecurity    Worry: Not on file    Inability: Not on file  . Transportation needs    Medical: Not on file    Non-medical: Not on file  Tobacco Use  . Smoking status: Former Smoker    Packs/day: 0.50    Years: 43.20    Pack years: 21.60    Types: Cigarettes    Quit date: 03/23/2005    Years since quitting: 13.8  . Smokeless tobacco: Never Used  . Tobacco comment: QUIT 9 YEARS AGO  Substance and Sexual Activity  . Alcohol use: Yes    Alcohol/week: 5.0 standard drinks    Types: 5 Glasses of wine per week  . Drug use: No  . Sexual activity: Yes    Partners: Male  Lifestyle  . Physical activity    Days per week: Not on file    Minutes per session: Not on file  . Stress: Not on file  Relationships  . Social Herbalist on phone: Not on file    Gets together: Not on file    Attends religious service: Not on file    Active member of club or organization: Not on file    Attends meetings of clubs or organizations: Not on file    Relationship status: Not on file  . Intimate partner violence    Fear of current or ex partner: Not on file    Emotionally abused: Not on file    Physically abused: Not on file    Forced sexual activity: Not on file  Other Topics Concern  . Not on file  Social History Narrative  . Not on file    REVIEW OF SYSTEMS: Constitutional: No fevers, chills, or sweats, no generalized fatigue, change in appetite Eyes: No visual changes, double vision, eye pain Ear, nose and throat: No hearing loss, ear pain, nasal congestion, sore throat Cardiovascular: No chest pain, palpitations Respiratory:  No shortness of breath at rest or with  exertion, wheezes GastrointestinaI: No nausea, vomiting, diarrhea, abdominal pain, fecal incontinence Genitourinary:  No dysuria, urinary retention or frequency Musculoskeletal:  No neck pain, back pain Integumentary: No rash, pruritus, skin lesions Neurological: as above Psychiatric: No depression, insomnia, anxiety Endocrine: No palpitations, fatigue, diaphoresis, mood swings, change in appetite, change in weight, increased thirst Hematologic/Lymphatic:  No purpura, petechiae. Allergic/Immunologic: no itchy/runny eyes, nasal congestion, recent allergic reactions, rashes  PHYSICAL EXAM: *** General: No acute distress.  Patient appears ***-groomed.   Head:  Normocephalic/atraumatic Eyes:  Fundi examined but not visualized Neck: supple, no paraspinal tenderness, full range  of motion Heart:  Regular rate and rhythm Lungs:  Clear to auscultation bilaterally Back: No paraspinal tenderness Neurological Exam: alert and oriented to person, place, and time. Attention span and concentration intact, recent and remote memory intact, fund of knowledge intact.  Speech fluent and not dysarthric, language intact.  CN II-XII intact. Bulk and tone normal, muscle strength 5/5 throughout.  Sensation to pinprick reduced in feet.  Deep tendon reflexes 1+ throughout, toes downgoing.  Finger to nose testing intact.  Mildly wide-based gait.  Romberg with sway.  IMPRESSION: 1.  Atypical left-sided facial pain, stable 2.  Unsteady gait.  Neurologic exam is not convincing for intracranial abnormality or myelopathy.  She does not exhibit symptoms of Parkinson's disease.  Reduced sensation appreciated in the feet, suggesting possible underlying neuropathy.  At this point, we will defer further testing such as NCV-EMG as it would not change management (we already checked labs for common causes of neuropathy).  PLAN: ***  Brittany Clines, DO  CC: ***

## 2019-01-22 NOTE — ED Triage Notes (Signed)
Per pt, states vomiting and diarrhea since last night-states unable to keep anything down-worried she might be dehydrated-states stool and vomit are dark in color

## 2019-01-22 NOTE — H&P (Addendum)
History and Physical  Brittany Oliver [id number] DOB: [birth year] DOA: 01/22/2019  PCP: [name], MD Patient coming from: Home   I have personally briefly reviewed patient's old medical records in [place]   Chief Complaint: Nausea, vomiting, Diarrhea.   HPI: Brittany Oliver is a 78 y.o. female past medical history significant for stroke, neurology for Cialis.,  Hypertension, hyperlipidemia, history of skin melanoma who presents complaining of nausea vomiting, coffee-ground emesis more than 4 or 5 episodes since the day prior to admission.  Patient also report diarrhea 4-5 episodes of black stool.  She also reported mild abdominal discomfort.  She takes ibuprofen 800 mg in the morning and during the day as needed for pain she will take another 800 mg.  She denies any recent antibiotics intake, not sick contact.  No history of fever, cough, shortness of breath, sore throat. Daughter reports that patient drinks wine 6 to 8ounces every day, 2 to 3 glasses of wine.  Evaluation in the ED: Sodium 132, potassium 3.2, chloride 95, glucose 172, BUN 54, creatinine 1.1, calcium 8.8, hemoglobin 9.0, white blood cell 9.6, platelet 261, fecal occult blood positive.    Review of Systems: All systems reviewed and apart from history of presenting illness, are negative.  Past Medical History:  Diagnosis Date  . Arthritis   . Cancer ([place]) 02/2013   skin melanoma  . Heart murmur   . Hyperlipidemia   . Hypertension   . Neuralgia facialis vera   . Stroke [facility]) 1987   has a little balance issue since stroke   Past Surgical History:  Procedure Laterality Date  . ABDOMINAL HYSTERECTOMY  1974   pt still has her uterus, took only 1 ovary and 1 tube  . APPENDECTOMY    . COLONOSCOPY    . DILATION AND CURETTAGE OF UTERUS  1965  . EXCISION MELANOMA WITH SENTINEL LYMPH NODE BIOPSY Left 03/27/2013   Procedure: EXCISION MELANOMA LEFT POSTERIOR ARM WITH LEFT AXILLARY SENTINEL LYMPH  NODE BIOPSY;  Surgeon: Zenovia Jarred, MD;  Location: [place];  Service: General;  Laterality: Left;  . EYE SURGERY Bilateral 2006   cataract and laser surgery  . FINGER SURGERY Right    middle finger  . MASS EXCISION Left 09/15/2013   Procedure: EXCISION MASS LEFT UPPER ARM;  Surgeon: Zenovia Jarred, MD;  Location: [place];  Service: General;  Laterality: Left;  . TONSILLECTOMY     Social History:  reports that she quit smoking about 13 years ago. Her smoking use included cigarettes. She has a 21.60 pack-year smoking history. She has never used smokeless tobacco. She reports current alcohol use of about 5.0 standard drinks of alcohol per week. She reports that she does not use drugs.   No Known Allergies  Family History  Adopted: Yes  Problem Relation Age of Onset  . Ataxia Neg Hx   . Chorea Neg Hx   . Dementia Neg Hx   . Mental retardation Neg Hx   . Migraines Neg Hx   . Multiple sclerosis Neg Hx   . Neurofibromatosis Neg Hx   . Neuropathy Neg Hx   . Parkinsonism Neg Hx   . Seizures Neg Hx   . Stroke Neg Hx    Prior to Admission medications   Medication Sig Start Date End Date Taking? Authorizing Provider  aspirin 81 MG chewable tablet Chew 81 mg by mouth daily.   Yes [provider]  Calcium Carbonate-Vitamin D (CALCIUM 600/VITAMIN  D) 600-400 MG-UNIT chew tablet Chew 1 tablet by mouth daily.   Yes [provider]  FLECTOR 1.3 % PTCH Place 1 patch onto the skin daily.  11/23/14  Yes [provider]  Fluticasone-Umeclidin-Vilant (TRELEGY ELLIPTA) 100-62.5-25 MCG/INH AEPB Inhale into the lungs.   Yes [provider]  gabapentin (NEURONTIN) 300 MG capsule Take 1 capsule (300 mg total) by mouth 3 (three) times daily. 04/17/18  Yes Tomi Likens, Adam R, DO  gabapentin (NEURONTIN) 600 MG tablet Take 1 tablet with 1 300mg  tablet three times daily 01/22/19  Yes Jaffe, Adam R, DO  ibuprofen (ADVIL) 200 MG tablet Take 800 mg by mouth every 6 (six) hours as needed  for fever, headache or moderate pain.   Yes [provider]  ondansetron (ZOFRAN) 8 MG tablet Take 8 mg by mouth once.   Yes [provider]  polyvinyl alcohol (LIQUIFILM TEARS) 1.4 % ophthalmic solution Place 1 drop into both eyes as needed for dry eyes.   Yes [provider]  pravastatin (PRAVACHOL) 80 MG tablet Take 80 mg by mouth daily.   Yes [provider]  valsartan-hydrochlorothiazide (DIOVAN-HCT) 320-12.5 MG tablet Take 1 tablet by mouth daily. 11/22/18  Yes [provider]  vitamin C (ASCORBIC ACID) 500 MG tablet Take 500 mg by mouth daily.   Yes [provider]  SYMBICORT 160-4.5 MCG/ACT inhaler Inhale 2 puffs into the lungs 2 (two) times daily. Patient not taking: Reported on 02/15/2017 08/31/15   Noralee Space, MD  tiotropium (SPIRIVA HANDIHALER) 18 MCG inhalation capsule Place 1 capsule (18 mcg total) into inhaler and inhale daily. Patient not taking: Reported on 02/15/2017 08/31/15   Noralee Space, MD   Physical Exam: Vitals:   01/22/19 1101  BP: 109/62  Pulse: (!) 120  Resp: 16  Temp: 98.3 F (36.8 C)  TempSrc: Oral  SpO2: 100%     General exam: Moderately built and nourished patient, lying comfortably supine on the gurney in no obvious distress.  Head, eyes and ENT: Nontraumatic and normocephalic. Pupils equally reacting to light and accommodation. Oral mucosa moist.  Neck: Supple. No JVD, carotid bruit or thyromegaly.  Lymphatics: No lymphadenopathy.  Respiratory system: Clear to auscultation. No increased work of breathing.  Cardiovascular system: S1 and S2 heard, RRR. No JVD, murmurs, gallops, clicks or pedal edema.  Gastrointestinal system: Abdomen is nondistended, soft and nontender. Normal bowel sounds heard. No organomegaly or masses appreciated.  Central nervous system: Alert and oriented. No focal neurological deficits.  Extremities: Symmetric 5 x 5 power. Peripheral pulses symmetrically felt.    Skin: No rashes or acute findings.  Musculoskeletal system: Negative exam.  Psychiatry: Pleasant and cooperative.   Labs on Admission:  Basic Metabolic Panel: Recent Labs  Lab 01/22/19 1131  NA 132*  K 3.2*  CL 95*  CO2 24  GLUCOSE 172*  BUN 54*  CREATININE 1.10*  CALCIUM 8.8*   Liver Function Tests: Recent Labs  Lab 01/22/19 1131  AST 19  ALT 18  ALKPHOS 43  BILITOT 0.5  PROT 6.9  ALBUMIN 4.3   Recent Labs  Lab 01/22/19 1131  LIPASE 26   No results for input(s): AMMONIA in the last 168 hours. CBC: Recent Labs  Lab 01/22/19 1131  WBC 9.6  HGB 9.0*  HCT 28.2*  MCV 96.6  PLT 261   Cardiac Enzymes: No results for input(s): CKTOTAL, CKMB, CKMBINDEX, TROPONINI in the last 168 hours.  BNP (last 3 results) No results for input(s): PROBNP  in the last 8760 hours. CBG: No results for input(s): GLUCAP in the last 168 hours.  Radiological Exams on Admission: No results found.  EKG: Will order EKG.   Assessment/Plan Active Problems:   Atypical facial pain   COPD mixed type (HCC)   GI bleed   Hypertension   Hyperlipidemia   Stroke ([place])   Anemia due to acute blood loss  1-Anemia, acute blood loss; -Patient presented with a hemoglobin at 9.  Prior records to 3 years ago hemoglobin was normal. -Patient presented with coffee-ground emesis and melena. -Hb currently at 9, will continue to monitor.  No blood transfusion indicated at this time.  If hemoglobin continues to drop and patient is continued to have melena she might require blood transfusion.  2-coffee-ground emesis, melena: Patient reports ibuprofen intake and alcohol use. Continue with Protonix drip. IV fluids. GI has been consulted.  3-Alcohol use: Monitor on CIWA protocol.  Start thiamine and folic acid.  4-Typical facial pain: Continue with gabapentin 900 mg 3 times daily. Fentanyl IV as needed in case that patient is not able to tolerate oral.  5-history of stroke: Hold aspirin in the  setting of GI bleed.  6-hypertension: Hold diuretics in the setting of GI bleed and AKI.  7-AKI; creatinine 3 years ago 0.7. Patient presented with a creatinine at 1.1 and BUN at 54. Continue with IV fluids. 8-hypokalemia: Replete IV. 9-dehydration, hyponatremia: Continue with IV fluids. 10-SARS Coronavirus screening; pending. I used N 95, face shield, gloves for patient encounter.   DVT Prophylaxis:SCD.  Code Status: Full code Family Communication: Care discussed with daughter who was at bedside who works for [employer] Disposition Plan: Admit under observation for IV fluids, monitor hemoglobin further evaluation for acute blood loss anemia.  Time spent: 75 minutes.   Elmarie Shiley MD [facility]   01/22/2019, 12:38 PM

## 2019-01-22 NOTE — Telephone Encounter (Signed)
Requested Prescriptions   Pending Prescriptions Disp Refills  . gabapentin (NEURONTIN) 600 MG tablet 270 tablet 0    Sig: Take 1 tablet with 1 300mg  tablet three times daily   Verbal from dr. Tomi Likens to refill for 1 month patient last seen 1 year ago she will need to R/S for additional refills

## 2019-01-22 NOTE — Telephone Encounter (Signed)
Patient's daughter called to cancel her mothers appointment for tomorrow due to her having to take her to the ER. She said that her mom did need a refill on her Gabapentin. She uses [facility]. Thanks

## 2019-01-22 NOTE — H&P (View-Only) (Signed)
[facility] Consultation Note  Referring Provider: [facility] Primary Care Physician:  [name], MD  Reason for Consultation:  Gi bleeding  HPI: Brittany Oliver is a 78 y.o. female we've been asked to see for GI bleeding.  Starting yesterday, patient had some generalized abdominal discomfort, bloody diarrhea becoming more melenic, and nausea/vomiting with possible coffee ground component.  Mild abdominal pain.  Takes NSAIDs several per day for years for facial neuralgias.  Last colonoscopy 2007 showed some hyperplastic polyps.  No prior endoscopy.     Past Medical History:  Diagnosis Date  . Arthritis   . Cancer ([place]) 02/2013   skin melanoma  . Heart murmur   . Hyperlipidemia   . Hypertension   . Neuralgia facialis vera   . Stroke [facility]) 1987   has a little balance issue since stroke    Past Surgical History:  Procedure Laterality Date  . ABDOMINAL HYSTERECTOMY  1974   pt still has her uterus, took only 1 ovary and 1 tube  . APPENDECTOMY    . COLONOSCOPY    . DILATION AND CURETTAGE OF UTERUS  1965  . EXCISION MELANOMA WITH SENTINEL LYMPH NODE BIOPSY Left 03/27/2013   Procedure: EXCISION MELANOMA LEFT POSTERIOR ARM WITH LEFT AXILLARY SENTINEL LYMPH NODE BIOPSY;  Surgeon: Zenovia Jarred, MD;  Location: [place];  Service: General;  Laterality: Left;  . EYE SURGERY Bilateral 2006   cataract and laser surgery  . FINGER SURGERY Right    middle finger  . MASS EXCISION Left 09/15/2013   Procedure: EXCISION MASS LEFT UPPER ARM;  Surgeon: Zenovia Jarred, MD;  Location: [place];  Service: General;  Laterality: Left;  . TONSILLECTOMY      Prior to Admission medications   Medication Sig Start Date End Date Taking? Authorizing Provider  aspirin 81 MG chewable tablet Chew 81 mg by mouth daily.   Yes [provider]  Calcium Carbonate-Vitamin D (CALCIUM 600/VITAMIN D) 600-400 MG-UNIT chew tablet Chew 1 tablet by mouth daily.   Yes [provider]   FLECTOR 1.3 % PTCH Place 1 patch onto the skin daily.  11/23/14  Yes [provider]  Fluticasone-Umeclidin-Vilant (TRELEGY ELLIPTA) 100-62.5-25 MCG/INH AEPB Inhale into the lungs.   Yes [provider]  gabapentin (NEURONTIN) 300 MG capsule Take 1 capsule (300 mg total) by mouth 3 (three) times daily. 04/17/18  Yes Tomi Likens, Adam R, DO  gabapentin (NEURONTIN) 600 MG tablet Take 1 tablet with 1 300mg  tablet three times daily 01/22/19  Yes Jaffe, Adam R, DO  ibuprofen (ADVIL) 200 MG tablet Take 800 mg by mouth every 6 (six) hours as needed for fever, headache or moderate pain.   Yes [provider]  ondansetron (ZOFRAN) 8 MG tablet Take 8 mg by mouth once.   Yes [provider]  polyvinyl alcohol (LIQUIFILM TEARS) 1.4 % ophthalmic solution Place 1 drop into both eyes as needed for dry eyes.   Yes [provider]  pravastatin (PRAVACHOL) 80 MG tablet Take 80 mg by mouth daily.   Yes [provider]  valsartan-hydrochlorothiazide (DIOVAN-HCT) 320-12.5 MG tablet Take 1 tablet by mouth daily. 11/22/18  Yes [provider]  vitamin C (ASCORBIC ACID) 500 MG tablet Take 500 mg by mouth daily.   Yes [provider]    Current Facility-Administered Medications  Medication Dose Route Frequency Provider Last Rate Last Dose  . fentaNYL (SUBLIMAZE) injection 12.5 mcg  12.5 mcg Intravenous Q2H PRN Regalado, Cassie Freer, MD  12.5 mcg at 01/22/19 1451  . pantoprazole (PROTONIX) 80 mg in sodium chloride 0.9 % 250 mL (0.32 mg/mL) infusion  8 mg/hr Intravenous Continuous Regalado, Belkys A, MD      . Derrill Memo ON 01/25/2019] pantoprazole (PROTONIX) injection 40 mg  40 mg Intravenous Q12H Regalado, Belkys A, MD      . potassium chloride 10 mEq in 100 mL IVPB  10 mEq Intravenous Q1 Hr x 3 Regalado, Belkys A, MD 90 mL/hr at 01/22/19 1519 10 mEq at 01/22/19 1519   Current Outpatient Medications  Medication Sig Dispense Refill  . aspirin 81 MG chewable tablet  Chew 81 mg by mouth daily.    . Calcium Carbonate-Vitamin D (CALCIUM 600/VITAMIN D) 600-400 MG-UNIT chew tablet Chew 1 tablet by mouth daily.    Marland Kitchen FLECTOR 1.3 % PTCH Place 1 patch onto the skin daily.     . Fluticasone-Umeclidin-Vilant (TRELEGY ELLIPTA) 100-62.5-25 MCG/INH AEPB Inhale into the lungs.    . gabapentin (NEURONTIN) 300 MG capsule Take 1 capsule (300 mg total) by mouth 3 (three) times daily. 270 capsule 3  . gabapentin (NEURONTIN) 600 MG tablet Take 1 tablet with 1 300mg  tablet three times daily 270 tablet 0  . ibuprofen (ADVIL) 200 MG tablet Take 800 mg by mouth every 6 (six) hours as needed for fever, headache or moderate pain.    Marland Kitchen ondansetron (ZOFRAN) 8 MG tablet Take 8 mg by mouth once.    . polyvinyl alcohol (LIQUIFILM TEARS) 1.4 % ophthalmic solution Place 1 drop into both eyes as needed for dry eyes.    . pravastatin (PRAVACHOL) 80 MG tablet Take 80 mg by mouth daily.    . valsartan-hydrochlorothiazide (DIOVAN-HCT) 320-12.5 MG tablet Take 1 tablet by mouth daily.    . vitamin C (ASCORBIC ACID) 500 MG tablet Take 500 mg by mouth daily.      Allergies as of 01/22/2019  . (No Known Allergies)    Family History  Adopted: Yes  Problem Relation Age of Onset  . Ataxia Neg Hx   . Chorea Neg Hx   . Dementia Neg Hx   . Mental retardation Neg Hx   . Migraines Neg Hx   . Multiple sclerosis Neg Hx   . Neurofibromatosis Neg Hx   . Neuropathy Neg Hx   . Parkinsonism Neg Hx   . Seizures Neg Hx   . Stroke Neg Hx     Social History   Socioeconomic History  . Marital status: Married    Spouse name: Not on file  . Number of children: Not on file  . Years of education: Not on file  . Highest education level: Not on file  Occupational History  . Not on file  Social Needs  . Financial resource strain: Not on file  . Food insecurity    Worry: Not on file    Inability: Not on file  . Transportation needs    Medical: Not on file    Non-medical: Not on file  Tobacco Use   . Smoking status: Former Smoker    Packs/day: 0.50    Years: 43.20    Pack years: 21.60    Types: Cigarettes    Quit date: 03/23/2005    Years since quitting: 13.8  . Smokeless tobacco: Never Used  . Tobacco comment: QUIT 9 YEARS AGO  Substance and Sexual Activity  . Alcohol use: Yes    Alcohol/week: 5.0 standard drinks    Types: 5 Glasses of wine per week  .  Drug use: No  . Sexual activity: Yes    Partners: Male  Lifestyle  . Physical activity    Days per week: Not on file    Minutes per session: Not on file  . Stress: Not on file  Relationships  . Social Herbalist on phone: Not on file    Gets together: Not on file    Attends religious service: Not on file    Active member of club or organization: Not on file    Attends meetings of clubs or organizations: Not on file    Relationship status: Not on file  . Intimate partner violence    Fear of current or ex partner: Not on file    Emotionally abused: Not on file    Physically abused: Not on file    Forced sexual activity: Not on file  Other Topics Concern  . Not on file  Social History Narrative  . Not on file    Review of Systems: As per HPI, all others negative  Physical Exam: Vital signs in last 24 hours: Temp:  [98.3 F (36.8 C)] 98.3 F (36.8 C) (11/05 1101) Pulse Rate:  [120-159] 159 (11/05 1516) Resp:  [16-24] 24 (11/05 1516) BP: (109-117)/(62-67) 117/67 (11/05 1516) SpO2:  [88 %-100 %] 88 % (11/05 1516)   General:   Alert,  Well-developed, well-nourished, pleasant and cooperative in NAD Head:  Normocephalic and atraumatic. Eyes:  Sclera clear, no icterus.   Conjunctiva pink. Ears:  Normal auditory acuity. Nose:  No deformity, discharge,  or lesions. Mouth:  No deformity or lesions.  Oropharynx pink & moist. Neck:  Supple; no masses or thyromegaly. Lungs:  Clear throughout to auscultation.   No wheezes, crackles, or rhonchi. No acute distress. Heart:  Regular rate and rhythm; no murmurs,  clicks, rubs,  or gallops. Abdomen:  Soft, mild protuberant, mild epigastric tenderness without peritonitis. No masses, hepatosplenomegaly or hernias noted. Normal bowel sounds, without guarding, and without rebound.     Msk:  Symmetrical without gross deformities. Normal posture. Pulses:  Normal pulses noted. Extremities:  Without clubbing or edema. Neurologic:  Alert and  oriented x4;  grossly normal neurologically. Skin:  Intact without significant lesions or rashes. Cervical Nodes:  No significant cervical adenopathy. Psych:  Alert and cooperative. Normal mood and affect.   Lab Results: Recent Labs    01/22/19 1131  WBC 9.6  HGB 9.0*  HCT 28.2*  PLT 261   BMET Recent Labs    01/22/19 1131  NA 132*  K 3.2*  CL 95*  CO2 24  GLUCOSE 172*  BUN 54*  CREATININE 1.10*  CALCIUM 8.8*   LFT Recent Labs    01/22/19 1131  PROT 6.9  ALBUMIN 4.3  AST 19  ALT 18  ALKPHOS 43  BILITOT 0.5   PT/INR No results for input(s): LABPROT, INR in the last 72 hours.  Studies/Results: No results found.  Impression:  1.  Nausea, vomiting, coffee ground emesis. 2.  Melena with elevated BUN.  Overall suspect upper GI bleeding. 3.  Acute blood loss anemia.  Plan:  1.  Clear liquid diet, NPO after midnight. 2.  PPI. 3.  Volume repletion, serial CBCs, transfuse as needed. 4.  Endoscopy tomorrow for further evaluation. 5.  Risks (bleeding, infection, bowel perforation that could require surgery, sedation-related changes in cardiopulmonary systems), benefits (identification and possible treatment of source of symptoms, exclusion of certain causes of symptoms), and alternatives (watchful waiting, radiographic imaging studies, empiric  medical treatment) of upper endoscopy (EGD) were explained to patient/family in detail and patient wishes to proceed. 6.  Next step in management pending endoscopy findings. 7.  [facility] will follow.   LOS: 0 days   Hendy Brindle M  01/22/2019, 3:56  PM  Cell [phone] If no answer or after 5 PM call [phone]

## 2019-01-22 NOTE — ED Provider Notes (Signed)
[facility] DEPT Provider Note   CSN: [id number] Arrival date & time: 01/22/19  1054     History   Chief Complaint Chief Complaint  Patient presents with  . Emesis  . Diarrhea    HPI Brittany Oliver is a 78 y.o. female.     HPI   78 year old female with vomiting, diarrhea and abdominal pain.  Onset yesterday.  She reports that both her emesis and stool has been very dark in color.  Diffuse, crampy abdominal pain.  Fairly constant.  No fevers or chills.  No urinary complaints.  Denies past history of GI bleed.  Not anticoagulated.  Past Medical History:  Diagnosis Date  . Arthritis   . Cancer ([place]) 02/2013   skin melanoma  . Heart murmur   . Hyperlipidemia   . Hypertension   . Neuralgia facialis vera   . Stroke [facility]) 1987   has a little balance issue since stroke    Patient Active Problem List   Diagnosis Date Noted  . COPD mixed type ([place]) 03/02/2015  . Dyspnea 12/23/2014  . Ex-cigarette smoker 12/23/2014  . Seroma complicating a procedure 09/24/2013  . Mass of arm 08/26/2013  . Atypical facial pain 07/13/2013  . Melanoma of left upper arm ([place]) 03/23/2013    Past Surgical History:  Procedure Laterality Date  . ABDOMINAL HYSTERECTOMY  1974   pt still has her uterus, took only 1 ovary and 1 tube  . APPENDECTOMY    . COLONOSCOPY    . DILATION AND CURETTAGE OF UTERUS  1965  . EXCISION MELANOMA WITH SENTINEL LYMPH NODE BIOPSY Left 03/27/2013   Procedure: EXCISION MELANOMA LEFT POSTERIOR ARM WITH LEFT AXILLARY SENTINEL LYMPH NODE BIOPSY;  Surgeon: Zenovia Jarred, MD;  Location: [place];  Service: General;  Laterality: Left;  . EYE SURGERY Bilateral 2006   cataract and laser surgery  . FINGER SURGERY Right    middle finger  . MASS EXCISION Left 09/15/2013   Procedure: EXCISION MASS LEFT UPPER ARM;  Surgeon: Zenovia Jarred, MD;  Location: [place];  Service: General;  Laterality: Left;  . TONSILLECTOMY       OB History   No  obstetric history on file.      Home Medications    Prior to Admission medications   Medication Sig Start Date End Date Taking? Authorizing Provider  Calcium Citrate-Vitamin D (CALCIUM + D PO) Take 1 capsule by mouth daily.    [provider]  FLECTOR 1.3 % [facility]  11/23/14   [provider]  Fluticasone-Umeclidin-Vilant (TRELEGY ELLIPTA) 100-62.5-25 MCG/INH AEPB Inhale into the lungs.    [provider]  gabapentin (NEURONTIN) 300 MG capsule Take 1 capsule (300 mg total) by mouth 3 (three) times daily. 04/17/18   Pieter Partridge, DO  gabapentin (NEURONTIN) 600 MG tablet Take 1 tablet with 1 300mg  tablet three times daily 01/22/19   Pieter Partridge, DO  losartan-hydrochlorothiazide [facility]) 100-12.5 MG tablet  02/14/15   [provider]  pravastatin (PRAVACHOL) 80 MG tablet Take 80 mg by mouth daily.    [provider]  SYMBICORT 160-4.5 MCG/ACT inhaler Inhale 2 puffs into the lungs 2 (two) times daily. Patient not taking: Reported on 02/15/2017 08/31/15   Noralee Space, MD  tiotropium (SPIRIVA HANDIHALER) 18 MCG inhalation capsule Place 1 capsule (18 mcg total) into inhaler and inhale daily. Patient not taking: Reported on 02/15/2017 08/31/15   Noralee Space, MD  valsartan-hydrochlorothiazide (DIOVAN-HCT) 320-12.5 MG  tablet Take 1 tablet by mouth daily. 11/22/18   [provider]  vitamin C (ASCORBIC ACID) 500 MG tablet Take 500 mg by mouth daily.    [provider]    Family History Family History  Adopted: Yes  Problem Relation Age of Onset  . Ataxia Neg Hx   . Chorea Neg Hx   . Dementia Neg Hx   . Mental retardation Neg Hx   . Migraines Neg Hx   . Multiple sclerosis Neg Hx   . Neurofibromatosis Neg Hx   . Neuropathy Neg Hx   . Parkinsonism Neg Hx   . Seizures Neg Hx   . Stroke Neg Hx     Social History Social History   Tobacco Use  . Smoking status: Former Smoker    Packs/day: 0.50    Years: 43.20    Pack years:  21.60    Types: Cigarettes    Quit date: 03/23/2005    Years since quitting: 13.8  . Smokeless tobacco: Never Used  . Tobacco comment: QUIT 9 YEARS AGO  Substance Use Topics  . Alcohol use: Yes    Alcohol/week: 5.0 standard drinks    Types: 5 Glasses of wine per week  . Drug use: No     Allergies   Patient has no known allergies.   Review of Systems Review of Systems  All systems reviewed and negative, other than as noted in HPI.  Physical Exam Updated Vital Signs BP 109/62 (BP Location: Right Arm)   Pulse (!) 120   Temp 98.3 F (36.8 C) (Oral)   Resp 16   SpO2 100%   Physical Exam Vitals signs and nursing note reviewed.  Constitutional:      General: She is not in acute distress.    Appearance: She is well-developed.  HENT:     Head: Normocephalic and atraumatic.  Eyes:     General:        Right eye: No discharge.        Left eye: No discharge.     Conjunctiva/sclera: Conjunctivae normal.  Neck:     Musculoskeletal: Neck supple.  Cardiovascular:     Rate and Rhythm: Regular rhythm. Tachycardia present.     Heart sounds: Normal heart sounds. No murmur. No friction rub. No gallop.   Pulmonary:     Effort: Pulmonary effort is normal. No respiratory distress.     Breath sounds: Normal breath sounds.  Abdominal:     General: There is no distension.     Palpations: Abdomen is soft.     Tenderness: There is abdominal tenderness.     Comments: Mild epigastric tenderness without rebound or guarding.  Musculoskeletal:        General: No tenderness.  Skin:    General: Skin is warm and dry.  Neurological:     Mental Status: She is alert.  Psychiatric:        Behavior: Behavior normal.        Thought Content: Thought content normal.      ED Treatments / Results  Labs (all labs ordered are listed, but only abnormal results are displayed) Labs Reviewed  COMPREHENSIVE METABOLIC PANEL - Abnormal; Notable for the following components:      Result Value    Sodium 132 (*)    Potassium 3.2 (*)    Chloride 95 (*)    Glucose, Bld 172 (*)    BUN 54 (*)    Creatinine, Ser 1.10 (*)    Calcium  8.8 (*)    GFR calc non Af Amer 48 (*)    GFR calc Af Amer 56 (*)    All other components within normal limits  CBC - Abnormal; Notable for the following components:   RBC 2.92 (*)    Hemoglobin 9.0 (*)    HCT 28.2 (*)    All other components within normal limits  URINALYSIS, ROUTINE W REFLEX MICROSCOPIC - Abnormal; Notable for the following components:   Hgb urine dipstick MODERATE (*)    All other components within normal limits  HEMOGLOBIN AND HEMATOCRIT, BLOOD - Abnormal; Notable for the following components:   Hemoglobin 7.2 (*)    HCT 23.1 (*)    All other components within normal limits  COMPREHENSIVE METABOLIC PANEL - Abnormal; Notable for the following components:   Glucose, Bld 118 (*)    BUN 27 (*)    Calcium 8.0 (*)    Total Protein 5.2 (*)    Albumin 3.1 (*)    AST 12 (*)    Alkaline Phosphatase 33 (*)    All other components within normal limits  CBC - Abnormal; Notable for the following components:   RBC 2.61 (*)    Hemoglobin 8.1 (*)    HCT 25.2 (*)    Platelets 128 (*)    All other components within normal limits  COMPREHENSIVE METABOLIC PANEL - Abnormal; Notable for the following components:   Glucose, Bld 112 (*)    BUN 42 (*)    Calcium 8.5 (*)    Total Protein 5.8 (*)    Alkaline Phosphatase 35 (*)    All other components within normal limits  POC OCCULT BLOOD, ED - Abnormal; Notable for the following components:   Fecal Occult Bld POSITIVE (*)    All other components within normal limits  SARS CORONAVIRUS 2 (TAT 6-24 HRS)  LIPASE, BLOOD  OCCULT BLOOD X 1 CARD TO LAB, STOOL  HEMOGLOBIN AND HEMATOCRIT, BLOOD  HEMOGLOBIN AND HEMATOCRIT, BLOOD  TYPE AND SCREEN  ABO/RH  PREPARE RBC (CROSSMATCH)  PREPARE RBC (CROSSMATCH)    EKG None  Radiology No results found.  Procedures Procedures (including critical  care time)  Medications Ordered in ED Medications  pantoprazole (PROTONIX) 80 mg in sodium chloride 0.9 % 100 mL IVPB (has no administration in time range)  lactated ringers bolus 1,000 mL (1,000 mLs Intravenous New Bag/Given 01/22/19 1157)  promethazine (PHENERGAN) injection 12.5 mg (12.5 mg Intravenous Given 01/22/19 1158)     Initial Impression / Assessment and Plan / ED Course  I have reviewed the triage vital signs and the nursing notes.  Pertinent labs & imaging results that were available during my care of the patient were reviewed by me and considered in my medical decision making (see chart for details).        78 year old female with vomiting and diarrhea.  Reports very dark in color.  Stool is heme positive.  Presumably new anemia.  Normal on prior labs, although from several years ago.  She is not anticoagulated.  BUN is 51 with a creatinine of 1.1.  She is mildly tender in the epigastrium.  Likely upper GI bleed.  She is started on a PPI.  Type and screen.  IV fluids.  GI consultation.  Medicine admission.  Final Clinical Impressions(s) / ED Diagnoses   Final diagnoses:  UGIB (upper gastrointestinal bleed)    ED Discharge Orders    None       Virgel Manifold, MD 01/23/19 [phone]

## 2019-01-23 ENCOUNTER — Inpatient Hospital Stay (HOSPITAL_COMMUNITY): Payer: Medicare Other

## 2019-01-23 ENCOUNTER — Ambulatory Visit: Payer: Medicare Other | Admitting: Neurology

## 2019-01-23 ENCOUNTER — Observation Stay (HOSPITAL_COMMUNITY): Payer: Medicare Other | Admitting: Certified Registered"

## 2019-01-23 ENCOUNTER — Encounter (HOSPITAL_COMMUNITY)
Admission: EM | Disposition: A | Discharge status: Home / Self Care | Payer: Self-pay | Source: Home / Self Care | Attending: Internal Medicine

## 2019-01-23 ENCOUNTER — Encounter (HOSPITAL_COMMUNITY): Payer: Self-pay | Admitting: *Deleted

## 2019-01-23 DIAGNOSIS — B0221 Postherpetic geniculate ganglionitis: Secondary | ICD-10-CM

## 2019-01-23 DIAGNOSIS — E860 Dehydration: Secondary | ICD-10-CM | POA: Diagnosis present

## 2019-01-23 DIAGNOSIS — R001 Bradycardia, unspecified: Secondary | ICD-10-CM | POA: Diagnosis not present

## 2019-01-23 DIAGNOSIS — I959 Hypotension, unspecified: Secondary | ICD-10-CM | POA: Diagnosis not present

## 2019-01-23 DIAGNOSIS — I110 Hypertensive heart disease with heart failure: Secondary | ICD-10-CM | POA: Diagnosis not present

## 2019-01-23 DIAGNOSIS — I480 Paroxysmal atrial fibrillation: Secondary | ICD-10-CM | POA: Diagnosis not present

## 2019-01-23 DIAGNOSIS — D62 Acute posthemorrhagic anemia: Secondary | ICD-10-CM

## 2019-01-23 DIAGNOSIS — E785 Hyperlipidemia, unspecified: Secondary | ICD-10-CM | POA: Diagnosis not present

## 2019-01-23 DIAGNOSIS — I10 Essential (primary) hypertension: Secondary | ICD-10-CM

## 2019-01-23 DIAGNOSIS — J9601 Acute respiratory failure with hypoxia: Secondary | ICD-10-CM | POA: Diagnosis not present

## 2019-01-23 DIAGNOSIS — R0600 Dyspnea, unspecified: Secondary | ICD-10-CM | POA: Diagnosis not present

## 2019-01-23 DIAGNOSIS — J441 Chronic obstructive pulmonary disease with (acute) exacerbation: Secondary | ICD-10-CM | POA: Diagnosis not present

## 2019-01-23 DIAGNOSIS — Z90710 Acquired absence of both cervix and uterus: Secondary | ICD-10-CM | POA: Diagnosis not present

## 2019-01-23 DIAGNOSIS — K449 Diaphragmatic hernia without obstruction or gangrene: Secondary | ICD-10-CM | POA: Diagnosis present

## 2019-01-23 DIAGNOSIS — J440 Chronic obstructive pulmonary disease with acute lower respiratory infection: Secondary | ICD-10-CM | POA: Diagnosis not present

## 2019-01-23 DIAGNOSIS — K920 Hematemesis: Secondary | ICD-10-CM | POA: Diagnosis not present

## 2019-01-23 DIAGNOSIS — I4891 Unspecified atrial fibrillation: Secondary | ICD-10-CM | POA: Diagnosis not present

## 2019-01-23 DIAGNOSIS — Z8673 Personal history of transient ischemic attack (TIA), and cerebral infarction without residual deficits: Secondary | ICD-10-CM | POA: Diagnosis not present

## 2019-01-23 DIAGNOSIS — I639 Cerebral infarction, unspecified: Secondary | ICD-10-CM | POA: Diagnosis not present

## 2019-01-23 DIAGNOSIS — D649 Anemia, unspecified: Secondary | ICD-10-CM | POA: Diagnosis not present

## 2019-01-23 DIAGNOSIS — Z85820 Personal history of malignant melanoma of skin: Secondary | ICD-10-CM | POA: Diagnosis not present

## 2019-01-23 DIAGNOSIS — Z20828 Contact with and (suspected) exposure to other viral communicable diseases: Secondary | ICD-10-CM | POA: Diagnosis not present

## 2019-01-23 DIAGNOSIS — G501 Atypical facial pain: Secondary | ICD-10-CM | POA: Diagnosis not present

## 2019-01-23 DIAGNOSIS — J410 Simple chronic bronchitis: Secondary | ICD-10-CM | POA: Diagnosis not present

## 2019-01-23 DIAGNOSIS — K921 Melena: Secondary | ICD-10-CM | POA: Diagnosis not present

## 2019-01-23 DIAGNOSIS — A419 Sepsis, unspecified organism: Secondary | ICD-10-CM | POA: Diagnosis not present

## 2019-01-23 DIAGNOSIS — Z87891 Personal history of nicotine dependence: Secondary | ICD-10-CM | POA: Diagnosis not present

## 2019-01-23 DIAGNOSIS — E876 Hypokalemia: Secondary | ICD-10-CM | POA: Diagnosis present

## 2019-01-23 DIAGNOSIS — E0590 Thyrotoxicosis, unspecified without thyrotoxic crisis or storm: Secondary | ICD-10-CM | POA: Diagnosis present

## 2019-01-23 DIAGNOSIS — R509 Fever, unspecified: Secondary | ICD-10-CM | POA: Diagnosis not present

## 2019-01-23 DIAGNOSIS — J449 Chronic obstructive pulmonary disease, unspecified: Secondary | ICD-10-CM

## 2019-01-23 DIAGNOSIS — E871 Hypo-osmolality and hyponatremia: Secondary | ICD-10-CM | POA: Diagnosis not present

## 2019-01-23 DIAGNOSIS — K254 Chronic or unspecified gastric ulcer with hemorrhage: Secondary | ICD-10-CM | POA: Diagnosis not present

## 2019-01-23 DIAGNOSIS — R0602 Shortness of breath: Secondary | ICD-10-CM | POA: Diagnosis not present

## 2019-01-23 DIAGNOSIS — K922 Gastrointestinal hemorrhage, unspecified: Secondary | ICD-10-CM | POA: Diagnosis not present

## 2019-01-23 DIAGNOSIS — I5033 Acute on chronic diastolic (congestive) heart failure: Secondary | ICD-10-CM | POA: Diagnosis not present

## 2019-01-23 DIAGNOSIS — N179 Acute kidney failure, unspecified: Secondary | ICD-10-CM | POA: Diagnosis not present

## 2019-01-23 DIAGNOSIS — J810 Acute pulmonary edema: Secondary | ICD-10-CM | POA: Diagnosis not present

## 2019-01-23 DIAGNOSIS — J690 Pneumonitis due to inhalation of food and vomit: Secondary | ICD-10-CM | POA: Diagnosis not present

## 2019-01-23 DIAGNOSIS — R140 Abdominal distension (gaseous): Secondary | ICD-10-CM | POA: Diagnosis not present

## 2019-01-23 HISTORY — PX: ESOPHAGOGASTRODUODENOSCOPY (EGD) WITH PROPOFOL: SHX5813

## 2019-01-23 LAB — COMPREHENSIVE METABOLIC PANEL
ALT: 11 U/L (ref 0–44)
AST: 12 U/L — ABNORMAL LOW (ref 15–41)
Albumin: 3.1 g/dL — ABNORMAL LOW (ref 3.5–5.0)
Alkaline Phosphatase: 33 U/L — ABNORMAL LOW (ref 38–126)
Anion gap: 6 (ref 5–15)
BUN: 27 mg/dL — ABNORMAL HIGH (ref 8–23)
CO2: 25 mmol/L (ref 22–32)
Calcium: 8 mg/dL — ABNORMAL LOW (ref 8.9–10.3)
Chloride: 110 mmol/L (ref 98–111)
Creatinine, Ser: 0.81 mg/dL (ref 0.44–1.00)
GFR calc Af Amer: >60 mL/min (ref >60)
GFR calc non Af Amer: >60 mL/min (ref >60)
Glucose, Bld: 118 mg/dL — ABNORMAL HIGH (ref 70–99)
Potassium: 3.8 mmol/L (ref 3.5–5.1)
Sodium: 141 mmol/L (ref 135–145)
Total Bilirubin: 0.9 mg/dL (ref 0.3–1.2)
Total Protein: 5.2 g/dL — ABNORMAL LOW (ref 6.5–8.1)

## 2019-01-23 LAB — HEMOGLOBIN AND HEMATOCRIT, BLOOD
HCT: 28.2 % — ABNORMAL LOW (ref 36.0–46.0)
HCT: 24.5 % — ABNORMAL LOW (ref 36.0–46.0)
Hemoglobin: 8.7 g/dL — ABNORMAL LOW (ref 12.0–15.0)
Hemoglobin: 7.7 g/dL — ABNORMAL LOW (ref 12.0–15.0)

## 2019-01-23 LAB — BASIC METABOLIC PANEL
Anion gap: 7 (ref 5–15)
BUN: 18 mg/dL (ref 8–23)
CO2: 24 mmol/L (ref 22–32)
Calcium: 8 mg/dL — ABNORMAL LOW (ref 8.9–10.3)
Chloride: 108 mmol/L (ref 98–111)
Creatinine, Ser: 0.71 mg/dL (ref 0.44–1.00)
GFR calc Af Amer: >60 mL/min (ref >60)
GFR calc non Af Amer: >60 mL/min (ref >60)
Glucose, Bld: 127 mg/dL — ABNORMAL HIGH (ref 70–99)
Potassium: 3.2 mmol/L — ABNORMAL LOW (ref 3.5–5.1)
Sodium: 139 mmol/L (ref 135–145)

## 2019-01-23 LAB — URINALYSIS, ROUTINE W REFLEX MICROSCOPIC
Bilirubin Urine: NEGATIVE
Glucose, UA: NEGATIVE mg/dL
Hgb urine dipstick: NEGATIVE
Ketones, ur: NEGATIVE mg/dL
Nitrite: NEGATIVE
Protein, ur: 30 mg/dL — AB
Specific Gravity, Urine: 1.017 (ref 1.005–1.030)
pH: 6 (ref 5.0–8.0)

## 2019-01-23 LAB — CBC
HCT: 25.2 % — ABNORMAL LOW (ref 36.0–46.0)
HCT: 26.9 % — ABNORMAL LOW (ref 36.0–46.0)
Hemoglobin: 8.1 g/dL — ABNORMAL LOW (ref 12.0–15.0)
Hemoglobin: 8.4 g/dL — ABNORMAL LOW (ref 12.0–15.0)
MCH: 31 pg (ref 26.0–34.0)
MCH: 30.5 pg (ref 26.0–34.0)
MCHC: 32.1 g/dL (ref 30.0–36.0)
MCHC: 31.2 g/dL (ref 30.0–36.0)
MCV: 96.6 fL (ref 80.0–100.0)
MCV: 97.8 fL (ref 80.0–100.0)
Platelets: 128 10*3/uL — ABNORMAL LOW (ref 150–400)
Platelets: 135 10*3/uL — ABNORMAL LOW (ref 150–400)
RBC: 2.61 MIL/uL — ABNORMAL LOW (ref 3.87–5.11)
RBC: 2.75 MIL/uL — ABNORMAL LOW (ref 3.87–5.11)
RDW: 14.6 % (ref 11.5–15.5)
RDW: 15 % (ref 11.5–15.5)
WBC: 4.8 10*3/uL (ref 4.0–10.5)
WBC: 7.1 10*3/uL (ref 4.0–10.5)
nRBC: 0 % (ref 0.0–0.2)
nRBC: 0 % (ref 0.0–0.2)

## 2019-01-23 LAB — PHOSPHORUS: Phosphorus: 1.7 mg/dL — ABNORMAL LOW (ref 2.5–4.6)

## 2019-01-23 LAB — PREPARE RBC (CROSSMATCH)

## 2019-01-23 LAB — LACTIC ACID, PLASMA
Lactic Acid, Venous: 1 mmol/L (ref 0.5–1.9)
Lactic Acid, Venous: 1.1 mmol/L (ref 0.5–1.9)
Lactic Acid, Venous: 1.1 mmol/L (ref 0.5–1.9)

## 2019-01-23 LAB — TROPONIN I (HIGH SENSITIVITY): Troponin I (High Sensitivity): 14 ng/L (ref <18)

## 2019-01-23 LAB — PROCALCITONIN: Procalcitonin: 0.35 ng/mL

## 2019-01-23 LAB — MAGNESIUM: Magnesium: 1.6 mg/dL — ABNORMAL LOW (ref 1.7–2.4)

## 2019-01-23 SURGERY — ESOPHAGOGASTRODUODENOSCOPY (EGD) WITH PROPOFOL
Anesthesia: Monitor Anesthesia Care | Laterality: Left

## 2019-01-23 MED ORDER — PROPOFOL 500 MG/50ML IV EMUL
INTRAVENOUS | Status: DC | PRN
  Administered 2019-01-23: 135 ug/kg/min via INTRAVENOUS

## 2019-01-23 MED ORDER — PROPOFOL 10 MG/ML IV BOLUS
INTRAVENOUS | Status: DC | PRN
  Administered 2019-01-23: 10 mg via INTRAVENOUS
  Administered 2019-01-23: 20 mg via INTRAVENOUS

## 2019-01-23 MED ORDER — METOPROLOL TARTRATE 25 MG PO TABS: 12.5000 mg | ORAL_TABLET | Freq: Two times a day (BID) | ORAL | Status: DC

## 2019-01-23 MED ORDER — SODIUM PHOSPHATES 45 MMOLE/15ML IV SOLN
30.0000 mmol | Freq: Once | INTRAVENOUS | Status: AC
  Administered 2019-01-23: 30 mmol via INTRAVENOUS
  Filled 2019-01-23: qty 10

## 2019-01-23 MED ORDER — PROPOFOL 10 MG/ML IV BOLUS
INTRAVENOUS | Status: AC
  Filled 2019-01-23: qty 20

## 2019-01-23 MED ORDER — PANTOPRAZOLE SODIUM 40 MG PO TBEC
40.0000 mg | DELAYED_RELEASE_TABLET | Freq: Two times a day (BID) | ORAL | Status: DC
  Administered 2019-01-23 – 2019-01-28 (×10): 40 mg via ORAL
  Filled 2019-01-23 (×10): qty 1

## 2019-01-23 MED ORDER — METOPROLOL TARTRATE 5 MG/5ML IV SOLN
5.0000 mg | Freq: Once | INTRAVENOUS | Status: AC
  Administered 2019-01-23: 5 mg via INTRAVENOUS

## 2019-01-23 MED ORDER — SODIUM CHLORIDE 0.9 % IV BOLUS
500.0000 mL | Freq: Once | INTRAVENOUS | Status: AC
  Administered 2019-01-23: 500 mL via INTRAVENOUS

## 2019-01-23 MED ORDER — METOPROLOL TARTRATE 25 MG PO TABS
12.5000 mg | ORAL_TABLET | Freq: Two times a day (BID) | ORAL | Status: DC
  Administered 2019-01-24: 12.5 mg via ORAL
  Filled 2019-01-23: qty 1

## 2019-01-23 MED ORDER — PROPOFOL 500 MG/50ML IV EMUL
INTRAVENOUS | Status: AC
  Filled 2019-01-23: qty 50

## 2019-01-23 MED ORDER — METOPROLOL TARTRATE 5 MG/5ML IV SOLN
INTRAVENOUS | Status: AC
  Filled 2019-01-23: qty 5

## 2019-01-23 MED ORDER — SODIUM CHLORIDE 0.9 % IV SOLN
3.0000 g | Freq: Once | INTRAVENOUS | Status: AC
  Administered 2019-01-23: 3 g via INTRAVENOUS
  Filled 2019-01-23: qty 3

## 2019-01-23 MED ORDER — POTASSIUM CHLORIDE CRYS ER 20 MEQ PO TBCR
40.0000 meq | EXTENDED_RELEASE_TABLET | ORAL | Status: AC
  Administered 2019-01-23 (×2): 40 meq via ORAL
  Filled 2019-01-23 (×2): qty 2

## 2019-01-23 MED ORDER — MAGNESIUM SULFATE 2 GM/50ML IV SOLN
2.0000 g | Freq: Once | INTRAVENOUS | Status: AC
  Administered 2019-01-23: 2 g via INTRAVENOUS
  Filled 2019-01-23: qty 50

## 2019-01-23 MED ORDER — ACETAMINOPHEN 325 MG PO TABS
325.0000 mg | ORAL_TABLET | Freq: Once | ORAL | Status: AC
  Administered 2019-01-23: 325 mg via ORAL
  Filled 2019-01-23: qty 1

## 2019-01-23 SURGICAL SUPPLY — 15 items

## 2019-01-23 NOTE — Progress Notes (Addendum)
   01/23/19 1804  Vitals  Temp (!) 102.3 F (39.1 C)  Temp Source Oral  BP (!) 150/56  BP Location Left Arm  BP Method Automatic  Patient Position (if appropriate) Lying  Pulse Rate (!) 118  Resp 16  Oxygen Therapy  SpO2 (!) 88 %  O2 Device Room Air  MEWS Score  MEWS RR 0  MEWS Pulse 2  MEWS Systolic 0  MEWS LOC 0  MEWS Temp 2  MEWS Score 4  MEWS Score Color Red  MEWS Assessment  Is this an acute change? Yes    MD made aware of MEWS score.  Order for UA, chest and abdominal x-ray and blood cultures placed.  Red MEWS protocol put in place and will be monitoring vitals frequently.

## 2019-01-23 NOTE — Progress Notes (Signed)
PROGRESS NOTE    Brittany Oliver  [id number]  DOB: November 15, 1940  DOA: 01/22/2019 PCP: [name], MD  Brief Narrative:  78 y.o. female past medical history significant for stroke, hypertension, hyperlipidemia, history of skin melanoma who presents  With c/o nausea vomiting, coffee-ground emesis episodes and also associated with diarrhea/ melena in the setting daily alcohol, ASA and NSAID use. Last colonoscopy 2007 showed some hyperplastic polyps.  No prior endoscopy.   ED course: Sodium 132, potassium 3.2, chloride 95, glucose 172, BUN 54, creatinine 1.1, calcium 8.8, hemoglobin 9.0 (nl 3 yrs back), white blood cell 9.6, platelet 261, fecal occult blood positive. Patient admitted to [facility] with IV PPI drip and GI consult.   Subjective: Patient resting comfortably when seen this morning in rounds.  Daughter at bedside.  Patient states no further vomiting since admitted but had one loose BM with black stools early this morning around 4 AM.  Plan for EGD today  Objective: Vitals:   01/22/19 2355 01/23/19 0340 01/23/19 0602 01/23/19 0809  BP: (!) 117/49 (!) 124/59 (!) 126/53   Pulse: 81 83 79   Resp: 18 18 18    Temp: 98.1 F (36.7 C) 99 F (37.2 C) 98.7 F (37.1 C)   TempSrc: Oral Oral Oral   SpO2: 98% 96% 97% 95%  Weight:      Height:        Intake/Output Summary (Last 24 hours) at 01/23/2019 0845 Last data filed at 01/23/2019 0600 Gross per 24 hour  Intake 3078.79 ml  Output 0 ml  Net 3078.79 ml   Filed Weights   01/22/19 1945  Weight: 58 kg    Physical Examination:  General exam: Appears calm and comfortable  Respiratory system: Clear to auscultation. Respiratory effort normal. Cardiovascular system: S1 & S2 heard, RRR. No JVD, murmurs, rubs, gallops or clicks. No pedal edema. Gastrointestinal system: Abdomen is nondistended, soft and nontender. No organomegaly or masses felt. Normal bowel sounds heard. Central nervous system: Alert and oriented. No focal  neurological deficits. Extremities: Symmetric 5 x 5 power. Skin: No rashes, lesions or ulcers Psychiatry: Judgement and insight appear normal. Mood & affect appropriate.     Data Reviewed: I have personally reviewed following labs and imaging studies  CBC: Recent Labs  Lab 01/22/19 1131 01/22/19 1841 01/23/19 0609  WBC 9.6  --  4.8  HGB 9.0* 7.2* 8.1*  HCT 28.2* 23.1* 25.2*  MCV 96.6  --  96.6  PLT 261  --  0000000*   Basic Metabolic Panel: Recent Labs  Lab 01/22/19 1131 01/22/19 1840 01/23/19 0609  NA 132* 138 141  K 3.2* 4.3 3.8  CL 95* 107 110  CO2 24 24 25   GLUCOSE 172* 112* 118*  BUN 54* 42* 27*  CREATININE 1.10* 0.86 0.81  CALCIUM 8.8* 8.5* 8.0*   GFR: Estimated Creatinine Clearance: 46 mL/min (by C-G formula based on SCr of 0.81 mg/dL). Liver Function Tests: Recent Labs  Lab 01/22/19 1131 01/22/19 1840 01/23/19 0609  AST 19 15 12*  ALT 18 15 11   ALKPHOS 43 35* 33*  BILITOT 0.5 0.7 0.9  PROT 6.9 5.8* 5.2*  ALBUMIN 4.3 3.7 3.1*   Recent Labs  Lab 01/22/19 1131  LIPASE 26   No results for input(s): AMMONIA in the last 168 hours. Coagulation Profile: No results for input(s): INR, PROTIME in the last 168 hours. Cardiac Enzymes: No results for input(s): CKTOTAL, CKMB, CKMBINDEX, TROPONINI in the last 168 hours. BNP (last 3 results) No results for  input(s): PROBNP in the last 8760 hours. HbA1C: No results for input(s): HGBA1C in the last 72 hours. CBG: No results for input(s): GLUCAP in the last 168 hours. Lipid Profile: No results for input(s): CHOL, HDL, LDLCALC, TRIG, CHOLHDL, LDLDIRECT in the last 72 hours. Thyroid Function Tests: No results for input(s): TSH, T4TOTAL, FREET4, T3FREE, THYROIDAB in the last 72 hours. Anemia Panel: No results for input(s): VITAMINB12, FOLATE, FERRITIN, TIBC, IRON, RETICCTPCT in the last 72 hours. Sepsis Labs: No results for input(s): PROCALCITON, LATICACIDVEN in the last 168 hours.  Recent Results (from the  past 240 hour(s))  SARS CORONAVIRUS 2 (TAT 6-24 HRS) Nasopharyngeal Nasopharyngeal Swab     Status: None   Collection Time: 01/22/19 12:35 PM   Specimen: Nasopharyngeal Swab  Result Value Ref Range Status   SARS Coronavirus 2 NEGATIVE NEGATIVE Final    Comment: (NOTE) SARS-CoV-2 target nucleic acids are NOT DETECTED. The SARS-CoV-2 RNA is generally detectable in upper and lower respiratory specimens during the acute phase of infection. Negative results do not preclude SARS-CoV-2 infection, do not rule out co-infections with other pathogens, and should not be used as the sole basis for treatment or other patient management decisions. Negative results must be combined with clinical observations, patient history, and epidemiological information. The expected result is Negative. Fact Sheet for Patients: SugarRoll.be Fact Sheet for Healthcare Providers: https://www.woods-mathews.com/ This test is not yet approved or cleared by the [place] FDA and  has been authorized for detection and/or diagnosis of SARS-CoV-2 by FDA under an Emergency Use Authorization (EUA). This EUA will remain  in effect (meaning this test can be used) for the duration of the COVID-19 declaration under Section 56 4(b)(1) of the Act, 21 U.S.C. section 360bbb-3(b)(1), unless the authorization is terminated or revoked sooner. Performed at [facility], [place] [address] [place], [place] [address]       Radiology Studies: No results found.      Scheduled Meds: . fluticasone furoate-vilanterol  1 puff Inhalation Daily  . folic acid  1 mg Oral Daily  . gabapentin  900 mg Oral TID  . multivitamin with minerals  1 tablet Oral Daily  . [START ON 01/25/2019] pantoprazole  40 mg Intravenous Q12H  . thiamine  100 mg Oral Daily   Or  . thiamine  100 mg Intravenous Daily  . umeclidinium bromide  1 puff Inhalation Daily   Continuous Infusions: . sodium chloride 75  mL/hr at 01/23/19 0601  . pantoprozole (PROTONIX) infusion 8 mg/hr (01/23/19 0602)    Assessment & Plan:    1.UGIB with acute blood loss anemia: Continue PPI drip. . Hgb 9.0-->7.2--> received 1 unit of PRBC and hemoglobin at 8.1. Hold NSAIDS/asa.Seeb by GI, underwent EGD today-clean-based ulcers per Dr. Paulita Fujita who recommended advance diet as tolerated today, monitor overnight and PPI twice daily on discharge -possibly in a.m.  2. H/O CVA ; holding asa, resume statins-GI to advise the timing for resuming aspirin  3. Alcohol abuse-no signs of withdrawal. On CIWA.  4. Mild AKI: resolved with IV hydration  5. Mild hyponatremia/hypokalemia: in the setting of GI losses. Resolved with IV hydration, potassium replacement.  6. HTN: resume home meds  7. Facial pain: Hold NSAIDs, continue neurontin at 900 mg.  Patient okay with this plan  DVT prophylaxis: SCDs Code Status: Full code Family / Patient Communication: Discussed with patient and daughter bedside. Disposition Plan: Home likely in a.m.     LOS: 0 days    Time spent: 33  minutes    Guilford Shi, MD [facility] Pager [phone]  If 7PM-7AM, please contact night-coverage [email or web address] Password [email or web address] 01/23/2019, 8:45 AM

## 2019-01-23 NOTE — Significant Event (Signed)
Rapid Response Event Note  Overview: Called due to pt has hypotension.    Initial Focused Assessment:   Arrived to room 1425, pt sitting in bed and talking to staff.  Pt is A/O and pleasant.  Pt denies pain, able to follow commands and move all extremities.    Interventions:  Pt placed on monitor, VS placed in chart.  Pt placed on 2L [place].  Pt given 500cc NS bolus x 1.  Pt b/p starting to increase.  [facility] on call made aware, also by pt RN.    Plan of Care (if not transferred):  Pt will remain in current location at this time.  RN in formed to call if pt has more issues.     Dyann Ruddle

## 2019-01-23 NOTE — Progress Notes (Signed)
Pt converted to a-fib RVR. EKG confirmed. Pt asymptomatic at this time. BP 132/59. On call provider Kyere at bedside to assess pt. Will carry out new orders and continue to monitor pt closely.

## 2019-01-23 NOTE — Op Note (Signed)
[facility] Patient Name: Brittany Oliver Procedure Date: 01/23/2019 MRN: [id number] Attending MD: Arta Silence , MD Date of Birth: 1940-12-27 CSN: [id number] Age: 78 Admit Type: Inpatient Procedure:                Upper GI endoscopy Indications:              Acute post hemorrhagic anemia, Coffee-ground                            emesis, Melena Providers:                Arta Silence, MD, Cleda Daub, RN, Elspeth Cho Tech., Technician, Beth Pullium, CRN\A Referring MD:             [facility] Medicines:                Monitored Anesthesia Care Complications:            No immediate complications. Estimated Blood Loss:     Estimated blood loss: none. Procedure:                Pre-Anesthesia Assessment:                           - Prior to the procedure, a History and Physical                            was performed, and patient medications and                            allergies were reviewed. The patient's tolerance of                            previous anesthesia was also reviewed. The risks                            and benefits of the procedure and the sedation                            options and risks were discussed with the patient.                            All questions were answered, and informed consent                            was obtained. Prior Anticoagulants: The patient has                            taken no previous anticoagulant or antiplatelet                            agents except for NSAID medication. ASA Grade  Assessment: III - A patient with severe systemic                            disease. After reviewing the risks and benefits,                            the patient was deemed in satisfactory condition to                            undergo the procedure.                           After obtaining informed consent, the endoscope was   passed under direct vision. Throughout the                            procedure, the patient's blood pressure, pulse, and                            oxygen saturations were monitored continuously. The                            [id number] Olympus gastroscope was                            introduced through the mouth, and advanced to the                            second part of duodenum. The upper GI endoscopy was                            accomplished without difficulty. The patient                            tolerated the procedure well. Scope In: Scope Out: Findings:      A small hiatal hernia was present.      The exam of the esophagus was otherwise normal.      Few non-bleeding cratered gastric ulcers with no stigmata of bleeding       were found in the prepyloric region of the stomach. The largest lesion       was 6 mm in largest dimension.      The exam of the stomach was otherwise normal.      The duodenal bulb, first portion of the duodenum and second portion of       the duodenum were normal. Impression:               - Small hiatal hernia.                           - Non-bleeding gastric ulcers with no stigmata of                            bleeding. Highly likely source of patient's recent  bleeding. NSAIDs vs. H. pylori most likely                            etiologies.                           - Normal duodenal bulb, first portion of the                            duodenum and second portion of the duodenum. Moderate Sedation:      None Recommendation:           - Return patient to hospital ward for ongoing care.                           - Continue present medications, except no NSAIDs                            (baby ASA ok).                           - Pantoprazole 40 mg po bid x 6 weeks, then 40 mg                            po qd indefinitely thereafter.                           - Check H. pylori serologies.                            - No aspirin, ibuprofen, naproxen, or other                            non-steroidal anti-inflammatory drugs until further                            notice.                           - Clear liquid diet today.                           - If tolerates diet well, can be discharged later                            today versus tomorrow morning from GI perspective.                           [facility] will sign-off; we can arrange outpatient                            follow-up with [place]; please call with any questions;                            thank you for the consultation. Procedure Code(s):        ---  Professional ---                           [id number], Esophagogastroduodenoscopy, flexible,                            transoral; diagnostic, including collection of                            specimen(s) by brushing or washing, when performed                            (separate procedure) Diagnosis Code(s):        --- Professional ---                           K44.9, Diaphragmatic hernia without obstruction or                            gangrene                           K25.9, Gastric ulcer, unspecified as acute or                            chronic, without hemorrhage or perforation                           D62, Acute posthemorrhagic anemia                           K92.0, Hematemesis                           K92.1, Melena (includes Hematochezia) CPT copyright 2019 American Medical Association. All rights reserved. The codes documented in this report are preliminary and upon coder review may  be revised to meet current compliance requirements. Arta Silence, MD 01/23/2019 1:58:14 PM This report has been signed electronically. Number of Addenda: 0

## 2019-01-23 NOTE — Interval H&P Note (Signed)
History and Physical Interval Note:  01/23/2019 1:15 PM  [facility]  has presented today for surgery, with the diagnosis of melena, anemia.  The various methods of treatment have been discussed with the patient and family. After consideration of risks, benefits and other options for treatment, the patient has consented to  Procedure(s): ESOPHAGOGASTRODUODENOSCOPY (EGD) WITH PROPOFOL (Left) as a surgical intervention.  The patient's history has been reviewed, patient examined, no change in status, stable for surgery.  I have reviewed the patient's chart and labs.  Questions were answered to the patient's satisfaction.     Daffney Greenly M  Assessment:  1.  Melena. 2.  Acute blood loss anemia. 3.  Coffee ground emesis?  Plan:  1.  Endoscopy with possible treatment of gastrointestinal bleeding. 2.  Risks (bleeding, infection, bowel perforation that could require surgery, sedation-related changes in cardiopulmonary systems), benefits (identification and possible treatment of source of symptoms, exclusion of certain causes of symptoms), and alternatives (watchful waiting, radiographic imaging studies, empiric medical treatment) of upper endoscopy (EGD) were explained to patient/family in detail and patient wishes to proceed.

## 2019-01-23 NOTE — Anesthesia Preprocedure Evaluation (Signed)
Anesthesia Evaluation  Patient identified by MRN, date of birth, ID band Patient awake    Reviewed: Allergy & Precautions, NPO status , Patient's Chart, lab work & pertinent test results  Airway Mallampati: II  TM Distance: >3 FB Neck ROM: Full    Dental no notable dental hx.    Pulmonary COPD, former smoker,    Pulmonary exam normal breath sounds clear to auscultation       Cardiovascular hypertension, Normal cardiovascular exam Rhythm:Regular Rate:Normal     Neuro/Psych CVA negative psych ROS   GI/Hepatic negative GI ROS, Neg liver ROS,   Endo/Other  negative endocrine ROS  Renal/GU negative Renal ROS  negative genitourinary   Musculoskeletal negative musculoskeletal ROS (+)   Abdominal   Peds negative pediatric ROS (+)  Hematology negative hematology ROS (+) anemia ,   Anesthesia Other Findings   Reproductive/Obstetrics negative OB ROS                             Anesthesia Physical Anesthesia Plan  ASA: III  Anesthesia Plan: MAC   Post-op Pain Management:    Induction: Intravenous  PONV Risk Score and Plan:   Airway Management Planned: Simple Face Mask  Additional Equipment:   Intra-op Plan:   Post-operative Plan:   Informed Consent: I have reviewed the patients History and Physical, chart, labs and discussed the procedure including the risks, benefits and alternatives for the proposed anesthesia with the patient or authorized representative who has indicated his/her understanding and acceptance.     Dental advisory given  Plan Discussed with: CRNA and Surgeon  Anesthesia Plan Comments:         Anesthesia Quick Evaluation

## 2019-01-23 NOTE — Plan of Care (Addendum)
Pt went into Afib RVR 150s. Went to bedside to assess pt. Pt states feels heart beating fast.Denies any chest pain,dizziness, n/v, dyspnea or any discomfort.pt also febrile to 101 and on 2L [place] which is not her baseline.  Assessment and plan PAF RVR 150s -EKG confirmed Afib RVR -Metop 5mg  x1 with improvement in HR to 90s-110s -Metop 12.5mg  BID started. -STAT BMP, Mag, Phos, CBC, trop ordered showed K 3.2, mag 1.6, phos 1.7 and trop normal and hgb stable: Ordered electrolyte replacement  -f/u repeat labs in AM -Keep goal K>4, Mag>2 and phos normal -CHADsVasc score 6 but currently not candidate for anticoagulation due to GI bleed. -Consider Cards consult in AM / Echo  Sepsis 2/2 Asp PNA -CXR concerning for PNA.  -consulted pharm for Unasyn -f/u blood cultures, LA, procal -PRN tylenol for fevers -Wean [place] O 2 as tolerates.

## 2019-01-23 NOTE — Anesthesia Procedure Notes (Signed)
Procedure Name: MAC Date/Time: 01/23/2019 1:25 PM Performed by: Niel Hummer, CRNA Pre-anesthesia Checklist: Patient identified, Emergency Drugs available, Suction available and Patient being monitored Patient Re-evaluated:Patient Re-evaluated prior to induction Oxygen Delivery Method: Simple face mask

## 2019-01-23 NOTE — Transfer of Care (Signed)
Immediate Anesthesia Transfer of Care Note  Patient: Brittany Oliver  Procedure(s) Performed: ESOPHAGOGASTRODUODENOSCOPY (EGD) WITH PROPOFOL (Left )  Patient Location: PACU  Anesthesia Type:MAC  Level of Consciousness: awake, alert  and oriented  Airway & Oxygen Therapy: Patient Spontanous Breathing and Patient connected to face mask oxygen  Post-op Assessment: Report given to RN, Post -op Vital signs reviewed and stable and Patient moving all extremities X 4  Post vital signs: Reviewed and stable  Last Vitals:  Vitals Value Taken Time  BP    Temp    Pulse    Resp    SpO2      Last Pain:  Vitals:   01/23/19 1247  TempSrc: Oral  PainSc: 0-No pain      Patients Stated Pain Goal: 3 (15/94/70 7615)  Complications: No apparent anesthesia complications

## 2019-01-24 ENCOUNTER — Inpatient Hospital Stay (HOSPITAL_COMMUNITY): Payer: Medicare Other

## 2019-01-24 DIAGNOSIS — K254 Chronic or unspecified gastric ulcer with hemorrhage: Principal | ICD-10-CM

## 2019-01-24 DIAGNOSIS — I480 Paroxysmal atrial fibrillation: Secondary | ICD-10-CM

## 2019-01-24 DIAGNOSIS — I4891 Unspecified atrial fibrillation: Secondary | ICD-10-CM

## 2019-01-24 LAB — PROCALCITONIN: Procalcitonin: 0.87 ng/mL

## 2019-01-24 LAB — POTASSIUM: Potassium: 4 mmol/L (ref 3.5–5.1)

## 2019-01-24 LAB — HEMOGLOBIN AND HEMATOCRIT, BLOOD
HCT: 23.8 % — ABNORMAL LOW (ref 36.0–46.0)
Hemoglobin: 7.3 g/dL — ABNORMAL LOW (ref 12.0–15.0)

## 2019-01-24 LAB — BASIC METABOLIC PANEL
Anion gap: 9 (ref 5–15)
BUN: 14 mg/dL (ref 8–23)
CO2: 19 mmol/L — ABNORMAL LOW (ref 22–32)
Calcium: 7.6 mg/dL — ABNORMAL LOW (ref 8.9–10.3)
Chloride: 111 mmol/L (ref 98–111)
Creatinine, Ser: 0.79 mg/dL (ref 0.44–1.00)
GFR calc Af Amer: >60 mL/min (ref >60)
GFR calc non Af Amer: >60 mL/min (ref >60)
Glucose, Bld: 118 mg/dL — ABNORMAL HIGH (ref 70–99)
Potassium: 4.5 mmol/L (ref 3.5–5.1)
Sodium: 139 mmol/L (ref 135–145)

## 2019-01-24 LAB — MAGNESIUM
Magnesium: 2.4 mg/dL (ref 1.7–2.4)
Magnesium: 2.2 mg/dL (ref 1.7–2.4)

## 2019-01-24 LAB — ECHOCARDIOGRAM COMPLETE
Height: 62 in
Weight: 2045.87 oz

## 2019-01-24 LAB — CBC
HCT: 27.1 % — ABNORMAL LOW (ref 36.0–46.0)
Hemoglobin: 8.1 g/dL — ABNORMAL LOW (ref 12.0–15.0)
MCH: 30.6 pg (ref 26.0–34.0)
MCHC: 29.9 g/dL — ABNORMAL LOW (ref 30.0–36.0)
MCV: 102.3 fL — ABNORMAL HIGH (ref 80.0–100.0)
Platelets: 154 10*3/uL (ref 150–400)
RBC: 2.65 MIL/uL — ABNORMAL LOW (ref 3.87–5.11)
RDW: 15 % (ref 11.5–15.5)
WBC: 12.6 10*3/uL — ABNORMAL HIGH (ref 4.0–10.5)
nRBC: 0 % (ref 0.0–0.2)

## 2019-01-24 LAB — PHOSPHORUS: Phosphorus: 3.9 mg/dL (ref 2.5–4.6)

## 2019-01-24 LAB — PREPARE RBC (CROSSMATCH)

## 2019-01-24 MED ORDER — METOPROLOL TARTRATE 25 MG PO TABS
12.5000 mg | ORAL_TABLET | Freq: Two times a day (BID) | ORAL | Status: DC
  Administered 2019-01-24 – 2019-01-25 (×3): 12.5 mg via ORAL
  Filled 2019-01-24 (×3): qty 1

## 2019-01-24 MED ORDER — SODIUM CHLORIDE 0.9% IV SOLUTION: Freq: Once | INTRAVENOUS | Status: DC

## 2019-01-24 MED ORDER — METOPROLOL TARTRATE 25 MG PO TABS: 25.0000 mg | ORAL_TABLET | Freq: Two times a day (BID) | ORAL | Status: DC

## 2019-01-24 MED ORDER — SODIUM CHLORIDE 0.9 % IV SOLN
3.0000 g | Freq: Four times a day (QID) | INTRAVENOUS | Status: AC
  Administered 2019-01-24 – 2019-01-27 (×14): 3 g via INTRAVENOUS
  Filled 2019-01-24 (×14): qty 3

## 2019-01-24 MED ORDER — SODIUM CHLORIDE 0.9 % IV BOLUS
500.0000 mL | Freq: Once | INTRAVENOUS | Status: AC
  Administered 2019-01-24: 500 mL via INTRAVENOUS

## 2019-01-24 MED ORDER — DILTIAZEM HCL 25 MG/5ML IV SOLN
10.0000 mg | Freq: Once | INTRAVENOUS | Status: AC
  Administered 2019-01-24: 10 mg via INTRAVENOUS
  Filled 2019-01-24: qty 5

## 2019-01-24 NOTE — Consult Note (Signed)
CARDIOLOGY CONSULT NOTE       Patient ID: Brittany Oliver MRN: [id number] DOB/AGE: 77/07/1940 77 y.o.  Admit date: 01/22/2019 Referring Physician: Tana Coast Primary Physician: [name], MD Primary Cardiologist: New Reason for Consultation: PAF  Active Problems:   Atypical facial pain   COPD mixed type ([place])   GI bleed   Hypertension   Hyperlipidemia   Stroke [facility])   Anemia due to acute blood loss   Neuralgia facialis vera   HPI:  78 y.o. admitted with nausea, vomiting and diarrhea with GI bleed 01/22/19 History of murmur , HLD, HTN and distant stroke in 1987 details not clear Quit smoking over a decade ago Had melena with coffe ground emesis and Hb 9 Does consume ETOH daily  Noted to go into afib yesterday evening rates 120-130 with stable hemodynamic.  This am had conversion pauses and is now in NSR rates in 50's and feels fine Echo being done at bedside now and preliminary to my view shows normal EF She has no chest pain palpitations or dyspnea. Seems somewhat stressed caring for husband who has myeloma Noted to have gastric ulcers on EGD    ROS All other systems reviewed and negative except as noted above  Past Medical History:  Diagnosis Date  . Arthritis   . Cancer ([place]) 02/2013   skin melanoma  . Heart murmur   . Hyperlipidemia   . Hypertension   . Neuralgia facialis vera   . Stroke [facility]) 1987   has a little balance issue since stroke    Family History  Adopted: Yes  Problem Relation Age of Onset  . Ataxia Neg Hx   . Chorea Neg Hx   . Dementia Neg Hx   . Mental retardation Neg Hx   . Migraines Neg Hx   . Multiple sclerosis Neg Hx   . Neurofibromatosis Neg Hx   . Neuropathy Neg Hx   . Parkinsonism Neg Hx   . Seizures Neg Hx   . Stroke Neg Hx     Social History   Socioeconomic History  . Marital status: Married    Spouse name: Not on file  . Number of children: Not on file  . Years of education: Not on file  . Highest education level: Not on  file  Occupational History  . Not on file  Social Needs  . Financial resource strain: Not on file  . Food insecurity    Worry: Not on file    Inability: Not on file  . Transportation needs    Medical: Not on file    Non-medical: Not on file  Tobacco Use  . Smoking status: Former Smoker    Packs/day: 0.50    Years: 43.20    Pack years: 21.60    Types: Cigarettes    Quit date: 03/23/2005    Years since quitting: 13.8  . Smokeless tobacco: Never Used  . Tobacco comment: QUIT 9 YEARS AGO  Substance and Sexual Activity  . Alcohol use: Yes    Alcohol/week: 5.0 standard drinks    Types: 5 Glasses of wine per week  . Drug use: No  . Sexual activity: Yes    Partners: Male  Lifestyle  . Physical activity    Days per week: Not on file    Minutes per session: Not on file  . Stress: Not on file  Relationships  . Social Herbalist on phone: Not on file    Gets together: Not on file  Attends religious service: Not on file    Active member of club or organization: Not on file    Attends meetings of clubs or organizations: Not on file    Relationship status: Not on file  . Intimate partner violence    Fear of current or ex partner: Not on file    Emotionally abused: Not on file    Physically abused: Not on file    Forced sexual activity: Not on file  Other Topics Concern  . Not on file  Social History Narrative  . Not on file    Past Surgical History:  Procedure Laterality Date  . ABDOMINAL HYSTERECTOMY  1974   pt still has her uterus, took only 1 ovary and 1 tube  . APPENDECTOMY    . COLONOSCOPY    . DILATION AND CURETTAGE OF UTERUS  1965  . ESOPHAGOGASTRODUODENOSCOPY (EGD) WITH PROPOFOL Left 01/23/2019   Procedure: ESOPHAGOGASTRODUODENOSCOPY (EGD) WITH PROPOFOL;  Surgeon: Arta Silence, MD;  Location: [facility];  Service: Endoscopy;  Laterality: Left;  . EXCISION MELANOMA WITH SENTINEL LYMPH NODE BIOPSY Left 03/27/2013   Procedure: EXCISION MELANOMA LEFT  POSTERIOR ARM WITH LEFT AXILLARY SENTINEL LYMPH NODE BIOPSY;  Surgeon: Zenovia Jarred, MD;  Location: [place];  Service: General;  Laterality: Left;  . EYE SURGERY Bilateral 2006   cataract and laser surgery  . FINGER SURGERY Right    middle finger  . MASS EXCISION Left 09/15/2013   Procedure: EXCISION MASS LEFT UPPER ARM;  Surgeon: Zenovia Jarred, MD;  Location: [place];  Service: General;  Laterality: Left;  . TONSILLECTOMY       . sodium chloride   Intravenous Once  . fluticasone furoate-vilanterol  1 puff Inhalation Daily  . folic acid  1 mg Oral Daily  . gabapentin  900 mg Oral TID  . metoprolol tartrate  25 mg Oral BID  . multivitamin with minerals  1 tablet Oral Daily  . pantoprazole  40 mg Oral BID AC  . thiamine  100 mg Oral Daily   Or  . thiamine  100 mg Intravenous Daily  . umeclidinium bromide  1 puff Inhalation Daily   . ampicillin-sulbactam (UNASYN) IV 3 g (01/24/19 [id number])    Physical Exam: Blood pressure 113/83, pulse (!) 122, temperature 98.9 F (37.2 C), temperature source Oral, resp. rate 18, height 5\' 2"  (1.575 m), weight 58 kg, SpO2 98 %.   Affect appropriate Pale female  HEENT: normal Neck supple with no adenopathy JVP normal no bruits no thyromegaly Lungs clear with no wheezing and good diaphragmatic motion Heart:  S1/S2 no murmur, no rub, gallop or click PMI normal Abdomen: benighn, BS positve, no tenderness, no AAA no bruit.  No HSM or HJR Distal pulses intact with no bruits No edema Neuro non-focal Skin warm and dry No muscular weakness   Labs:   Lab Results  Component Value Date   WBC 7.1 01/23/2019   HGB 7.3 (L) 01/24/2019   HCT 23.8 (L) 01/24/2019   MCV 97.8 01/23/2019   PLT 135 (L) 01/23/2019    Recent Labs  Lab 01/23/19 0609 01/23/19 1949 01/24/19 0521  NA 141 139  --   K 3.8 3.2* 4.0  CL 110 108  --   CO2 25 24  --   BUN 27* 18  --   CREATININE 0.81 0.71  --   CALCIUM 8.0* 8.0*  --   PROT 5.2*  --   --   BILITOT 0.9  --    --  ALKPHOS 33*  --   --   ALT 11  --   --   AST 12*  --   --   GLUCOSE 118* 127*  --       Radiology: Dg Chest 1 View  Result Date: 01/23/2019 CLINICAL DATA:  Shortness of breath status post EGD EXAM: CHEST  1 VIEW COMPARISON:  12/30/2014 FINDINGS: Right lung is grossly clear. Patchy airspace opacities within the left mid and lower lung. No pleural effusion. Normal heart size. No pneumothorax. IMPRESSION: Moderate patchy airspace opacity in the left mid to lower lung, possibly representing pneumonia. Electronically Signed   By: Donavan Foil M.D.   On: 01/23/2019 20:16   Dg Abd 1 View  Result Date: 01/23/2019 CLINICAL DATA:  Shortness of breath after EGD EXAM: ABDOMEN - 1 VIEW COMPARISON:  None. FINDINGS: Patchy airspace disease left base. Mild diffuse increased bowel gas without obstructive pattern. No radiopaque calculi. Scoliosis and degenerative changes of the spine IMPRESSION: Nonobstructed bowel-gas pattern Electronically Signed   By: Donavan Foil M.D.   On: 01/23/2019 20:17    EKG: SR rate 52 normal    ASSESSMENT AND PLAN:   1. PaF:  Likely precipitated by stress of GI bleed. Converted with bradycardia will decrease lopressor dose. Echo pending. Not a candidate for anticoagulation due to GI bleeding   2. GI bleed: ? Presumed form gastric ulcer Hb still low 7.3 on protonix plan per GI and primary service   3. ETOH:  Monitor for withdrawal   4. HLD:  Continue statin   Signed: Jenkins Rouge 01/24/2019, 9:07 AM

## 2019-01-24 NOTE — Progress Notes (Signed)
  Echocardiogram 2D Echocardiogram has been performed.  Brittany Oliver 01/24/2019, 9:30 AM

## 2019-01-24 NOTE — Progress Notes (Signed)
Pharmacy Antibiotic Note  Brittany Oliver is a 78 y.o. female admitted on 01/22/2019 with pneumonia.  Pharmacy has been consulted for Unasyn dosing.  Plan: Unasyn 3gm iv q6hr  Height: 5\' 2"  (157.5 cm) Weight: 127 lb 13.9 oz (58 kg) IBW/kg (Calculated) : 50.1  Temp (24hrs), Avg:100.2 F (37.9 C), Min:98.1 F (36.7 C), Max:103.1 F (39.5 C)  Recent Labs  Lab 01/22/19 1131 01/22/19 1840 01/23/19 0609 01/23/19 1946 01/23/19 1949 01/23/19 2115 01/23/19 2246  WBC 9.6  --  4.8 7.1  --   --   --   CREATININE 1.10* 0.86 0.81  --  0.71  --   --   LATICACIDVEN  --   --   --  1.0  --  1.1 1.1    Estimated Creatinine Clearance: 46.6 mL/min (by C-G formula based on SCr of 0.71 mg/dL).    No Known Allergies  Antimicrobials this admission: Unasyn 01/23/2019 >>  Dose adjustments this admission: -  Microbiology results: -  Thank you for allowing pharmacy to be a part of this patient's care.  Nani Skillern Crowford 01/24/2019 3:30 AM

## 2019-01-24 NOTE — Progress Notes (Signed)
On call provider made aware of hgb trending down to 7.3. No signs or symptoms of bleeding from pt. No new orders at this time. Will continue to monitor.

## 2019-01-24 NOTE — Progress Notes (Signed)
[facility]                                                                              Patient Demographics  Brittany Oliver, is a 78 y.o. female, DOB - 1940/04/05, KLK:[id number]  Admit date - 01/22/2019   Admitting Physician Elmarie Shiley, MD  Outpatient Primary MD for the patient is Brittany Stains, MD  Outpatient specialists:   LOS - 1  days   Medical records reviewed and are as summarized below:    Chief Complaint  Patient presents with  . Emesis  . Diarrhea       Brief summary   78 y.o.femalepast medical history significant for stroke, hypertension, hyperlipidemia, history of skin melanoma who presents  With c/o nausea vomiting, coffee-ground emesis episodes and also associated with diarrhea/ melena in the setting daily alcohol, ASA and NSAID use. Last colonoscopy 2007 showed some hyperplastic polyps. No prior endoscopy.  Hemoglobin on admission 9.0.  Patient was admitted for further work-up  Assessment & Plan    Principal problem Acute blood loss anemia with upper GI bleed -Patient was placed on PPI IV drip.  Hemoglobin 9.0-> 7.2, received 1 unit of packed RBCs. -GI was consulted, underwent EGD which showed nonbleeding gastric ulcers with no stigmata of bleeding, highly likely source of patient's recent bleeding, NSAIDs versus H. pylori most likely etiologies.  Normal duodenum. -Hemoglobin 7.3 this morning, ordered 1 unit of packed RBC transfusion but repeat H&H this morning however showed hemoglobin of 8.1  Paroxysmal atrial fibrillation with RVR-new diagnosis - Overnight patient converted to atrial fibrillation with RVR, heart rate in 150s.  Was placed on beta-blocker -This a.m., heart rate still not controlled, in 120s, gave 1 dose of IV Cardizem 20 mg, patient converted from A. fib to normal sinus rhythm however had a 4.9-second pause, BP plummeted down to 77/54 and heart rate down to 50s.  Patient received IV fluid bolus to support.  -Cardiology was consulted, recommended lower Lopressor dose, not anticoagulation candidate due to GI bleeding -2D echo showed EF of 60 to 65% with no LVH  Left lung pneumonia -Post ED EGD, patient was found to have shortness of breath possibly could be from A. fib with RVR however chest x-ray showed moderate patchy airspace opacity in the left mid to lower lung. -Leukocytosis, overnight with fevers, 101.5 F.  Overnight was tachycardiac, hypotensive, met sepsis criteria  -Continue IV Unasyn  History of CVA Holding aspirin at this time, now has new paroxysmal A. fib, no AC due to GI bleed  History of alcohol use Monitor closely for any withdrawals, continue CIWA  Mild AKI -Resolved  Mild hyponatremia, hypokalemia -Currently stable  Facial pain Hold NSAIDs, continue Neurontin.  Code Status: Full CODE STATUS DVT Prophylaxis: SCDs Family Communication: Discussed all imaging results, lab results, explained to the patient   Disposition Plan: Hopefully next 24 to 48 hours  Time Spent in minutes 35 minutes  Procedures:  2D echo  Consultants:   Cardiology  Antimicrobials:   Anti-infectives (From admission, onward)   Start     Dose/Rate Route Frequency Ordered Stop   01/24/19 0600  Ampicillin-Sulbactam (UNASYN)  3 g in sodium chloride 0.9 % 100 mL IVPB     3 g 200 mL/hr over 30 Minutes Intravenous Every 6 hours 01/24/19 0328     01/23/19 2130  Ampicillin-Sulbactam (UNASYN) 3 g in sodium chloride 0.9 % 100 mL IVPB     3 g 200 mL/hr over 30 Minutes Intravenous  Once 01/23/19 2123 01/24/19 0033          Medications  Scheduled Meds: . sodium chloride   Intravenous Once  . fluticasone furoate-vilanterol  1 puff Inhalation Daily  . folic acid  1 mg Oral Daily  . gabapentin  900 mg Oral TID  . metoprolol tartrate  12.5 mg Oral BID  . multivitamin with minerals  1 tablet Oral Daily  . pantoprazole  40 mg Oral BID AC  . thiamine  100 mg Oral Daily   Or  . thiamine   100 mg Intravenous Daily  . umeclidinium bromide  1 puff Inhalation Daily   Continuous Infusions: . ampicillin-sulbactam (UNASYN) IV 3 g (01/24/19 1249)   PRN Meds:.acetaminophen **OR** acetaminophen, fentaNYL (SUBLIMAZE) injection, LORazepam **OR** LORazepam, ondansetron **OR** ondansetron (ZOFRAN) IV, polyvinyl alcohol      Subjective:   Breck Neyens was seen and examined today.  Overnight events noted, this morning during my encounter still in A. fib fibrillation with RVR with a heart rate in 120s.  Afebrile this morning, overnight spiking temp.  Patient denies  chest pain, shortness of breath, abdominal pain, N/V/D/C, new weakness, numbess, tingling.   Objective:   Vitals:   01/24/19 0945 01/24/19 1100 01/24/19 1229 01/24/19 1343  BP: (!) 84/44 (!) 95/51 (!) 106/53 (!) 101/48  Pulse: (!) 55 61 67 77  Resp: 16 16 16    Temp: 98.7 F (37.1 C)  98.2 F (36.8 C) 98 F (36.7 C)  TempSrc: Oral  Oral   SpO2: 98% 100% 100% 96%  Weight:      Height:        Intake/Output Summary (Last 24 hours) at 01/24/2019 1517 Last data filed at 01/24/2019 1245 Gross per 24 hour  Intake 2027.47 ml  Output 50 ml  Net 1977.47 ml     Wt Readings from Last 3 Encounters:  01/23/19 58 kg  01/17/18 58.5 kg  01/17/17 57.6 kg     Exam  General: Alert and oriented x 3, NAD  Eyes:   HEENT:  Atraumatic, normocephalic, normal oropharynx  Cardiovascular: Irregularly irregular, tachycardia  Respiratory: Clear to auscultation bilaterally, no wheezing, rales or rhonchi  Gastrointestinal: Soft, nontender, nondistended, + bowel sounds  Ext: no pedal edema bilaterally  Neuro: No new FND's  Musculoskeletal: No digital cyanosis, clubbing  Skin: No rashes  Psych: Normal affect and demeanor, alert and oriented x3    Data Reviewed:  I have personally reviewed following labs and imaging studies  Micro Results Recent Results (from the past 240 hour(s))  SARS CORONAVIRUS 2 (TAT 6-24  HRS) Nasopharyngeal Nasopharyngeal Swab     Status: None   Collection Time: 01/22/19 12:35 PM   Specimen: Nasopharyngeal Swab  Result Value Ref Range Status   SARS Coronavirus 2 NEGATIVE NEGATIVE Final    Comment: (NOTE) SARS-CoV-2 target nucleic acids are NOT DETECTED. The SARS-CoV-2 RNA is generally detectable in upper and lower respiratory specimens during the acute phase of infection. Negative results do not preclude SARS-CoV-2 infection, do not rule out co-infections with other pathogens, and should not be used as the sole basis for treatment or other patient management decisions. Negative results  must be combined with clinical observations, patient history, and epidemiological information. The expected result is Negative. Fact Sheet for Patients: SugarRoll.be Fact Sheet for Healthcare Providers: https://www.woods-mathews.com/ This test is not yet approved or cleared by the [place] FDA and  has been authorized for detection and/or diagnosis of SARS-CoV-2 by FDA under an Emergency Use Authorization (EUA). This EUA will remain  in effect (meaning this test can be used) for the duration of the COVID-19 declaration under Section 56 4(b)(1) of the Act, 21 U.S.C. section 360bbb-3(b)(1), unless the authorization is terminated or revoked sooner. Performed at [facility], [place] [address], [place], [place] [address]   Culture, blood (routine x 2)     Status: None (Preliminary result)   Collection Time: 01/23/19  6:52 PM   Specimen: BLOOD RIGHT ARM  Result Value Ref Range Status   Specimen Description   Final    BLOOD RIGHT ARM Performed at [facility] [address] [place], [place] [address]    Special Requests   Final    BOTTLES DRAWN AEROBIC AND ANAEROBIC Blood Culture adequate volume Performed at [facility] [address], [place], [place] [address]    Culture   Final    NO GROWTH  < 12 HOURS Performed at [facility] [address] [place], [place] [address]    Report Status PENDING  Incomplete  Culture, blood (routine x 2)     Status: None (Preliminary result)   Collection Time: 01/23/19  6:57 PM   Specimen: BLOOD  Result Value Ref Range Status   Specimen Description   Final    BLOOD LEFT ANTECUBITAL Performed at [facility] [address] [place], [place] [address]    Special Requests   Final    BOTTLES DRAWN AEROBIC AND ANAEROBIC Blood Culture adequate volume Performed at [facility] [address] [place], [place] [address]    Culture   Final    NO GROWTH < 12 HOURS Performed at [facility] [address] [place],  [address]    Report Status PENDING  Incomplete    Radiology Reports Dg Chest 1 View  Result Date: 01/23/2019 CLINICAL DATA:  Shortness of breath status post EGD EXAM: CHEST  1 VIEW COMPARISON:  12/30/2014 FINDINGS: Right lung is grossly clear. Patchy airspace opacities within the left mid and lower lung. No pleural effusion. Normal heart size. No pneumothorax. IMPRESSION: Moderate patchy airspace opacity in the left mid to lower lung, possibly representing pneumonia. Electronically Signed   By: Donavan Foil M.D.   On: 01/23/2019 20:16   Dg Abd 1 View  Result Date: 01/23/2019 CLINICAL DATA:  Shortness of breath after EGD EXAM: ABDOMEN - 1 VIEW COMPARISON:  None. FINDINGS: Patchy airspace disease left base. Mild diffuse increased bowel gas without obstructive pattern. No radiopaque calculi. Scoliosis and degenerative changes of the spine IMPRESSION: Nonobstructed bowel-gas pattern Electronically Signed   By: Donavan Foil M.D.   On: 01/23/2019 20:17    Lab Data:  CBC: Recent Labs  Lab 01/22/19 1131  01/23/19 0609 01/23/19 1438 01/23/19 1946 01/23/19 2246 01/24/19 0153 01/24/19 0925  WBC 9.6  --  4.8  --  7.1  --   --  12.6*  HGB 9.0*   < > 8.1* 8.7* 8.4* 7.7* 7.3*  8.1*  HCT 28.2*   < > 25.2* 28.2* 26.9* 24.5* 23.8* 27.1*  MCV 96.6  --  96.6  --  97.8  --   --  102.3*  PLT 261  --  128*  --  135*  --   --  154   < > = values in this interval not displayed.   Basic Metabolic Panel: Recent Labs  Lab 01/22/19 1131 01/22/19 1840 01/23/19 0609 01/23/19 1949 01/24/19 0521 01/24/19 0925  NA 132* 138 141 139  --  139  K 3.2* 4.3 3.8 3.2* 4.0 4.5  CL 95* 107 110 108  --  111  CO2 24 24 25 24   --  19*  GLUCOSE 172* 112* 118* 127*  --  118*  BUN 54* 42* 27* 18  --  14  CREATININE 1.10* 0.86 0.81 0.71  --  0.79  CALCIUM 8.8* 8.5* 8.0* 8.0*  --  7.6*  MG  --   --   --  1.6* 2.4 2.2  PHOS  --   --   --  1.7* 3.9  --    GFR: Estimated Creatinine Clearance: 46.6 mL/min (by C-G formula based on SCr of 0.79 mg/dL). Liver Function Tests: Recent Labs  Lab 01/22/19 1131 01/22/19 1840 01/23/19 0609  AST 19 15 12*  ALT 18 15 11   ALKPHOS 43 35* 33*  BILITOT 0.5 0.7 0.9  PROT 6.9 5.8* 5.2*  ALBUMIN 4.3 3.7 3.1*   Recent Labs  Lab 01/22/19 1131  LIPASE 26   No results for input(s): AMMONIA in the last 168 hours. Coagulation Profile: No results for input(s): INR, PROTIME in the last 168 hours. Cardiac Enzymes: No results for input(s): CKTOTAL, CKMB, CKMBINDEX, TROPONINI in the last 168 hours. BNP (last 3 results) No results for input(s): PROBNP in the last 8760 hours. HbA1C: No results for input(s): HGBA1C in the last 72 hours. CBG: No results for input(s): GLUCAP in the last 168 hours. Lipid Profile: No results for input(s): CHOL, HDL, LDLCALC, TRIG, CHOLHDL, LDLDIRECT in the last 72 hours. Thyroid Function Tests: No results for input(s): TSH, T4TOTAL, FREET4, T3FREE, THYROIDAB in the last 72 hours. Anemia Panel: No results for input(s): VITAMINB12, FOLATE, FERRITIN, TIBC, IRON, RETICCTPCT in the last 72 hours. Urine analysis:    Component Value Date/Time   COLORURINE YELLOW 01/23/2019 1815   APPEARANCEUR HAZY (A) 01/23/2019 1815    LABSPEC 1.017 01/23/2019 1815   PHURINE 6.0 01/23/2019 1815   GLUCOSEU NEGATIVE 01/23/2019 1815   HGBUR NEGATIVE 01/23/2019 1815   [place] NEGATIVE 01/23/2019 [place] NEGATIVE 01/23/2019 1815   PROTEINUR 30 (A) 01/23/2019 1815   NITRITE NEGATIVE 01/23/2019 1815   LEUKOCYTESUR SMALL (A) 01/23/2019 1815     Keiarah Orlowski M.D. [facility] 01/24/2019, 3:17 PM  Pager: [phone] Between 7am to 7pm - call Pager - [phone]  After 7pm go to [email or web address] - password [email or web address]  Call night coverage person covering after 7pm

## 2019-01-24 NOTE — Progress Notes (Signed)
Patient HR sustaining in the 40's and 50's after cardizem bolus, dropping as low as 36 unsustained.  CCMD stated patient had a 4.9 second pause and then converted from a-fib into NSR.  Blood pressure 77/54.  Rapid response and MD paged.  IV fluids given for blood pressure support.  HR now sustaining in the 50's.  Blood infusing.  Patient asymptomatic and resting comfortably in bed.

## 2019-01-25 ENCOUNTER — Inpatient Hospital Stay (HOSPITAL_COMMUNITY): Payer: Medicare Other

## 2019-01-25 DIAGNOSIS — R0600 Dyspnea, unspecified: Secondary | ICD-10-CM

## 2019-01-25 LAB — BASIC METABOLIC PANEL
Anion gap: 6 (ref 5–15)
BUN: 11 mg/dL (ref 8–23)
CO2: 23 mmol/L (ref 22–32)
Calcium: 8 mg/dL — ABNORMAL LOW (ref 8.9–10.3)
Chloride: 109 mmol/L (ref 98–111)
Creatinine, Ser: 0.8 mg/dL (ref 0.44–1.00)
GFR calc Af Amer: >60 mL/min (ref >60)
GFR calc non Af Amer: >60 mL/min (ref >60)
Glucose, Bld: 118 mg/dL — ABNORMAL HIGH (ref 70–99)
Potassium: 3.7 mmol/L (ref 3.5–5.1)
Sodium: 138 mmol/L (ref 135–145)

## 2019-01-25 LAB — TYPE AND SCREEN
ABO/RH(D): A POS
Antibody Screen: NEGATIVE
Unit division: 0
Unit division: 0

## 2019-01-25 LAB — BPAM RBC
Blood Product Expiration Date: 202012052359
Blood Product Expiration Date: 202012052359
ISSUE DATE / TIME: 202011052328
ISSUE DATE / TIME: 202011070926
Unit Type and Rh: 6200
Unit Type and Rh: 6200

## 2019-01-25 LAB — CBC
HCT: 31.8 % — ABNORMAL LOW (ref 36.0–46.0)
Hemoglobin: 9.8 g/dL — ABNORMAL LOW (ref 12.0–15.0)
MCH: 30.7 pg (ref 26.0–34.0)
MCHC: 30.8 g/dL (ref 30.0–36.0)
MCV: 99.7 fL (ref 80.0–100.0)
Platelets: 150 10*3/uL (ref 150–400)
RBC: 3.19 MIL/uL — ABNORMAL LOW (ref 3.87–5.11)
RDW: 15.2 % (ref 11.5–15.5)
WBC: 8.5 10*3/uL (ref 4.0–10.5)
nRBC: 0 % (ref 0.0–0.2)

## 2019-01-25 LAB — BRAIN NATRIURETIC PEPTIDE: B Natriuretic Peptide: 2451 pg/mL — ABNORMAL HIGH (ref 0.0–100.0)

## 2019-01-25 LAB — PROCALCITONIN: Procalcitonin: 0.25 ng/mL

## 2019-01-25 MED ORDER — FLECAINIDE ACETATE 100 MG PO TABS
300.0000 mg | ORAL_TABLET | Freq: Once | ORAL | Status: AC
  Administered 2019-01-25: 300 mg via ORAL
  Filled 2019-01-25: qty 3

## 2019-01-25 MED ORDER — FLECAINIDE ACETATE 50 MG PO TABS
75.0000 mg | ORAL_TABLET | Freq: Two times a day (BID) | ORAL | Status: DC
  Filled 2019-01-25: qty 2

## 2019-01-25 MED ORDER — DIGOXIN 0.25 MG/ML IJ SOLN
0.2500 mg | Freq: Once | INTRAMUSCULAR | Status: DC
  Filled 2019-01-25: qty 1

## 2019-01-25 MED ORDER — FUROSEMIDE 10 MG/ML IJ SOLN
20.0000 mg | Freq: Once | INTRAMUSCULAR | Status: AC
  Administered 2019-01-25: 20 mg via INTRAVENOUS
  Filled 2019-01-25: qty 2

## 2019-01-25 MED ORDER — POTASSIUM CHLORIDE CRYS ER 20 MEQ PO TBCR
20.0000 meq | EXTENDED_RELEASE_TABLET | Freq: Once | ORAL | Status: AC
  Administered 2019-01-25: 20 meq via ORAL
  Filled 2019-01-25: qty 1

## 2019-01-25 NOTE — Progress Notes (Signed)
Patient noted to have converted back to NSR with HR in the 70s about 3pm today. Received Tambocor 300mg  at 1357 today.

## 2019-01-25 NOTE — Progress Notes (Signed)
Called by Dr Tana Coast and floor that patient went back into rapid afib Hemodynamics stable Baseline ECG normal including QT and intervals Echo with normal EF no LVH No history of CAD Will write for one time dose of flecainide 300 mg  And then 75 mg bid in am Keep in hospital and monitor  Not a candidate for anticoagulation due to GI bleed  Brittany Oliver

## 2019-01-25 NOTE — Progress Notes (Signed)
Subjective:  Denies SSCP, palpitations or Dyspnea Wants to go home Spoke with daughter Maudie Mercury who is CT tech at [employer]  Objective:  Vitals:   01/24/19 1725 01/24/19 2108 01/25/19 0532 01/25/19 0742  BP: 140/68 (!) 149/72 136/61   Pulse: 83 81 84   Resp: 16 16 18    Temp: 98.4 F (36.9 C) 98.2 F (36.8 C) 98.7 F (37.1 C)   TempSrc: Oral Oral Oral   SpO2: 94% 90% (!) 89% 93%  Weight:      Height:        Intake/Output from previous day:  Intake/Output Summary (Last 24 hours) at 01/25/2019 0841 Last data filed at 01/25/2019 0600 Gross per 24 hour  Intake 1279.83 ml  Output -  Net 1279.83 ml    Physical Exam:  Affect appropriate Healthy:  appears stated age HEENT: normal Neck supple with no adenopathy JVP normal no bruits no thyromegaly Lungs clear with no wheezing and good diaphragmatic motion Heart:  S1/S2 no murmur, no rub, gallop or click PMI normal Abdomen: benighn, BS positve, no tenderness, no AAA no bruit.  No HSM or HJR Distal pulses intact with no bruits No edema Neuro non-focal Skin warm and dry No muscular weakness   Lab Results: Basic Metabolic Panel: Recent Labs    01/23/19 1949 01/24/19 0521 01/24/19 0925 01/25/19 0429  NA 139  --  139 138  K 3.2* 4.0 4.5 3.7  CL 108  --  111 109  CO2 24  --  19* 23  GLUCOSE 127*  --  118* 118*  BUN 18  --  14 11  CREATININE 0.71  --  0.79 0.80  CALCIUM 8.0*  --  7.6* 8.0*  MG 1.6* 2.4 2.2  --   PHOS 1.7* 3.9  --   --    Liver Function Tests: Recent Labs    01/22/19 1840 01/23/19 0609  AST 15 12*  ALT 15 11  ALKPHOS 35* 33*  BILITOT 0.7 0.9  PROT 5.8* 5.2*  ALBUMIN 3.7 3.1*   Recent Labs    01/22/19 1131  LIPASE 26   CBC: Recent Labs    01/24/19 0925 01/25/19 0429  WBC 12.6* 8.5  HGB 8.1* 9.8*  HCT 27.1* 31.8*  MCV 102.3* 99.7  PLT 154 150    Imaging: Dg Chest 1 View  Result Date: 01/23/2019 CLINICAL DATA:  Shortness of breath status post EGD EXAM: CHEST  1 VIEW COMPARISON:   12/30/2014 FINDINGS: Right lung is grossly clear. Patchy airspace opacities within the left mid and lower lung. No pleural effusion. Normal heart size. No pneumothorax. IMPRESSION: Moderate patchy airspace opacity in the left mid to lower lung, possibly representing pneumonia. Electronically Signed   By: Donavan Foil M.D.   On: 01/23/2019 20:16   Dg Abd 1 View  Result Date: 01/23/2019 CLINICAL DATA:  Shortness of breath after EGD EXAM: ABDOMEN - 1 VIEW COMPARISON:  None. FINDINGS: Patchy airspace disease left base. Mild diffuse increased bowel gas without obstructive pattern. No radiopaque calculi. Scoliosis and degenerative changes of the spine IMPRESSION: Nonobstructed bowel-gas pattern Electronically Signed   By: Donavan Foil M.D.   On: 01/23/2019 20:17    Cardiac Studies:  ECG: SR rate 53 normal    Telemetry:  NSR 01/25/2019   Echo: EF normal 60-65%   Medications:   . sodium chloride   Intravenous Once  . fluticasone furoate-vilanterol  1 puff Inhalation Daily  . folic acid  1 mg Oral Daily  .  gabapentin  900 mg Oral TID  . metoprolol tartrate  12.5 mg Oral BID  . multivitamin with minerals  1 tablet Oral Daily  . pantoprazole  40 mg Oral BID AC  . thiamine  100 mg Oral Daily   Or  . thiamine  100 mg Intravenous Daily  . umeclidinium bromide  1 puff Inhalation Daily     . ampicillin-sulbactam (UNASYN) IV 3 g (01/25/19 0615)    Assessment/Plan:   1. PAF: in setting of GI bleed Converted to NSR. Continue beta blocker not candidate for anticoagulation due to bleeding Normal atrial sizes on echo suggest less risk of recurrence will arrange outpatient f/u with me and 14 day event monitor to survey for recurrence   2. GI bleed:  Hct stable 31.8 gastric ulcer on protonix per GI / primary service  Ok to d/c home from cardiology perspective Will sign off  Jenkins Rouge 01/25/2019, 8:41 AM

## 2019-01-25 NOTE — Progress Notes (Signed)
[facility]                                                                              Patient Demographics  Brittany Oliver, is a 78 y.o. female, DOB - 10-04-1940, TDH:[id number]  Admit date - 01/22/2019   Admitting Physician Elmarie Shiley, MD  Outpatient Primary MD for the patient is [name], MD  Outpatient specialists:   LOS - 2  days   Medical records reviewed and are as summarized below:    Chief Complaint  Patient presents with  . Emesis  . Diarrhea       Brief summary   78 y.o.femalepast medical history significant for stroke, hypertension, hyperlipidemia, history of skin melanoma who presents  With c/o nausea vomiting, coffee-ground emesis episodes and also associated with diarrhea/ melena in the setting daily alcohol, ASA and NSAID use. Last colonoscopy 2007 showed some hyperplastic polyps. No prior endoscopy.  Hemoglobin on admission 9.0.  Patient was admitted for further work-up  Assessment & Plan    Principal problem Acute blood loss anemia with upper GI bleed -Patient was placed on PPI IV drip.  Hemoglobin 9.0-> 7.2, received 1 unit of packed RBCs. -GI was consulted, underwent EGD which showed nonbleeding gastric ulcers with no stigmata of bleeding, highly likely source of patient's recent bleeding, NSAIDs versus H. pylori most likely etiologies.  Normal duodenum. -H&H currently stable, 9.8, received 1 unit packed RBC transfusion on 11/7  Paroxysmal atrial fibrillation with RVR-new diagnosis -Patient had converted to A. fib with RVR on 11/7 overnight, was placed on beta-blocker.  BP had plummeted down with 1 dose of IV Cardizem.  Cardiology was consulted and recommended lower dose of Lopressor.  Not an anticoagulation candidate due to GI bleeding. -2D echo showed EF of 60 to 65% with no LVH -Plan was to DC home today if stable however patient went back into rapid A. fib.  Discussed with cardiology, started on flecainide with  337m loading dose and 75 mg twice daily.  Mild acute diastolic CHF likely due to volume overload, atrial fibrillation with RVR, IV fluids -This morning, patient complaining of shortness of breath, O2 sats 93% on 2 L -BNP 2451, chest x-ray showed slight interval improvement in left mid to lower lung consolidation, new right lower lung consolidation, small left greater than right bilateral pleural effusions -Given Lasix 20 mg IV x1, with improvement in patient's symptoms  Left lung pneumonia -Post ED EGD, patient was found to have shortness of breath possibly could be from A. fib with RVR however chest x-ray showed moderate patchy airspace opacity in the left mid to lower lung. -Continue IV Unasyn  History of CVA Holding aspirin at this time, now has new paroxysmal A. fib, no AC due to GI bleed  History of alcohol use Monitor closely for any withdrawals, continue CIWA  Mild AKI -Resolved  Mild hyponatremia, hypokalemia -Resolved  Facial pain Hold NSAIDs, continue Neurontin.  Code Status: Full CODE STATUS DVT Prophylaxis: SCDs Family Communication: Discussed all imaging results, lab results, explained to the patient, discussed with patient's daughter on the phone during encounter   Disposition Plan: Patient back  in rapid A. fib, started on flecainide.   Time Spent in minutes 35 minutes  Procedures:  2D echo  Consultants:   Cardiology  Antimicrobials:   Anti-infectives (From admission, onward)   Start     Dose/Rate Route Frequency Ordered Stop   01/24/19 0600  Ampicillin-Sulbactam (UNASYN) 3 g in sodium chloride 0.9 % 100 mL IVPB     3 g 200 mL/hr over 30 Minutes Intravenous Every 6 hours 01/24/19 0328     01/23/19 2130  Ampicillin-Sulbactam (UNASYN) 3 g in sodium chloride 0.9 % 100 mL IVPB     3 g 200 mL/hr over 30 Minutes Intravenous  Once 01/23/19 2123 01/24/19 0033         Medications  Scheduled Meds: . sodium chloride   Intravenous Once  . flecainide   300 mg Oral Once  . [START ON 01/26/2019] flecainide  75 mg Oral Q12H  . fluticasone furoate-vilanterol  1 puff Inhalation Daily  . folic acid  1 mg Oral Daily  . gabapentin  900 mg Oral TID  . metoprolol tartrate  12.5 mg Oral BID  . multivitamin with minerals  1 tablet Oral Daily  . pantoprazole  40 mg Oral BID AC  . potassium chloride  20 mEq Oral Once  . thiamine  100 mg Oral Daily   Or  . thiamine  100 mg Intravenous Daily  . umeclidinium bromide  1 puff Inhalation Daily   Continuous Infusions: . ampicillin-sulbactam (UNASYN) IV 3 g (01/25/19 1123)   PRN Meds:.acetaminophen **OR** acetaminophen, fentaNYL (SUBLIMAZE) injection, LORazepam **OR** LORazepam, ondansetron **OR** ondansetron (ZOFRAN) IV, polyvinyl alcohol      Subjective:   Brittany Oliver was seen and examined today.  At the time of my examination complaining of shortness of breath, on O2 2 L.  Was in normal sinus rhythm in the morning subsequently went back into rapid A. fib with heart rate in 150s, O2 sat 84%.  Afebrile.  Patient denies chest pain,  abdominal pain, N/V/D/C, new weakness, numbess, tingling.   Objective:   Vitals:   01/25/19 0742 01/25/19 1031 01/25/19 1245 01/25/19 1250  BP:  (!) 145/72 113/62   Pulse:  93 (!) 150   Resp:      Temp:      TempSrc:      SpO2: 93%  (!) 84% 93%  Weight:      Height:        Intake/Output Summary (Last 24 hours) at 01/25/2019 1338 Last data filed at 01/25/2019 0600 Gross per 24 hour  Intake 262.33 ml  Output -  Net 262.33 ml     Wt Readings from Last 3 Encounters:  01/23/19 58 kg  01/17/18 58.5 kg  01/17/17 57.6 kg   Physical Exam  General: Alert and oriented x 3, NAD  Eyes:   HEENT:  Atraumatic, normocephalic  Cardiovascular: Irregularly irregular, tachycardia.  Trace pedal edema b/l  Respiratory: Decreased breath sound at the bases  Gastrointestinal: Soft, nontender, nondistended, NBS  Ext: Trace pedal edema bilaterally  Neuro: no new  deficits  Musculoskeletal: No cyanosis, clubbing  Skin: No rashes  Psych: Normal affect and demeanor, alert and oriented x3     Data Reviewed:  I have personally reviewed following labs and imaging studies  Micro Results Recent Results (from the past 240 hour(s))  SARS CORONAVIRUS 2 (TAT 6-24 HRS) Nasopharyngeal Nasopharyngeal Swab     Status: None   Collection Time: 01/22/19 12:35 PM   Specimen: Nasopharyngeal Swab  Result Value  Ref Range Status   SARS Coronavirus 2 NEGATIVE NEGATIVE Final    Comment: (NOTE) SARS-CoV-2 target nucleic acids are NOT DETECTED. The SARS-CoV-2 RNA is generally detectable in upper and lower respiratory specimens during the acute phase of infection. Negative results do not preclude SARS-CoV-2 infection, do not rule out co-infections with other pathogens, and should not be used as the sole basis for treatment or other patient management decisions. Negative results must be combined with clinical observations, patient history, and epidemiological information. The expected result is Negative. Fact Sheet for Patients: SugarRoll.be Fact Sheet for Healthcare Providers: https://www.woods-mathews.com/ This test is not yet approved or cleared by the [place] FDA and  has been authorized for detection and/or diagnosis of SARS-CoV-2 by FDA under an Emergency Use Authorization (EUA). This EUA will remain  in effect (meaning this test can be used) for the duration of the COVID-19 declaration under Section 56 4(b)(1) of the Act, 21 U.S.C. section 360bbb-3(b)(1), unless the authorization is terminated or revoked sooner. Performed at [facility], [place] [address], [place], [place] [address]   Culture, blood (routine x 2)     Status: None (Preliminary result)   Collection Time: 01/23/19  6:52 PM   Specimen: BLOOD RIGHT ARM  Result Value Ref Range Status   Specimen Description   Final    BLOOD RIGHT ARM  Performed at [facility] [address], [place], [place] [address]    Special Requests   Final    BOTTLES DRAWN AEROBIC AND ANAEROBIC Blood Culture adequate volume Performed at [facility] [address] [place], [place] [address]    Culture   Final    NO GROWTH 2 DAYS Performed at [facility] [address] [place], [place] [address]    Report Status PENDING  Incomplete  Culture, blood (routine x 2)     Status: None (Preliminary result)   Collection Time: 01/23/19  6:57 PM   Specimen: BLOOD  Result Value Ref Range Status   Specimen Description   Final    BLOOD LEFT ANTECUBITAL Performed at [facility] [address] [place], [place] [address]    Special Requests   Final    BOTTLES DRAWN AEROBIC AND ANAEROBIC Blood Culture adequate volume Performed at [facility] [address], [place], [place] [address]    Culture   Final    NO GROWTH 2 DAYS Performed at [facility] [address] [place],  [address]    Report Status PENDING  Incomplete    Radiology Reports Dg Chest 1 View  Result Date: 01/23/2019 CLINICAL DATA:  Shortness of breath status post EGD EXAM: CHEST  1 VIEW COMPARISON:  12/30/2014 FINDINGS: Right lung is grossly clear. Patchy airspace opacities within the left mid and lower lung. No pleural effusion. Normal heart size. No pneumothorax. IMPRESSION: Moderate patchy airspace opacity in the left mid to lower lung, possibly representing pneumonia. Electronically Signed   By: Donavan Foil M.D.   On: 01/23/2019 20:16   Dg Abd 1 View  Result Date: 01/23/2019 CLINICAL DATA:  Shortness of breath after EGD EXAM: ABDOMEN - 1 VIEW COMPARISON:  None. FINDINGS: Patchy airspace disease left base. Mild diffuse increased bowel gas without obstructive pattern. No radiopaque calculi. Scoliosis and degenerative changes of the spine IMPRESSION: Nonobstructed bowel-gas pattern  Electronically Signed   By: Donavan Foil M.D.   On: 01/23/2019 20:17   Dg Chest [facility] 1 [address]  Result Date: 01/25/2019 CLINICAL DATA:  Shortness of breath EXAM: PORTABLE CHEST 1 VIEW COMPARISON:  Chest radiograph 01/23/2019 FINDINGS: Monitoring leads overlie the patient. Stable cardiac and mediastinal contours. Slight interval improvement left mid lower lung consolidation. New right lower lung consolidation. Small left greater than right bilateral pleural effusions. IMPRESSION: 1. Slight interval improvement left mid lower lung consolidation. 2. New right lower lung consolidation concerning for pneumonia or aspiration. 3. Small left greater than right pleural effusions. Electronically Signed   By: Lovey Newcomer M.D.   On: 01/25/2019 09:39    Lab Data:  CBC: Recent Labs  Lab 01/22/19 1131  01/23/19 0609  01/23/19 1946 01/23/19 2246 01/24/19 0153 01/24/19 0925 01/25/19 0429  WBC 9.6  --  4.8  --  7.1  --   --  12.6* 8.5  HGB 9.0*   < > 8.1*   < > 8.4* 7.7* 7.3* 8.1* 9.8*  HCT 28.2*   < > 25.2*   < > 26.9* 24.5* 23.8* 27.1* 31.8*  MCV 96.6  --  96.6  --  97.8  --   --  102.3* 99.7  PLT 261  --  128*  --  135*  --   --  154 150   < > = values in this interval not displayed.   Basic Metabolic Panel: Recent Labs  Lab 01/22/19 1840 01/23/19 0609 01/23/19 1949 01/24/19 0521 01/24/19 0925 01/25/19 0429  NA 138 141 139  --  139 138  K 4.3 3.8 3.2* 4.0 4.5 3.7  CL 107 110 108  --  111 109  CO2 24 25 24   --  19* 23  GLUCOSE 112* 118* 127*  --  118* 118*  BUN 42* 27* 18  --  14 11  CREATININE 0.86 0.81 0.71  --  0.79 0.80  CALCIUM 8.5* 8.0* 8.0*  --  7.6* 8.0*  MG  --   --  1.6* 2.4 2.2  --   PHOS  --   --  1.7* 3.9  --   --    GFR: Estimated Creatinine Clearance: 46.6 mL/min (by C-G formula based on SCr of 0.8 mg/dL). Liver Function Tests: Recent Labs  Lab 01/22/19 1131 01/22/19 1840 01/23/19 0609  AST 19 15 12*  ALT 18 15 11   ALKPHOS 43 35* 33*  BILITOT 0.5 0.7 0.9  PROT  6.9 5.8* 5.2*  ALBUMIN 4.3 3.7 3.1*   Recent Labs  Lab 01/22/19 1131  LIPASE 26   No results for input(s): AMMONIA in the last 168 hours. Coagulation Profile: No results for input(s): INR, PROTIME in the last 168 hours. Cardiac Enzymes: No results for input(s): CKTOTAL, CKMB, CKMBINDEX, TROPONINI in the last 168 hours. BNP (last 3 results) No results for input(s): PROBNP in the last 8760 hours. HbA1C: No results for input(s): HGBA1C in the last 72 hours. CBG: No results for input(s): GLUCAP in the last 168 hours. Lipid Profile: No results for input(s): CHOL, HDL, LDLCALC, TRIG, CHOLHDL, LDLDIRECT in the last 72 hours. Thyroid Function Tests: No results for input(s): TSH, T4TOTAL, FREET4, T3FREE, THYROIDAB in the last 72 hours. Anemia Panel: No results for input(s): VITAMINB12, FOLATE, FERRITIN, TIBC, IRON, RETICCTPCT in the last 72 hours. Urine analysis:    Component Value Date/Time   COLORURINE YELLOW 01/23/2019 1815   APPEARANCEUR HAZY (A) 01/23/2019 1815   LABSPEC 1.017 01/23/2019 1815   PHURINE 6.0 01/23/2019 1815   GLUCOSEU NEGATIVE 01/23/2019 1815   HGBUR NEGATIVE 01/23/2019 1815   BILIRUBINUR NEGATIVE  01/23/2019 [place] 01/23/2019 1815   PROTEINUR 30 (A) 01/23/2019 1815   NITRITE NEGATIVE 01/23/2019 1815   LEUKOCYTESUR SMALL (A) 01/23/2019 1815     Brielyn Bosak M.D. [facility] 01/25/2019, 1:38 PM  Pager: [phone] Between 7am to 7pm - call Pager - [phone]  After 7pm go to [email or web address] - password [email or web address]  Call night coverage person covering after 7pm            [facility]                                                                              Patient Demographics  Brittany Oliver, is a 78 y.o. female, DOB - 12/22/1940, MHD:[id number]  Admit date - 01/22/2019   Admitting Physician Elmarie Shiley, MD  Outpatient Primary MD for the patient is [name], MD  Outpatient specialists:   LOS - 2  days    Medical records reviewed and are as summarized below:    Chief Complaint  Patient presents with  . Emesis  . Diarrhea       Brief summary   78 y.o.femalepast medical history significant for stroke, hypertension, hyperlipidemia, history of skin melanoma who presents  With c/o nausea vomiting, coffee-ground emesis episodes and also associated with diarrhea/ melena in the setting daily alcohol, ASA and NSAID use. Last colonoscopy 2007 showed some hyperplastic polyps. No prior endoscopy.  Hemoglobin on admission 9.0.  Patient was admitted for further work-up  Assessment & Plan    Principal problem Acute blood loss anemia with upper GI bleed -Patient was placed on PPI IV drip.  Hemoglobin 9.0-> 7.2, received 1 unit of packed RBCs. -GI was consulted, underwent EGD which showed nonbleeding gastric ulcers with no stigmata of bleeding, highly likely source of patient's recent bleeding, NSAIDs versus H. pylori most likely etiologies.  Normal duodenum. -Hemoglobin 7.3 this morning, ordered 1 unit of packed RBC transfusion but repeat H&H this morning however showed hemoglobin of 8.1  Paroxysmal atrial fibrillation with RVR-new diagnosis - Overnight patient converted to atrial fibrillation with RVR, heart rate in 150s.  Was placed on beta-blocker -This a.m., heart rate still not controlled, in 120s, gave 1 dose of IV Cardizem 20 mg, patient converted from A. fib to normal sinus rhythm however had a 4.9-second pause, BP plummeted down to 77/54 and heart rate down to 50s.  Patient received IV fluid bolus to support. -Cardiology was consulted, recommended lower Lopressor dose, not anticoagulation candidate due to GI bleeding -2D echo showed EF of 60 to 65% with no LVH  Left lung pneumonia -Post ED EGD, patient was found to have shortness of breath possibly could be from A. fib with RVR however chest x-ray showed moderate patchy airspace opacity in the left mid to lower lung. -Leukocytosis,  overnight with fevers, 101.5 F.  Overnight was tachycardiac, hypotensive, met sepsis criteria  -Continue IV Unasyn  History of CVA Holding aspirin at this time, now has new paroxysmal A. fib, no AC due to GI bleed  History of alcohol use Monitor closely for any withdrawals, continue CIWA  Mild AKI -Resolved  Mild hyponatremia, hypokalemia -Currently stable  Facial pain Hold NSAIDs, continue Neurontin.  Code Status:  Full CODE STATUS DVT Prophylaxis: SCDs Family Communication: Discussed all imaging results, lab results, explained to the patient   Disposition Plan: Hopefully next 24 to 48 hours  Time Spent in minutes 35 minutes  Procedures:  2D echo  Consultants:   Cardiology  Antimicrobials:   Anti-infectives (From admission, onward)   Start     Dose/Rate Route Frequency Ordered Stop   01/24/19 0600  Ampicillin-Sulbactam (UNASYN) 3 g in sodium chloride 0.9 % 100 mL IVPB     3 g 200 mL/hr over 30 Minutes Intravenous Every 6 hours 01/24/19 0328     01/23/19 2130  Ampicillin-Sulbactam (UNASYN) 3 g in sodium chloride 0.9 % 100 mL IVPB     3 g 200 mL/hr over 30 Minutes Intravenous  Once 01/23/19 2123 01/24/19 0033         Medications  Scheduled Meds: . sodium chloride   Intravenous Once  . flecainide  300 mg Oral Once  . [START ON 01/26/2019] flecainide  75 mg Oral Q12H  . fluticasone furoate-vilanterol  1 puff Inhalation Daily  . folic acid  1 mg Oral Daily  . gabapentin  900 mg Oral TID  . metoprolol tartrate  12.5 mg Oral BID  . multivitamin with minerals  1 tablet Oral Daily  . pantoprazole  40 mg Oral BID AC  . potassium chloride  20 mEq Oral Once  . thiamine  100 mg Oral Daily   Or  . thiamine  100 mg Intravenous Daily  . umeclidinium bromide  1 puff Inhalation Daily   Continuous Infusions: . ampicillin-sulbactam (UNASYN) IV 3 g (01/25/19 1123)   PRN Meds:.acetaminophen **OR** acetaminophen, fentaNYL (SUBLIMAZE) injection, LORazepam **OR**  LORazepam, ondansetron **OR** ondansetron (ZOFRAN) IV, polyvinyl alcohol      Subjective:   Brittany Oliver was seen and examined today.  Overnight events noted, this morning during my encounter still in A. fib fibrillation with RVR with a heart rate in 120s.  Afebrile this morning, overnight spiking temp.  Patient denies  chest pain, shortness of breath, abdominal pain, N/V/D/C, new weakness, numbess, tingling.   Objective:   Vitals:   01/25/19 0742 01/25/19 1031 01/25/19 1245 01/25/19 1250  BP:  (!) 145/72 113/62   Pulse:  93 (!) 150   Resp:      Temp:      TempSrc:      SpO2: 93%  (!) 84% 93%  Weight:      Height:        Intake/Output Summary (Last 24 hours) at 01/25/2019 1343 Last data filed at 01/25/2019 0600 Gross per 24 hour  Intake 262.33 ml  Output -  Net 262.33 ml     Wt Readings from Last 3 Encounters:  01/23/19 58 kg  01/17/18 58.5 kg  01/17/17 57.6 kg     Exam  General: Alert and oriented x 3, NAD  Eyes:   HEENT:  Atraumatic, normocephalic, normal oropharynx  Cardiovascular: Irregularly irregular, tachycardia  Respiratory: Clear to auscultation bilaterally, no wheezing, rales or rhonchi  Gastrointestinal: Soft, nontender, nondistended, + bowel sounds  Ext: no pedal edema bilaterally  Neuro: No new FND's  Musculoskeletal: No digital cyanosis, clubbing  Skin: No rashes  Psych: Normal affect and demeanor, alert and oriented x3    Data Reviewed:  I have personally reviewed following labs and imaging studies  Micro Results Recent Results (from the past 240 hour(s))  SARS CORONAVIRUS 2 (TAT 6-24 HRS) Nasopharyngeal Nasopharyngeal Swab     Status: None   Collection  Time: 01/22/19 12:35 PM   Specimen: Nasopharyngeal Swab  Result Value Ref Range Status   SARS Coronavirus 2 NEGATIVE NEGATIVE Final    Comment: (NOTE) SARS-CoV-2 target nucleic acids are NOT DETECTED. The SARS-CoV-2 RNA is generally detectable in upper and lower respiratory  specimens during the acute phase of infection. Negative results do not preclude SARS-CoV-2 infection, do not rule out co-infections with other pathogens, and should not be used as the sole basis for treatment or other patient management decisions. Negative results must be combined with clinical observations, patient history, and epidemiological information. The expected result is Negative. Fact Sheet for Patients: SugarRoll.be Fact Sheet for Healthcare Providers: https://www.woods-mathews.com/ This test is not yet approved or cleared by the [place] FDA and  has been authorized for detection and/or diagnosis of SARS-CoV-2 by FDA under an Emergency Use Authorization (EUA). This EUA will remain  in effect (meaning this test can be used) for the duration of the COVID-19 declaration under Section 56 4(b)(1) of the Act, 21 U.S.C. section 360bbb-3(b)(1), unless the authorization is terminated or revoked sooner. Performed at [facility], [place] [address] [place], [place] [address]   Culture, blood (routine x 2)     Status: None (Preliminary result)   Collection Time: 01/23/19  6:52 PM   Specimen: BLOOD RIGHT ARM  Result Value Ref Range Status   Specimen Description   Final    BLOOD RIGHT ARM Performed at [facility] [address] [place], [place] [address]    Special Requests   Final    BOTTLES DRAWN AEROBIC AND ANAEROBIC Blood Culture adequate volume Performed at [facility] [address] [place], [place] [address]    Culture   Final    NO GROWTH 2 DAYS Performed at [facility] [address], [place], [place] [address]    Report Status PENDING  Incomplete  Culture, blood (routine x 2)     Status: None (Preliminary result)   Collection Time: 01/23/19  6:57 PM   Specimen: BLOOD  Result Value Ref Range Status   Specimen Description   Final    BLOOD LEFT ANTECUBITAL Performed  at [facility] [address], [place], [place] [address]    Special Requests   Final    BOTTLES DRAWN AEROBIC AND ANAEROBIC Blood Culture adequate volume Performed at [facility] [address] [place], [place] [address]    Culture   Final    NO GROWTH 2 DAYS Performed at [facility] [address], [place],  [address]    Report Status PENDING  Incomplete    Radiology Reports Dg Chest 1 View  Result Date: 01/23/2019 CLINICAL DATA:  Shortness of breath status post EGD EXAM: CHEST  1 VIEW COMPARISON:  12/30/2014 FINDINGS: Right lung is grossly clear. Patchy airspace opacities within the left mid and lower lung. No pleural effusion. Normal heart size. No pneumothorax. IMPRESSION: Moderate patchy airspace opacity in the left mid to lower lung, possibly representing pneumonia. Electronically Signed   By: Donavan Foil M.D.   On: 01/23/2019 20:16   Dg Abd 1 View  Result Date: 01/23/2019 CLINICAL DATA:  Shortness of breath after EGD EXAM: ABDOMEN - 1 VIEW COMPARISON:  None. FINDINGS: Patchy airspace disease left base. Mild diffuse increased bowel gas without obstructive pattern. No radiopaque calculi. Scoliosis and degenerative changes of the spine IMPRESSION: Nonobstructed bowel-gas pattern Electronically Signed   By: Madie Reno.D.  On: 01/23/2019 20:17   Dg Chest Port 1 View  Result Date: 01/25/2019 CLINICAL DATA:  Shortness of breath EXAM: PORTABLE CHEST 1 VIEW COMPARISON:  Chest radiograph 01/23/2019 FINDINGS: Monitoring leads overlie the patient. Stable cardiac and mediastinal contours. Slight interval improvement left mid lower lung consolidation. New right lower lung consolidation. Small left greater than right bilateral pleural effusions. IMPRESSION: 1. Slight interval improvement left mid lower lung consolidation. 2. New right lower lung consolidation concerning for pneumonia or aspiration. 3. Small left greater than right  pleural effusions. Electronically Signed   By: Lovey Newcomer M.D.   On: 01/25/2019 09:39    Lab Data:  CBC: Recent Labs  Lab 01/22/19 1131  01/23/19 0609  01/23/19 1946 01/23/19 2246 01/24/19 0153 01/24/19 0925 01/25/19 0429  WBC 9.6  --  4.8  --  7.1  --   --  12.6* 8.5  HGB 9.0*   < > 8.1*   < > 8.4* 7.7* 7.3* 8.1* 9.8*  HCT 28.2*   < > 25.2*   < > 26.9* 24.5* 23.8* 27.1* 31.8*  MCV 96.6  --  96.6  --  97.8  --   --  102.3* 99.7  PLT 261  --  128*  --  135*  --   --  154 150   < > = values in this interval not displayed.   Basic Metabolic Panel: Recent Labs  Lab 01/22/19 1840 01/23/19 0609 01/23/19 1949 01/24/19 0521 01/24/19 0925 01/25/19 0429  NA 138 141 139  --  139 138  K 4.3 3.8 3.2* 4.0 4.5 3.7  CL 107 110 108  --  111 109  CO2 24 25 24   --  19* 23  GLUCOSE 112* 118* 127*  --  118* 118*  BUN 42* 27* 18  --  14 11  CREATININE 0.86 0.81 0.71  --  0.79 0.80  CALCIUM 8.5* 8.0* 8.0*  --  7.6* 8.0*  MG  --   --  1.6* 2.4 2.2  --   PHOS  --   --  1.7* 3.9  --   --    GFR: Estimated Creatinine Clearance: 46.6 mL/min (by C-G formula based on SCr of 0.8 mg/dL). Liver Function Tests: Recent Labs  Lab 01/22/19 1131 01/22/19 1840 01/23/19 0609  AST 19 15 12*  ALT 18 15 11   ALKPHOS 43 35* 33*  BILITOT 0.5 0.7 0.9  PROT 6.9 5.8* 5.2*  ALBUMIN 4.3 3.7 3.1*   Recent Labs  Lab 01/22/19 1131  LIPASE 26   No results for input(s): AMMONIA in the last 168 hours. Coagulation Profile: No results for input(s): INR, PROTIME in the last 168 hours. Cardiac Enzymes: No results for input(s): CKTOTAL, CKMB, CKMBINDEX, TROPONINI in the last 168 hours. BNP (last 3 results) No results for input(s): PROBNP in the last 8760 hours. HbA1C: No results for input(s): HGBA1C in the last 72 hours. CBG: No results for input(s): GLUCAP in the last 168 hours. Lipid Profile: No results for input(s): CHOL, HDL, LDLCALC, TRIG, CHOLHDL, LDLDIRECT in the last 72 hours. Thyroid Function  Tests: No results for input(s): TSH, T4TOTAL, FREET4, T3FREE, THYROIDAB in the last 72 hours. Anemia Panel: No results for input(s): VITAMINB12, FOLATE, FERRITIN, TIBC, IRON, RETICCTPCT in the last 72 hours. Urine analysis:    Component Value Date/Time   COLORURINE YELLOW 01/23/2019 1815   APPEARANCEUR HAZY (A) 01/23/2019 1815   LABSPEC 1.017 01/23/2019 1815   PHURINE 6.0 01/23/2019 1815   GLUCOSEU NEGATIVE  01/23/2019 [place] 01/23/2019 1815   BILIRUBINUR NEGATIVE 01/23/2019 [place] 01/23/2019 1815   PROTEINUR 30 (A) 01/23/2019 1815   NITRITE NEGATIVE 01/23/2019 1815   LEUKOCYTESUR SMALL (A) 01/23/2019 1815     Galan Ghee M.D. [facility] 01/25/2019, 1:43 PM  Pager: [phone] Between 7am to 7pm - call Pager - [phone]  After 7pm go to [email or web address] - password [email or web address]  Call night coverage person covering after 7pm

## 2019-01-25 NOTE — Progress Notes (Signed)
RN at bedside to evaluate for home oxygen and noted on telemetry monitor that her HR was in the 150's. Mostly sustaining 120-130s. EKG confirmed Afib with RVR. Patient asymptomatic. O2 sat on RA at this time 84%. Placed back on 2L oxygen via nasal cannula. Dr. Tana Coast made aware of changes and paged Dr. Johnsie Cancel as well per her request. Orders received for Digoxin. Will give per MD order and monitor patient's response.   Brittany Oliver, Janett Billow L 01/25/2019 1:10 PM

## 2019-01-26 ENCOUNTER — Telehealth: Payer: Self-pay | Admitting: Cardiology

## 2019-01-26 ENCOUNTER — Inpatient Hospital Stay (HOSPITAL_COMMUNITY): Payer: Medicare Other

## 2019-01-26 ENCOUNTER — Telehealth: Payer: Self-pay

## 2019-01-26 DIAGNOSIS — J9601 Acute respiratory failure with hypoxia: Secondary | ICD-10-CM

## 2019-01-26 DIAGNOSIS — J440 Chronic obstructive pulmonary disease with acute lower respiratory infection: Secondary | ICD-10-CM

## 2019-01-26 DIAGNOSIS — J690 Pneumonitis due to inhalation of food and vomit: Secondary | ICD-10-CM

## 2019-01-26 DIAGNOSIS — I4891 Unspecified atrial fibrillation: Secondary | ICD-10-CM

## 2019-01-26 DIAGNOSIS — J810 Acute pulmonary edema: Secondary | ICD-10-CM

## 2019-01-26 LAB — CBC
HCT: 32.9 % — ABNORMAL LOW (ref 36.0–46.0)
Hemoglobin: 10.1 g/dL — ABNORMAL LOW (ref 12.0–15.0)
MCH: 30.7 pg (ref 26.0–34.0)
MCHC: 30.7 g/dL (ref 30.0–36.0)
MCV: 100 fL (ref 80.0–100.0)
Platelets: 180 10*3/uL (ref 150–400)
RBC: 3.29 MIL/uL — ABNORMAL LOW (ref 3.87–5.11)
RDW: 15 % (ref 11.5–15.5)
WBC: 8.3 10*3/uL (ref 4.0–10.5)
nRBC: 0 % (ref 0.0–0.2)

## 2019-01-26 LAB — BASIC METABOLIC PANEL
Anion gap: 8 (ref 5–15)
BUN: 13 mg/dL (ref 8–23)
CO2: 25 mmol/L (ref 22–32)
Calcium: 8.5 mg/dL — ABNORMAL LOW (ref 8.9–10.3)
Chloride: 108 mmol/L (ref 98–111)
Creatinine, Ser: 0.87 mg/dL (ref 0.44–1.00)
GFR calc Af Amer: >60 mL/min (ref >60)
GFR calc non Af Amer: >60 mL/min (ref >60)
Glucose, Bld: 127 mg/dL — ABNORMAL HIGH (ref 70–99)
Potassium: 4.1 mmol/L (ref 3.5–5.1)
Sodium: 141 mmol/L (ref 135–145)

## 2019-01-26 MED ORDER — IPRATROPIUM BROMIDE 0.02 % IN SOLN
0.5000 mg | RESPIRATORY_TRACT | Status: DC
  Administered 2019-01-26: 0.5 mg via RESPIRATORY_TRACT
  Filled 2019-01-26: qty 2.5

## 2019-01-26 MED ORDER — FUROSEMIDE 10 MG/ML IJ SOLN
40.0000 mg | Freq: Two times a day (BID) | INTRAMUSCULAR | Status: DC
  Administered 2019-01-26 – 2019-01-27 (×2): 40 mg via INTRAVENOUS
  Filled 2019-01-26 (×2): qty 4

## 2019-01-26 MED ORDER — FUROSEMIDE 10 MG/ML IJ SOLN
40.0000 mg | Freq: Every day | INTRAMUSCULAR | Status: DC
  Administered 2019-01-26: 40 mg via INTRAVENOUS
  Filled 2019-01-26: qty 4

## 2019-01-26 MED ORDER — METHYLPREDNISOLONE SODIUM SUCC 40 MG IJ SOLR
40.0000 mg | Freq: Two times a day (BID) | INTRAMUSCULAR | Status: DC
  Administered 2019-01-26 – 2019-01-28 (×4): 40 mg via INTRAVENOUS
  Filled 2019-01-26 (×4): qty 1

## 2019-01-26 MED ORDER — IPRATROPIUM BROMIDE 0.02 % IN SOLN: 0.5000 mg | Freq: Three times a day (TID) | RESPIRATORY_TRACT | Status: DC

## 2019-01-26 MED ORDER — LEVALBUTEROL HCL 0.63 MG/3ML IN NEBU: 0.6300 mg | INHALATION_SOLUTION | RESPIRATORY_TRACT | Status: DC | PRN

## 2019-01-26 MED ORDER — METOPROLOL TARTRATE 25 MG PO TABS
25.0000 mg | ORAL_TABLET | Freq: Two times a day (BID) | ORAL | Status: DC
  Administered 2019-01-26 – 2019-01-28 (×4): 25 mg via ORAL
  Filled 2019-01-26 (×4): qty 1

## 2019-01-26 MED ORDER — LEVALBUTEROL HCL 0.63 MG/3ML IN NEBU
0.6300 mg | INHALATION_SOLUTION | RESPIRATORY_TRACT | Status: DC
  Administered 2019-01-26: 0.63 mg via RESPIRATORY_TRACT
  Filled 2019-01-26: qty 3

## 2019-01-26 MED ORDER — METHYLPREDNISOLONE SODIUM SUCC 125 MG IJ SOLR
125.0000 mg | Freq: Once | INTRAMUSCULAR | Status: AC
  Administered 2019-01-26: 125 mg via INTRAVENOUS
  Filled 2019-01-26: qty 2

## 2019-01-26 MED ORDER — LEVALBUTEROL HCL 0.63 MG/3ML IN NEBU
0.6300 mg | INHALATION_SOLUTION | Freq: Three times a day (TID) | RESPIRATORY_TRACT | Status: DC
  Administered 2019-01-27: 0.63 mg via RESPIRATORY_TRACT
  Filled 2019-01-26: qty 3

## 2019-01-26 MED ORDER — IPRATROPIUM BROMIDE 0.02 % IN SOLN
0.5000 mg | Freq: Four times a day (QID) | RESPIRATORY_TRACT | Status: DC
  Administered 2019-01-26 (×2): 0.5 mg via RESPIRATORY_TRACT
  Filled 2019-01-26 (×2): qty 2.5

## 2019-01-26 MED ORDER — AMIODARONE HCL 200 MG PO TABS
400.0000 mg | ORAL_TABLET | Freq: Two times a day (BID) | ORAL | Status: DC
  Administered 2019-01-26 – 2019-01-28 (×5): 400 mg via ORAL
  Filled 2019-01-26 (×5): qty 2

## 2019-01-26 MED ORDER — LEVALBUTEROL HCL 0.63 MG/3ML IN NEBU
0.6300 mg | INHALATION_SOLUTION | Freq: Four times a day (QID) | RESPIRATORY_TRACT | Status: DC
  Administered 2019-01-26 (×2): 0.63 mg via RESPIRATORY_TRACT
  Filled 2019-01-26 (×2): qty 3

## 2019-01-26 NOTE — Telephone Encounter (Signed)
New Message:     I left a message for pt to call and schedule an Echo an a [facility] F/U appointment with one of our Provider

## 2019-01-26 NOTE — Progress Notes (Deleted)
Patient given Lasix 40 mg IV per MAR at 0919. Patient has only voided about 125cc of yellow urine since 7a. Bladder scan performed and only shows <50cc of urine in the bladder. Dr. Rai paged to make aware of low urine output.    Patient has also been lethargic today after starting patient on BID OxyContin last night. Patient received 2 does thus far. Dr. Freeman made aware of this via in person conversation.  Will continue to monitor patient and carry out any new orders if written.   Brittany Oliver L 01/26/2019 2:51 PM 

## 2019-01-26 NOTE — Progress Notes (Addendum)
RN notified by telemetry monitoring that the patient has converted back into Afib with HR 115-130's. Patient asymptomatic. Per Cardiology note this AM they were already aware of this during their rounds. Patient just received newly prescribed Amiodarone PO. Will continue to monitor HR.   This RN has worked with this patient the past two days and it has been observed that the patient was in NSR both mornings. The pt received IV lasix both days per orders and both days within about 2 hours after the Lasix injection she converted to back to Afib. Unsure if this is related to the medication or just coincidental.   [place] Masaki, Janett Billow L 01/26/2019 11:41 AM

## 2019-01-26 NOTE — Progress Notes (Signed)
[facility]                                                                              Patient Demographics  Brittany Oliver, is a 78 y.o. female, DOB - February 12, 1941, HFS:[id number]  Admit date - 01/22/2019   Admitting Physician Elmarie Shiley, MD  Outpatient Primary MD for the patient is [name], MD  Outpatient specialists:   LOS - 3  days   Medical records reviewed and are as summarized below:    Chief Complaint  Patient presents with   Emesis   Diarrhea       Brief summary   78 y.o.femalepast medical history significant for stroke, hypertension, hyperlipidemia, history of skin melanoma who presents  With c/o nausea vomiting, coffee-ground emesis episodes and also associated with diarrhea/ melena in the setting daily alcohol, ASA and NSAID use. Last colonoscopy 2007 showed some hyperplastic polyps. No prior endoscopy.  Hemoglobin on admission 9.0.  Patient was admitted for further work-up  Assessment & Plan    Principal problem Acute blood loss anemia with upper GI bleed -Patient was placed on PPI IV drip.  Hemoglobin 9.0-> 7.2, received 1 unit of packed RBCs. -GI was consulted, underwent EGD which showed nonbleeding gastric ulcers with no stigmata of bleeding, highly likely source of patient's recent bleeding, NSAIDs versus H. pylori most likely etiologies.  Normal duodenum. -H&H currently stable, 10.1.  Had received 1 unit of packed RBC transfusion on 11/7.  Paroxysmal atrial fibrillation with RVR-new diagnosis -Patient had converted to A. fib with RVR on 11/7 overnight, was placed on beta-blocker.  BP had plummeted down with 1 dose of IV Cardizem.  Cardiology was consulted and recommended lower dose of Lopressor.  Not an anticoagulation candidate due to GI bleeding. -2D echo showed EF of 60 to 65% with no LVH -Patient in and out of atrial fibrillation with RVR, was started on flecainide on 11/8, today dyspneic and diffusely wheezing,  stat chest x-ray showed bilateral pleural effusions.  Patient had received Lasix on 11/8.  Started on amiodarone today by cardiology.  Mild acute diastolic CHF likely due to volume overload, atrial fibrillation with RVR, IV fluids - Had received Lasix 20 mg IV x1 on 11/8 -This morning, dyspneic, O2 sats 91% on 5 L, diffusely wheezing.  Stat chest x-ray showed bilateral airspace disease within the mid to lower lungs, bilateral pleural effusions -Started on Lasix 40 mg IV daily, seen by cardiology, increased to every 12 hours -Significantly improved output 4.3 L positive balance this morning, after Lasix--> 1.9 L +   Left lung pneumonia, COPD exacerbation -Post ED EGD, patient was found to have shortness of breath possibly could be from A. fib with RVR however chest x-ray showed moderate patchy airspace opacity in the left mid to lower lung. -Continue IV Unasyn -Per daughter, patient follows pulmonology, Dr. Lenna Gilford, requested pulmonology consult. -Placed on scheduled nebs with Xopenex and Atrovent, flutter valve, given Solu-Medrol 125 mg IV x1, placed on 40 mg every 12 hours.  Continue Breo  History of CVA Holding aspirin at this time, now has new paroxysmal A. fib, no AC due to GI  bleed  History of alcohol use Monitor closely for any withdrawals, continue CIWA  Mild AKI Follow creatinine function closely with diuresis  Mild hyponatremia, hypokalemia Resolved  Facial pain Hold NSAIDs, continue Neurontin.  Code Status: Full CODE STATUS DVT Prophylaxis: SCDs Family Communication: Discussed all imaging results, lab results, explained to the patient, discussed with patient's daughter on 11/8   Disposition Plan: Patient back in rapid A. fib, started on flecainide.   Time Spent in minutes 35 minutes  Procedures:  2D echo  Consultants:   Cardiology  Antimicrobials:   Anti-infectives (From admission, onward)   Start     Dose/Rate Route Frequency Ordered Stop   01/24/19 0600   Ampicillin-Sulbactam (UNASYN) 3 g in sodium chloride 0.9 % 100 mL IVPB     3 g 200 mL/hr over 30 Minutes Intravenous Every 6 hours 01/24/19 0328     01/23/19 2130  Ampicillin-Sulbactam (UNASYN) 3 g in sodium chloride 0.9 % 100 mL IVPB     3 g 200 mL/hr over 30 Minutes Intravenous  Once 01/23/19 2123 01/24/19 0033         Medications  Scheduled Meds:  sodium chloride   Intravenous Once   amiodarone  400 mg Oral BID   fluticasone furoate-vilanterol  1 puff Inhalation Daily   folic acid  1 mg Oral Daily   furosemide  40 mg Intravenous BID   gabapentin  900 mg Oral TID   levalbuterol  0.63 mg Nebulization Q6H   And   ipratropium  0.5 mg Nebulization Q6H   methylPREDNISolone (SOLU-MEDROL) injection  40 mg Intravenous Q12H   metoprolol tartrate  25 mg Oral BID   multivitamin with minerals  1 tablet Oral Daily   pantoprazole  40 mg Oral BID AC   thiamine  100 mg Oral Daily   Or   thiamine  100 mg Intravenous Daily   umeclidinium bromide  1 puff Inhalation Daily   Continuous Infusions:  ampicillin-sulbactam (UNASYN) IV 3 g (01/26/19 1127)   PRN Meds:.acetaminophen **OR** acetaminophen, fentaNYL (SUBLIMAZE) injection, levalbuterol, ondansetron **OR** ondansetron (ZOFRAN) IV, polyvinyl alcohol      Subjective:   Brittany Oliver was seen and examined today.  At the time of my examination, short of breath and diffusely wheezing on O2 5 L.  No chest pain.  No nausea vomiting, dizziness or lightheadedness.    Objective:   Vitals:   01/26/19 1019 01/26/19 1025 01/26/19 1127 01/26/19 1229  BP: (!) 130/103     Pulse: (!) 116     Resp: 19     Temp:      TempSrc:      SpO2: 97% 94% 95% 97%  Weight:      Height:        Intake/Output Summary (Last 24 hours) at 01/26/2019 1417 Last data filed at 01/26/2019 1229 Gross per 24 hour  Intake 720 ml  Output 3300 ml  Net -2580 ml     Wt Readings from Last 3 Encounters:  01/23/19 58 kg  01/17/18 58.5 kg    01/17/17 57.6 kg   Physical Exam  General: Alert and oriented x 3, NAD  Eyes:   HEENT:  Atraumatic, normocephalic  Cardiovascular: Irregularly irregular, tachycardia  Respiratory: Bilateral diffuse expiratory wheezing  Gastrointestinal: Soft, nontender, nondistended, NBS  Ext: no pedal edema bilaterally  Neuro: no new deficits  Musculoskeletal: No cyanosis, clubbing  Skin: No rashes  Psych: Normal affect and demeanor, alert and oriented x3     Data Reviewed:  I have personally reviewed following labs and imaging studies  Micro Results Recent Results (from the past 240 hour(s))  SARS CORONAVIRUS 2 (TAT 6-24 HRS) Nasopharyngeal Nasopharyngeal Swab     Status: None   Collection Time: 01/22/19 12:35 PM   Specimen: Nasopharyngeal Swab  Result Value Ref Range Status   SARS Coronavirus 2 NEGATIVE NEGATIVE Final    Comment: (NOTE) SARS-CoV-2 target nucleic acids are NOT DETECTED. The SARS-CoV-2 RNA is generally detectable in upper and lower respiratory specimens during the acute phase of infection. Negative results do not preclude SARS-CoV-2 infection, do not rule out co-infections with other pathogens, and should not be used as the sole basis for treatment or other patient management decisions. Negative results must be combined with clinical observations, patient history, and epidemiological information. The expected result is Negative. Fact Sheet for Patients: SugarRoll.be Fact Sheet for Healthcare Providers: https://www.woods-mathews.com/ This test is not yet approved or cleared by the [place] FDA and  has been authorized for detection and/or diagnosis of SARS-CoV-2 by FDA under an Emergency Use Authorization (EUA). This EUA will remain  in effect (meaning this test can be used) for the duration of the COVID-19 declaration under Section 56 4(b)(1) of the Act, 21 U.S.C. section 360bbb-3(b)(1), unless the authorization  is terminated or revoked sooner. Performed at [facility], [place] [address], [place], [place] [address]   Culture, blood (routine x 2)     Status: None (Preliminary result)   Collection Time: 01/23/19  6:52 PM   Specimen: BLOOD RIGHT ARM  Result Value Ref Range Status   Specimen Description   Final    BLOOD RIGHT ARM Performed at [facility] [address], [place], [place] [address]    Special Requests   Final    BOTTLES DRAWN AEROBIC AND ANAEROBIC Blood Culture adequate volume Performed at [facility] [address] [place], [place] [address]    Culture   Final    NO GROWTH 3 DAYS Performed at [facility], [place] [address], [place], [place] [address]    Report Status PENDING  Incomplete  Culture, blood (routine x 2)     Status: None (Preliminary result)   Collection Time: 01/23/19  6:57 PM   Specimen: BLOOD  Result Value Ref Range Status   Specimen Description   Final    BLOOD LEFT ANTECUBITAL Performed at [facility] [address], [place], [place] [address]    Special Requests   Final    BOTTLES DRAWN AEROBIC AND ANAEROBIC Blood Culture adequate volume Performed at [facility] [address], [place], [place] [address]    Culture   Final    NO GROWTH 3 DAYS Performed at [facility], [place] [address], [place], [place] [address]    Report Status PENDING  Incomplete    Radiology Reports Dg Chest 1 View  Result Date: 01/23/2019 CLINICAL DATA:  Shortness of breath status post EGD EXAM: CHEST  1 VIEW COMPARISON:  12/30/2014 FINDINGS: Right lung is grossly clear. Patchy airspace opacities within the left mid and lower lung. No pleural effusion. Normal heart size. No pneumothorax. IMPRESSION: Moderate patchy airspace opacity in the left mid to lower lung, possibly representing pneumonia. Electronically Signed   By: Donavan Foil M.D.   On: 01/23/2019 20:16   Dg Abd 1  View  Result Date: 01/23/2019 CLINICAL DATA:  Shortness of breath after EGD EXAM: ABDOMEN - 1 VIEW COMPARISON:  None. FINDINGS: Patchy airspace disease left base. Mild diffuse increased bowel gas without obstructive pattern. No radiopaque calculi. Scoliosis and degenerative changes of the spine IMPRESSION: Nonobstructed bowel-gas pattern Electronically Signed   By: Donavan Foil M.D.   On: 01/23/2019 20:17   Dg Chest Port 1 View  Result Date: 01/26/2019 CLINICAL DATA:  Shortness of breath and weakness. EXAM: PORTABLE CHEST 1 VIEW COMPARISON:  Chest radiograph 01/25/2019 FINDINGS: Overlying monitoring leads. The cardiomediastinal silhouette is unchanged. Redemonstrated airspace disease within the bilateral mid to lower lungs as well as bilateral pleural effusions. The right-sided airspace disease and pleural effusion appear increased from prior exam. No evidence of pneumothorax. No acute bony abnormality. IMPRESSION: Bilateral airspace disease within the mid to lower lungs as well as bilateral pleural effusions, increased on the right as compared to prior exam. Electronically Signed   By: Kellie Simmering DO   On: 01/26/2019 08:55   Dg Chest Port 1 View  Result Date: 01/25/2019 CLINICAL DATA:  Shortness of breath EXAM: PORTABLE CHEST 1 VIEW COMPARISON:  Chest radiograph 01/23/2019 FINDINGS: Monitoring leads overlie the patient. Stable cardiac and mediastinal contours. Slight interval improvement left mid lower lung consolidation. New right lower lung consolidation. Small left greater than right bilateral pleural effusions. IMPRESSION: 1. Slight interval improvement left mid lower lung consolidation. 2. New right lower lung consolidation concerning for pneumonia or aspiration. 3. Small left greater than right pleural effusions. Electronically Signed   By: Lovey Newcomer M.D.   On: 01/25/2019 09:39    Lab Data:  CBC: Recent Labs  Lab 01/23/19 0609  01/23/19 1946 01/23/19 2246 01/24/19 0153 01/24/19 0925  01/25/19 0429 01/26/19 0339  WBC 4.8  --  7.1  --   --  12.6* 8.5 8.3  HGB 8.1*   < > 8.4* 7.7* 7.3* 8.1* 9.8* 10.1*  HCT 25.2*   < > 26.9* 24.5* 23.8* 27.1* 31.8* 32.9*  MCV 96.6  --  97.8  --   --  102.3* 99.7 100.0  PLT 128*  --  135*  --   --  154 150 180   < > = values in this interval not displayed.   Basic Metabolic Panel: Recent Labs  Lab 01/23/19 0609 01/23/19 1949 01/24/19 0521 01/24/19 0925 01/25/19 0429 01/26/19 0339  NA 141 139  --  139 138 141  K 3.8 3.2* 4.0 4.5 3.7 4.1  CL 110 108  --  111 109 108  CO2 25 24  --  19* 23 25  GLUCOSE 118* 127*  --  118* 118* 127*  BUN 27* 18  --  14 11 13   CREATININE 0.81 0.71  --  0.79 0.80 0.87  CALCIUM 8.0* 8.0*  --  7.6* 8.0* 8.5*  MG  --  1.6* 2.4 2.2  --   --   PHOS  --  1.7* 3.9  --   --   --    GFR: Estimated Creatinine Clearance: 42.8 mL/min (by C-G formula based on SCr of 0.87 mg/dL). Liver Function Tests: Recent Labs  Lab 01/22/19 1131 01/22/19 1840 01/23/19 0609  AST 19 15 12*  ALT 18 15 11   ALKPHOS 43 35* 33*  BILITOT 0.5 0.7 0.9  PROT 6.9 5.8* 5.2*  ALBUMIN 4.3 3.7 3.1*   Recent Labs  Lab 01/22/19 1131  LIPASE 26   No results for input(s): AMMONIA in the last 168 hours. Coagulation Profile: No results for input(s): INR, PROTIME in the last 168 hours. Cardiac Enzymes: No results for  input(s): CKTOTAL, CKMB, CKMBINDEX, TROPONINI in the last 168 hours. BNP (last 3 results) No results for input(s): PROBNP in the last 8760 hours. HbA1C: No results for input(s): HGBA1C in the last 72 hours. CBG: No results for input(s): GLUCAP in the last 168 hours. Lipid Profile: No results for input(s): CHOL, HDL, LDLCALC, TRIG, CHOLHDL, LDLDIRECT in the last 72 hours. Thyroid Function Tests: No results for input(s): TSH, T4TOTAL, FREET4, T3FREE, THYROIDAB in the last 72 hours. Anemia Panel: No results for input(s): VITAMINB12, FOLATE, FERRITIN, TIBC, IRON, RETICCTPCT in the last 72 hours. Urine analysis:      Component Value Date/Time   COLORURINE YELLOW 01/23/2019 1815   APPEARANCEUR HAZY (A) 01/23/2019 1815   LABSPEC 1.017 01/23/2019 1815   PHURINE 6.0 01/23/2019 1815   GLUCOSEU NEGATIVE 01/23/2019 1815   HGBUR NEGATIVE 01/23/2019 1815   [place] NEGATIVE 01/23/2019 [place] NEGATIVE 01/23/2019 1815   PROTEINUR 30 (A) 01/23/2019 1815   NITRITE NEGATIVE 01/23/2019 1815   LEUKOCYTESUR SMALL (A) 01/23/2019 1815     Lubna Stegeman M.D. [facility] 01/26/2019, 2:17 PM  Pager: [phone] Between 7am to 7pm - call Pager - [phone]  After 7pm go to [email or web address] - password [email or web address]  Call night coverage person covering after 7pm            [facility]                                                                              Patient Demographics  Brittany Oliver, is a 78 y.o. female, DOB - December 17, 1940, [id number]  Admit date - 01/22/2019   Admitting Physician Elmarie Shiley, MD  Outpatient Primary MD for the patient is [name], MD  Outpatient specialists:   LOS - 3  days   Medical records reviewed and are as summarized below:    Chief Complaint  Patient presents with   Emesis   Diarrhea       Brief summary   78 y.o.femalepast medical history significant for stroke, hypertension, hyperlipidemia, history of skin melanoma who presents  With c/o nausea vomiting, coffee-ground emesis episodes and also associated with diarrhea/ melena in the setting daily alcohol, ASA and NSAID use. Last colonoscopy 2007 showed some hyperplastic polyps. No prior endoscopy.  Hemoglobin on admission 9.0.  Patient was admitted for further work-up  Assessment & Plan    Principal problem Acute blood loss anemia with upper GI bleed -Patient was placed on PPI IV drip.  Hemoglobin 9.0-> 7.2, received 1 unit of packed RBCs. -GI was consulted, underwent EGD which showed nonbleeding gastric ulcers with no stigmata of bleeding, highly likely source of  patient's recent bleeding, NSAIDs versus H. pylori most likely etiologies.  Normal duodenum. -Hemoglobin 7.3 this morning, ordered 1 unit of packed RBC transfusion but repeat H&H this morning however showed hemoglobin of 8.1  Paroxysmal atrial fibrillation with RVR-new diagnosis - Overnight patient converted to atrial fibrillation with RVR, heart rate in 150s.  Was placed on beta-blocker -This a.m., heart rate still not controlled, in 120s, gave 1 dose of IV Cardizem 20 mg, patient converted from A. fib to normal sinus rhythm however had a 4.9-second pause, BP plummeted down to 77/54 and  heart rate down to 50s.  Patient received IV fluid bolus to support. -Cardiology was consulted, recommended lower Lopressor dose, not anticoagulation candidate due to GI bleeding -2D echo showed EF of 60 to 65% with no LVH  Left lung pneumonia -Post ED EGD, patient was found to have shortness of breath possibly could be from A. fib with RVR however chest x-ray showed moderate patchy airspace opacity in the left mid to lower lung. -Leukocytosis, overnight with fevers, 101.5 F.  Overnight was tachycardiac, hypotensive, met sepsis criteria  -Continue IV Unasyn  History of CVA Holding aspirin at this time, now has new paroxysmal A. fib, no AC due to GI bleed  History of alcohol use Monitor closely for any withdrawals, continue CIWA  Mild AKI -Resolved  Mild hyponatremia, hypokalemia -Currently stable  Facial pain Hold NSAIDs, continue Neurontin.  Code Status: Full CODE STATUS DVT Prophylaxis: SCDs Family Communication: Discussed all imaging results, lab results, explained to the patient   Disposition Plan: Hopefully next 24 to 48 hours  Time Spent in minutes 35 minutes  Procedures:  2D echo  Consultants:   Cardiology  Antimicrobials:   Anti-infectives (From admission, onward)   Start     Dose/Rate Route Frequency Ordered Stop   01/24/19 0600  Ampicillin-Sulbactam (UNASYN) 3 g in sodium  chloride 0.9 % 100 mL IVPB     3 g 200 mL/hr over 30 Minutes Intravenous Every 6 hours 01/24/19 0328     01/23/19 2130  Ampicillin-Sulbactam (UNASYN) 3 g in sodium chloride 0.9 % 100 mL IVPB     3 g 200 mL/hr over 30 Minutes Intravenous  Once 01/23/19 2123 01/24/19 0033         Medications  Scheduled Meds:  sodium chloride   Intravenous Once   amiodarone  400 mg Oral BID   fluticasone furoate-vilanterol  1 puff Inhalation Daily   folic acid  1 mg Oral Daily   furosemide  40 mg Intravenous BID   gabapentin  900 mg Oral TID   levalbuterol  0.63 mg Nebulization Q6H   And   ipratropium  0.5 mg Nebulization Q6H   methylPREDNISolone (SOLU-MEDROL) injection  40 mg Intravenous Q12H   metoprolol tartrate  25 mg Oral BID   multivitamin with minerals  1 tablet Oral Daily   pantoprazole  40 mg Oral BID AC   thiamine  100 mg Oral Daily   Or   thiamine  100 mg Intravenous Daily   umeclidinium bromide  1 puff Inhalation Daily   Continuous Infusions:  ampicillin-sulbactam (UNASYN) IV 3 g (01/26/19 1127)   PRN Meds:.acetaminophen **OR** acetaminophen, fentaNYL (SUBLIMAZE) injection, levalbuterol, ondansetron **OR** ondansetron (ZOFRAN) IV, polyvinyl alcohol      Subjective:   Brittany Oliver was seen and examined today.  Overnight events noted, this morning during my encounter still in A. fib fibrillation with RVR with a heart rate in 120s.  Afebrile this morning, overnight spiking temp.  Patient denies  chest pain, shortness of breath, abdominal pain, N/V/D/C, new weakness, numbess, tingling.   Objective:   Vitals:   01/26/19 1019 01/26/19 1025 01/26/19 1127 01/26/19 1229  BP: (!) 130/103     Pulse: (!) 116     Resp: 19     Temp:      TempSrc:      SpO2: 97% 94% 95% 97%  Weight:      Height:        Intake/Output Summary (Last 24 hours) at 01/26/2019 1417 Last data filed at  01/26/2019 1229 Gross per 24 hour  Intake 720 ml  Output 3300 ml  Net -2580 ml       Wt Readings from Last 3 Encounters:  01/23/19 58 kg  01/17/18 58.5 kg  01/17/17 57.6 kg     Exam  General: Alert and oriented x 3, NAD  Eyes:   HEENT:  Atraumatic, normocephalic, normal oropharynx  Cardiovascular: Irregularly irregular, tachycardia  Respiratory: Clear to auscultation bilaterally, no wheezing, rales or rhonchi  Gastrointestinal: Soft, nontender, nondistended, + bowel sounds  Ext: no pedal edema bilaterally  Neuro: No new FND's  Musculoskeletal: No digital cyanosis, clubbing  Skin: No rashes  Psych: Normal affect and demeanor, alert and oriented x3    Data Reviewed:  I have personally reviewed following labs and imaging studies  Micro Results Recent Results (from the past 240 hour(s))  SARS CORONAVIRUS 2 (TAT 6-24 HRS) Nasopharyngeal Nasopharyngeal Swab     Status: None   Collection Time: 01/22/19 12:35 PM   Specimen: Nasopharyngeal Swab  Result Value Ref Range Status   SARS Coronavirus 2 NEGATIVE NEGATIVE Final    Comment: (NOTE) SARS-CoV-2 target nucleic acids are NOT DETECTED. The SARS-CoV-2 RNA is generally detectable in upper and lower respiratory specimens during the acute phase of infection. Negative results do not preclude SARS-CoV-2 infection, do not rule out co-infections with other pathogens, and should not be used as the sole basis for treatment or other patient management decisions. Negative results must be combined with clinical observations, patient history, and epidemiological information. The expected result is Negative. Fact Sheet for Patients: SugarRoll.be Fact Sheet for Healthcare Providers: https://www.woods-mathews.com/ This test is not yet approved or cleared by the [place] FDA and  has been authorized for detection and/or diagnosis of SARS-CoV-2 by FDA under an Emergency Use Authorization (EUA). This EUA will remain  in effect (meaning this test can be used) for the  duration of the COVID-19 declaration under Section 56 4(b)(1) of the Act, 21 U.S.C. section 360bbb-3(b)(1), unless the authorization is terminated or revoked sooner. Performed at [facility], [place] [address], [place], [place] [address]   Culture, blood (routine x 2)     Status: None (Preliminary result)   Collection Time: 01/23/19  6:52 PM   Specimen: BLOOD RIGHT ARM  Result Value Ref Range Status   Specimen Description   Final    BLOOD RIGHT ARM Performed at [facility] [address], [place], [place] [address]    Special Requests   Final    BOTTLES DRAWN AEROBIC AND ANAEROBIC Blood Culture adequate volume Performed at [facility] [address], [place], [place] [address]    Culture   Final    NO GROWTH 3 DAYS Performed at [facility], [place] [address], [place], [place] [address]    Report Status PENDING  Incomplete  Culture, blood (routine x 2)     Status: None (Preliminary result)   Collection Time: 01/23/19  6:57 PM   Specimen: BLOOD  Result Value Ref Range Status   Specimen Description   Final    BLOOD LEFT ANTECUBITAL Performed at [facility] [address], [place], [place] [address]    Special Requests   Final    BOTTLES DRAWN AEROBIC AND ANAEROBIC Blood Culture adequate volume Performed at [facility] [address], [place], [place] [address]    Culture   Final    NO GROWTH 3 DAYS Performed at [facility], [place] [address] [place],  [address]    Report Status PENDING  Incomplete    Radiology Reports Dg Chest 1 View  Result Date: 01/23/2019 CLINICAL DATA:  Shortness of breath status post EGD EXAM: CHEST  1 VIEW COMPARISON:  12/30/2014 FINDINGS: Right lung is grossly clear. Patchy airspace opacities within the left mid and lower lung. No pleural effusion. Normal heart size. No pneumothorax. IMPRESSION: Moderate patchy airspace opacity in the left mid to  lower lung, possibly representing pneumonia. Electronically Signed   By: Donavan Foil M.D.   On: 01/23/2019 20:16   Dg Abd 1 View  Result Date: 01/23/2019 CLINICAL DATA:  Shortness of breath after EGD EXAM: ABDOMEN - 1 VIEW COMPARISON:  None. FINDINGS: Patchy airspace disease left base. Mild diffuse increased bowel gas without obstructive pattern. No radiopaque calculi. Scoliosis and degenerative changes of the spine IMPRESSION: Nonobstructed bowel-gas pattern Electronically Signed   By: Donavan Foil M.D.   On: 01/23/2019 20:17   Dg Chest Port 1 View  Result Date: 01/26/2019 CLINICAL DATA:  Shortness of breath and weakness. EXAM: PORTABLE CHEST 1 VIEW COMPARISON:  Chest radiograph 01/25/2019 FINDINGS: Overlying monitoring leads. The cardiomediastinal silhouette is unchanged. Redemonstrated airspace disease within the bilateral mid to lower lungs as well as bilateral pleural effusions. The right-sided airspace disease and pleural effusion appear increased from prior exam. No evidence of pneumothorax. No acute bony abnormality. IMPRESSION: Bilateral airspace disease within the mid to lower lungs as well as bilateral pleural effusions, increased on the right as compared to prior exam. Electronically Signed   By: Kellie Simmering DO   On: 01/26/2019 08:55   Dg Chest Port 1 View  Result Date: 01/25/2019 CLINICAL DATA:  Shortness of breath EXAM: PORTABLE CHEST 1 VIEW COMPARISON:  Chest radiograph 01/23/2019 FINDINGS: Monitoring leads overlie the patient. Stable cardiac and mediastinal contours. Slight interval improvement left mid lower lung consolidation. New right lower lung consolidation. Small left greater than right bilateral pleural effusions. IMPRESSION: 1. Slight interval improvement left mid lower lung consolidation. 2. New right lower lung consolidation concerning for pneumonia or aspiration. 3. Small left greater than right pleural effusions. Electronically Signed   By: Lovey Newcomer M.D.   On:  01/25/2019 09:39    Lab Data:  CBC: Recent Labs  Lab 01/23/19 0609  01/23/19 1946 01/23/19 2246 01/24/19 0153 01/24/19 0925 01/25/19 0429 01/26/19 0339  WBC 4.8  --  7.1  --   --  12.6* 8.5 8.3  HGB 8.1*   < > 8.4* 7.7* 7.3* 8.1* 9.8* 10.1*  HCT 25.2*   < > 26.9* 24.5* 23.8* 27.1* 31.8* 32.9*  MCV 96.6  --  97.8  --   --  102.3* 99.7 100.0  PLT 128*  --  135*  --   --  154 150 180   < > = values in this interval not displayed.   Basic Metabolic Panel: Recent Labs  Lab 01/23/19 0609 01/23/19 1949 01/24/19 0521 01/24/19 0925 01/25/19 0429 01/26/19 0339  NA 141 139  --  139 138 141  K 3.8 3.2* 4.0 4.5 3.7 4.1  CL 110 108  --  111 109 108  CO2 25 24  --  19* 23 25  GLUCOSE 118* 127*  --  118* 118* 127*  BUN 27* 18  --  14 11 13   CREATININE 0.81 0.71  --  0.79 0.80 0.87  CALCIUM 8.0* 8.0*  --  7.6* 8.0* 8.5*  MG  --  1.6* 2.4 2.2  --   --   PHOS  --  1.7* 3.9  --   --   --    GFR: Estimated Creatinine Clearance: 42.8 mL/min (by C-G formula based on SCr of 0.87 mg/dL). Liver Function Tests: Recent Labs  Lab 01/22/19 1131 01/22/19 1840 01/23/19 0609  AST 19 15 12*  ALT 18 15 11   ALKPHOS 43 35* 33*  BILITOT 0.5 0.7 0.9  PROT 6.9 5.8* 5.2*  ALBUMIN 4.3 3.7 3.1*   Recent Labs  Lab 01/22/19 1131  LIPASE 26   No results for input(s): AMMONIA in the last 168 hours. Coagulation Profile: No results for input(s): INR, PROTIME in the last 168 hours. Cardiac Enzymes: No results for input(s): CKTOTAL, CKMB, CKMBINDEX, TROPONINI in the last 168 hours. BNP (last 3 results) No results for input(s): PROBNP in the last 8760 hours. HbA1C: No results for input(s): HGBA1C in the last 72 hours. CBG: No results for input(s): GLUCAP in the last 168 hours. Lipid Profile: No results for input(s): CHOL, HDL, LDLCALC, TRIG, CHOLHDL, LDLDIRECT in the last 72 hours. Thyroid Function Tests: No results for input(s): TSH, T4TOTAL, FREET4, T3FREE, THYROIDAB in the last 72  hours. Anemia Panel: No results for input(s): VITAMINB12, FOLATE, FERRITIN, TIBC, IRON, RETICCTPCT in the last 72 hours. Urine analysis:    Component Value Date/Time   COLORURINE YELLOW 01/23/2019 1815   APPEARANCEUR HAZY (A) 01/23/2019 1815   LABSPEC 1.017 01/23/2019 1815   PHURINE 6.0 01/23/2019 1815   GLUCOSEU NEGATIVE 01/23/2019 1815   HGBUR NEGATIVE 01/23/2019 1815   [place] NEGATIVE 01/23/2019 [place] NEGATIVE 01/23/2019 1815   PROTEINUR 30 (A) 01/23/2019 1815   NITRITE NEGATIVE 01/23/2019 1815   LEUKOCYTESUR SMALL (A) 01/23/2019 1815     Dewarren Ledbetter M.D. [facility] 01/26/2019, 2:17 PM  Pager: [phone] Between 7am to 7pm - call Pager - [phone]  After 7pm go to [email or web address] - password [email or web address]  Call night coverage person covering after 7pm

## 2019-01-26 NOTE — Care Management Important Message (Signed)
Important Message  Patient Details IM Letter given to Cookie McGibboney RN to present to the Patient Name: Brittany Oliver MRN: [id number] Date of Birth: Nov 19, 1940   Medicare Important Message Given:  Yes     Kerin Salen 01/26/2019, 1:00 PM

## 2019-01-26 NOTE — Progress Notes (Addendum)
Progress Note  Patient Name: Brittany Oliver Date of Encounter: 01/26/2019  Primary Cardiologist: Jenkins Rouge, MD   Subjective   Not aware of atrial fib yesterday, has not been consistently aware of going in/out of it. +cough, thinks all SOB coming from PNA  Inpatient Medications    Scheduled Meds: . sodium chloride   Intravenous Once  . flecainide  75 mg Oral Q12H  . fluticasone furoate-vilanterol  1 puff Inhalation Daily  . folic acid  1 mg Oral Daily  . furosemide  40 mg Intravenous Daily  . gabapentin  900 mg Oral TID  . levalbuterol  0.63 mg Nebulization Q6H   And  . ipratropium  0.5 mg Nebulization Q6H  . methylPREDNISolone (SOLU-MEDROL) injection  40 mg Intravenous Q12H  . metoprolol tartrate  12.5 mg Oral BID  . multivitamin with minerals  1 tablet Oral Daily  . pantoprazole  40 mg Oral BID AC  . thiamine  100 mg Oral Daily   Or  . thiamine  100 mg Intravenous Daily  . umeclidinium bromide  1 puff Inhalation Daily   Continuous Infusions: . ampicillin-sulbactam (UNASYN) IV 3 g (01/26/19 0457)   PRN Meds: acetaminophen **OR** acetaminophen, fentaNYL (SUBLIMAZE) injection, levalbuterol, ondansetron **OR** ondansetron (ZOFRAN) IV, polyvinyl alcohol   Vital Signs    Vitals:   01/26/19 0820 01/26/19 0822 01/26/19 0825 01/26/19 0904  BP:   (!) 165/72   Pulse:   80   Resp:      Temp:      TempSrc:      SpO2: (!) 86% 91% 91% 94%  Weight:      Height:        Intake/Output Summary (Last 24 hours) at 01/26/2019 0920 Last data filed at 01/26/2019 0600 Gross per 24 hour  Intake 720 ml  Output 2300 ml  Net -1580 ml   Last 3 Weights 01/23/2019 01/22/2019 01/17/2018  Weight (lbs) 127 lb 13.9 oz 127 lb 12.8 oz 129 lb  Weight (kg) 58 kg 57.97 kg 58.514 kg      Telemetry    Atrial fib x 2 hr yesterday, o/w SR - Personally Reviewed  ECG    None today - Personally Reviewed  Physical Exam   GEN: No acute distress.   Neck:  JVD 9 cm Cardiac: RRR, no  murmurs, rubs, or gallops.  Respiratory: rales w/ decreased BS bases bilaterally, R>L. GI: Soft, nontender, non-distended  MS: No edema; No deformity. Neuro:  Nonfocal  Psych: Normal affect   Labs    High Sensitivity Troponin:   Recent Labs  Lab 01/23/19 2005  TROPONINIHS 14      Chemistry Recent Labs  Lab 01/22/19 1131 01/22/19 1840 01/23/19 0609  01/24/19 0925 01/25/19 0429 01/26/19 0339  NA 132* 138 141   < > 139 138 141  K 3.2* 4.3 3.8   < > 4.5 3.7 4.1  CL 95* 107 110   < > 111 109 108  CO2 24 24 25    < > 19* 23 25  GLUCOSE 172* 112* 118*   < > 118* 118* 127*  BUN 54* 42* 27*   < > 14 11 13   CREATININE 1.10* 0.86 0.81   < > 0.79 0.80 0.87  CALCIUM 8.8* 8.5* 8.0*   < > 7.6* 8.0* 8.5*  PROT 6.9 5.8* 5.2*  --   --   --   --   ALBUMIN 4.3 3.7 3.1*  --   --   --   --  AST 19 15 12*  --   --   --   --   ALT 18 15 11   --   --   --   --   ALKPHOS 43 35* 33*  --   --   --   --   BILITOT 0.5 0.7 0.9  --   --   --   --   GFRNONAA 48* >60 >60   < > >60 >60 >60  GFRAA 56* >60 >60   < > >60 >60 >60  ANIONGAP 13 7 6    < > 9 6 8    < > = values in this interval not displayed.     Hematology Recent Labs  Lab 01/24/19 0925 01/25/19 0429 01/26/19 0339  WBC 12.6* 8.5 8.3  RBC 2.65* 3.19* 3.29*  HGB 8.1* 9.8* 10.1*  HCT 27.1* 31.8* 32.9*  MCV 102.3* 99.7 100.0  MCH 30.6 30.7 30.7  MCHC 29.9* 30.8 30.7  RDW 15.0 15.2 15.0  PLT 154 150 180    BNP Recent Labs  Lab 01/25/19 0822  BNP 2,451.0*     Radiology    Dg Chest Port 1 View  Result Date: 01/26/2019 CLINICAL DATA:  Shortness of breath and weakness. EXAM: PORTABLE CHEST 1 VIEW COMPARISON:  Chest radiograph 01/25/2019 FINDINGS: Overlying monitoring leads. The cardiomediastinal silhouette is unchanged. Redemonstrated airspace disease within the bilateral mid to lower lungs as well as bilateral pleural effusions. The right-sided airspace disease and pleural effusion appear increased from prior exam. No evidence  of pneumothorax. No acute bony abnormality. IMPRESSION: Bilateral airspace disease within the mid to lower lungs as well as bilateral pleural effusions, increased on the right as compared to prior exam. Electronically Signed   By: Kellie Simmering DO   On: 01/26/2019 08:55   Dg Chest Port 1 View  Result Date: 01/25/2019 CLINICAL DATA:  Shortness of breath EXAM: PORTABLE CHEST 1 VIEW COMPARISON:  Chest radiograph 01/23/2019 FINDINGS: Monitoring leads overlie the patient. Stable cardiac and mediastinal contours. Slight interval improvement left mid lower lung consolidation. New right lower lung consolidation. Small left greater than right bilateral pleural effusions. IMPRESSION: 1. Slight interval improvement left mid lower lung consolidation. 2. New right lower lung consolidation concerning for pneumonia or aspiration. 3. Small left greater than right pleural effusions. Electronically Signed   By: Lovey Newcomer M.D.   On: 01/25/2019 09:39    Cardiac Studies   ECHO: 01/24/2019  1. Left ventricular ejection fraction, by visual estimation, is 60 to 65%. The left ventricle has normal function. There is no left ventricular hypertrophy.  2. Left ventricular diastolic parameters are indeterminate.  3. Global right ventricle has normal systolic function.The right ventricular size is normal. No increase in right ventricular wall thickness.  4. Left atrial size was normal.  5. Right atrial size was normal.  6. The mitral valve is normal in structure. Trace mitral valve regurgitation. No evidence of mitral stenosis.  7. The tricuspid valve is normal in structure. Tricuspid valve regurgitation is trivial.  8. The aortic valve is normal in structure. Aortic valve regurgitation is not visualized. No evidence of aortic valve sclerosis or stenosis.  9. The pulmonic valve was grossly normal. Pulmonic valve regurgitation is trivial. 10. Normal pulmonary artery systolic pressure. 11. The inferior vena cava is normal in  size with greater than 50% respiratory variability, suggesting right atrial pressure of 3 mmHg. 12. Patient converted to NSR during exam.  Patient Profile     78 y.o. female  w/ hx CVA 1987, HTN, HLD was admitted 11/05 w/ N&V, diarrhea, GIB. 11/07, pt had Afib RVR>>cards seeing  Assessment & Plan    1. PAF, RVR - had episode yesterday, not aware of it - suspect this happens at home at times - Atrial sizes normal - Got Flecainide 300 mg x 1 on 11/08, now on Flecainide 75 mg bid and Lopressor 12.5 mg bid - will increase Lopressor to 25 mg bid - follow on telemetry while here - CHA2DS2-VASc = 6 (age x 2, female, CVA x 2, HTN) - However, pt not currently anticoagulated 2nd GIB  2. Volume overload - EF nl w/ indeterminate diastolic parameters on echo, RA pressure est 3 mmHg - however, pt w/ signs/sx volume overload and CXR worsening - got Lasix 20 mg IV on 11/08 and is now on Lasix 40 mg IV qd, will increase to bid  Otherwise, per IM Active Problems:   Atypical facial pain   COPD mixed type (HCC)   GI bleed   Hypertension   Hyperlipidemia   Stroke ([place])   Anemia due to acute blood loss   Neuralgia facialis vera    For questions or updates, please contact [facility] Please consult [email or web address] for contact info under        Signed, Rosaria Ferries, PA-C  01/26/2019, 9:20 AM  .  As above, patient seen and examined.  She states her dyspnea is improving.  She denies chest pain or palpitations.  On telemetry she has converted back to atrial fibrillation with elevated rate.  We will discontinue flecainide and instead treat with amiodarone 400 mg twice daily for 1 week then 200 mg daily thereafter.  We will not anticoagulate at this point given recent GI bleed/ulcer.  However ultimately she will require anticoagulation as her CHADSvasc is 6. Note LV function normal. Check TSH.  Continue Lasix for volume excess and follow renal function.  Potential discharge tomorrow if she remains  in sinus rhythm.  Kirk Ruths

## 2019-01-26 NOTE — Telephone Encounter (Addendum)
Placed order for monitor.  ----- Message from Glyn Ade sent at 01/26/2019 12:10 PM EST ----- Dr Johnsie Cancel,  I need an order so we can schedule this  monitor please. Thank you so much!!! ----- Message ----- From: Josue Hector, MD Sent: 01/25/2019   8:45 AM EST To: Sherran Needs, NP, [facility] [place] Scheduling  Needs outpatient 14 day event monitor and f/u with afib clinic TOC

## 2019-01-26 NOTE — Addendum Note (Signed)
Addended by: Aris Georgia, Medhansh Brinkmeier L on: 01/26/2019 01:40 PM   Modules accepted: Orders

## 2019-01-26 NOTE — Consult Note (Addendum)
NAME:  Brittany Oliver, MRN:  [id number], DOB:  [birth year], LOS: 3 ADMISSION DATE:  01/22/2019, CONSULTATION DATE:  11/9 REFERRING MD:  Tana Coast, CHIEF COMPLAINT:  Hypoxia and wheezing    Brief History   78 year old female with history as mentioned below, admitted on 11/5 with upper GI bleed.  Course complicated by new onset paroxysmal atrial fibrillation first noted on 11/6, with new fever, and concern about possible aspiration pneumonia.  Over the next 72 hours had intermittent paroxysmal atrial fibrillation, further complicated by worsening dyspnea, wheezing and hypoxia.  Pulmonary asked to evaluate for wheezing and hypoxic respiratory failure.  History of present illness   78 year old female who was admitted on 11/5 w/ abd pain, vomiting coffe ground emesis and having Melena.  Initial hgb 9.0 FOB +. Post-admission hgb dropped as low as 7.2 and she therefore received 1 unit PRBCs 11/6.  Underwent EGD on 11/6 found few non-bleeding catered gastric ulcers in pre-pyloric region of stomach felt 2/2 NSAIDs or H pylori.  Later the evening on 11/6, spiked fever as high as 102.3, saturations as low as 88% on room air and went into af w/ RVR. HR 150s. Treated w/ rate control. PCXR was obtained showed bilateral airspace disease. She was placed on oxygen and empiric Unasyn started w/ concern for aspiration.  11/7 cardiology consulted for AF w/ RVR. At that point had converted to bradycardia. Had some hypotension after CCB. Got fluid. ECHO w/ LVEF 60-65%nml RA and RV. Estimated R atrial pressure 78mmHg.  11/8 back into AF w/ RVR. Started on Flecainide. No AC d/t bleeding.  11/9 changed to amiodarone.  Wheezing worse. Oxygen requirements as high as 7 liters. Current I&O balance 1.9 liters positive. Getting lasix. PCXR w/ bilateral R>L airspace disease. Worse when comparing imaging from day prior. Now w/ element of effusion. PCCM asked to eval.   Past Medical History  Former smoker,  COPD w/ FEV1 54% predicted  (had 12% improvement s/p SABA); maintained previously on Spiriva and Symbicort.  HTN, HL, prior CVA, ETOH abuse  Significant Hospital Events    11/5  Admitted w/ UGIB.  hgb dropped as low as 7.2 and she therefore received 1 unit PRBCs 11/6.  11/6 EGD : found few non-bleeding catered gastric ulcers in pre-pyloric region of stomach felt 2/2 NSAIDs or H pylori.  11/6, spiked fever as high as 102.3, saturations as low as 88% on room air and went into af w/ RVR. HR 150s. Treated w/ rate control. PCXR was obtained showed bilateral airspace disease. She was placed on oxygen and empiric Unasyn started w/ concern for aspiration.  11/7 cardiology consulted. Had some hypotension after CCB. Got fluid.  ECHO w/ LVEF 60-65%nml RA and RV. Estimated R atrial pressure 49mmHg.  11/8 back into AF w/ RVR. Started on Flecainide. No AC d/t bleeding.  11/9 changed to amiodarone.  Wheezing worse. Oxygen requirements as high as 7 liters. Current I&O balance 1.9 liters positive. Getting lasix. PCXR w/ bilateral R>L airspace disease. Worse when comparing imaging from day prior. Now w/ element of effusion. PCCM asked to eval.    Consults:  GI Cardiology Pulm  Procedures:    Significant Diagnostic Tests:  Echo 11/7: LVEF 60-65%nml RA and RV. Estimated R atrial pressure 53mmHg.   Micro Data:    Antimicrobials:  Unasyn 11/6 Interim history/subjective:  Feels much better.  Had been as high as 7 L of oxygen now on 4  Objective   Blood pressure (Abnormal) 126/95, pulse 86,  temperature 98 F (36.7 C), temperature source Oral, resp. rate 19, height 5\' 2"  (1.575 m), weight 58 kg, SpO2 92 %.        Intake/Output Summary (Last 24 hours) at 01/26/2019 1428 Last data filed at 01/26/2019 1423 Gross per 24 hour  Intake 720 ml  Output 3625 ml  Net -2905 ml   Filed Weights   01/22/19 1945 01/23/19 1247  Weight: 58 kg 58 kg    Examination: General: This is a pleasant 78 year old white female she is sitting up in  bed and currently in no acute distress on supplemental oxygen via nasal cannula HENT: Normocephalic atraumatic no jugular venous distention appreciated sclera nonicteric mucous membranes moist Lungs: Clear to auscultation diminished bases no wheezing Cardiovascular: Regular irregular remains in atrial fibrillation Abdomen: Soft not tender no organomegaly Extremities: Warm dry no edema brisk capillary refill strong pulses Neuro: Awake oriented no focal deficits GU: Voids quantity sufficient  Resolved Hospital Problem list     Assessment & Plan:   Acute hypoxic respiratory failure Bilateral pulmonary infiltrates Probable aspiration pneumonitis Probable pleural effusion COPD Atrial fibrillation with rapid rate ventricular response, CHA2DS2-VASc score 6 Upper GI bleed Hypertension    Pulmonary discussion: Acute hypoxic respiratory failure in setting of bilateral pulm infiltrates. Favor initially aspiration pneumonitis now likely complicated by element of pulmonary edema and possibly pleural effusions.  -suspect the AF contributed greatly to this, but the AF was also a result of a aspiration event and recent UGIB.  -WBC trending down -fever curve improved.  -Oxygen requirements improved with diuresis Plan/rec Cont supplemental oxygen Push lasix as long as BP/BUN and cr allow  Day 4 of 5 unasyn  Will repeat chest x-ray in a.m. Control heart rate Will need walking oximetry prior to discharge; may need oxygen at time of discharge but doubt she will require this long-term  COPD No evidence of bronchospasm currently Plan Continue current bronchodilators, can resume home regimen at time of discharge No indication for systemic steroids at this point  -All other issues per internal medicine and cardiology   We will see her again in the morning on 11/10, will also need to reestablish her in the pulmonary clinic for follow-up chest x-ray, walking pulse oximetry    Labs   CBC:  Recent Labs  Lab 01/23/19 0609  01/23/19 1946 01/23/19 2246 01/24/19 0153 01/24/19 0925 01/25/19 0429 01/26/19 0339  WBC 4.8  --  7.1  --   --  12.6* 8.5 8.3  HGB 8.1*   < > 8.4* 7.7* 7.3* 8.1* 9.8* 10.1*  HCT 25.2*   < > 26.9* 24.5* 23.8* 27.1* 31.8* 32.9*  MCV 96.6  --  97.8  --   --  102.3* 99.7 100.0  PLT 128*  --  135*  --   --  154 150 180   < > = values in this interval not displayed.    Basic Metabolic Panel: Recent Labs  Lab 01/23/19 0609 01/23/19 1949 01/24/19 0521 01/24/19 0925 01/25/19 0429 01/26/19 0339  NA 141 139  --  139 138 141  K 3.8 3.2* 4.0 4.5 3.7 4.1  CL 110 108  --  111 109 108  CO2 25 24  --  19* 23 25  GLUCOSE 118* 127*  --  118* 118* 127*  BUN 27* 18  --  14 11 13   CREATININE 0.81 0.71  --  0.79 0.80 0.87  CALCIUM 8.0* 8.0*  --  7.6* 8.0* 8.5*  MG  --  1.6* 2.4 2.2  --   --   PHOS  --  1.7* 3.9  --   --   --    GFR: Estimated Creatinine Clearance: 42.8 mL/min (by C-G formula based on SCr of 0.87 mg/dL). Recent Labs  Lab 01/23/19 1946 01/23/19 2115 01/23/19 2246 01/24/19 0153 01/24/19 0925 01/25/19 0429 01/26/19 0339  PROCALCITON  --  0.35  --  0.87  --  0.25  --   WBC 7.1  --   --   --  12.6* 8.5 8.3  LATICACIDVEN 1.0 1.1 1.1  --   --   --   --     Liver Function Tests: Recent Labs  Lab 01/22/19 1131 01/22/19 1840 01/23/19 0609  AST 19 15 12*  ALT 18 15 11   ALKPHOS 43 35* 33*  BILITOT 0.5 0.7 0.9  PROT 6.9 5.8* 5.2*  ALBUMIN 4.3 3.7 3.1*   Recent Labs  Lab 01/22/19 1131  LIPASE 26   No results for input(s): AMMONIA in the last 168 hours.  ABG No results found for: PHART, PCO2ART, PO2ART, HCO3, TCO2, ACIDBASEDEF, O2SAT   Coagulation Profile: No results for input(s): INR, PROTIME in the last 168 hours.  Cardiac Enzymes: No results for input(s): CKTOTAL, CKMB, CKMBINDEX, TROPONINI in the last 168 hours.  HbA1C: No results found for: HGBA1C  CBG: No results for input(s): GLUCAP in the last 168 hours.   Review of Systems:   Review of Systems  Constitutional: Negative for chills, diaphoresis, fever and malaise/fatigue.       She did initially have fever and chills on 11/6 has not had these since  HENT: Negative.   Eyes: Negative.   Respiratory: Positive for cough, sputum production, shortness of breath and wheezing.        These of all improved today  Cardiovascular: Positive for leg swelling. Negative for chest pain.  Gastrointestinal: Positive for nausea and vomiting.       Has not had this since time of admission  Genitourinary: Negative.   Musculoskeletal: Negative.   Skin: Negative.   Neurological: Negative.   Endo/Heme/Allergies: Negative.   Psychiatric/Behavioral: Negative.      Past Medical History  She,  has a past medical history of Arthritis, Cancer ([place]) (02/2013), Heart murmur, Hyperlipidemia, Hypertension, Neuralgia facialis vera, and Stroke ([place]) (1987).   Surgical History    Past Surgical History:  Procedure Laterality Date  . ABDOMINAL HYSTERECTOMY  1974   pt still has her uterus, took only 1 ovary and 1 tube  . APPENDECTOMY    . COLONOSCOPY    . DILATION AND CURETTAGE OF UTERUS  1965  . ESOPHAGOGASTRODUODENOSCOPY (EGD) WITH PROPOFOL Left 01/23/2019   Procedure: ESOPHAGOGASTRODUODENOSCOPY (EGD) WITH PROPOFOL;  Surgeon: Arta Silence, MD;  Location: [facility];  Service: Endoscopy;  Laterality: Left;  . EXCISION MELANOMA WITH SENTINEL LYMPH NODE BIOPSY Left 03/27/2013   Procedure: EXCISION MELANOMA LEFT POSTERIOR ARM WITH LEFT AXILLARY SENTINEL LYMPH NODE BIOPSY;  Surgeon: Zenovia Jarred, MD;  Location: [facility];  Service: General;  Laterality: Left;  . EYE SURGERY Bilateral 2006   cataract and laser surgery  . FINGER SURGERY Right    middle finger  . MASS EXCISION Left 09/15/2013   Procedure: EXCISION MASS LEFT UPPER ARM;  Surgeon: Zenovia Jarred, MD;  Location: [facility];  Service: General;  Laterality: Left;  . TONSILLECTOMY       Social History    reports that she quit smoking about 13 years ago. Her smoking  use included cigarettes. She has a 21.60 pack-year smoking history. She has never used smokeless tobacco. She reports current alcohol use of about 5.0 standard drinks of alcohol per week. She reports that she does not use drugs.   Family History   Her family history is negative for Ataxia, Chorea, Dementia, Mental retardation, Migraines, Multiple sclerosis, Neurofibromatosis, Neuropathy, Parkinsonism, Seizures, and Stroke. She was adopted.   Allergies No Known Allergies   Home Medications  Prior to Admission medications   Medication Sig Start Date End Date Taking? Authorizing Provider  aspirin 81 MG chewable tablet Chew 81 mg by mouth daily.   Yes [provider]  Calcium Carbonate-Vitamin D (CALCIUM 600/VITAMIN D) 600-400 MG-UNIT chew tablet Chew 1 tablet by mouth daily.   Yes [provider]  FLECTOR 1.3 % PTCH Place 1 patch onto the skin daily.  11/23/14  Yes [provider]  Fluticasone-Umeclidin-Vilant (TRELEGY ELLIPTA) 100-62.5-25 MCG/INH AEPB Inhale into the lungs.   Yes [provider]  gabapentin (NEURONTIN) 300 MG capsule Take 1 capsule (300 mg total) by mouth 3 (three) times daily. 04/17/18  Yes Tomi Likens, Adam R, DO  gabapentin (NEURONTIN) 600 MG tablet Take 1 tablet with 1 300mg  tablet three times daily 01/22/19  Yes Jaffe, Adam R, DO  ibuprofen (ADVIL) 200 MG tablet Take 800 mg by mouth every 6 (six) hours as needed for fever, headache or moderate pain.   Yes [provider]  ondansetron (ZOFRAN) 8 MG tablet Take 8 mg by mouth once.   Yes [provider]  polyvinyl alcohol (LIQUIFILM TEARS) 1.4 % ophthalmic solution Place 1 drop into both eyes as needed for dry eyes.   Yes [provider]  pravastatin (PRAVACHOL) 80 MG tablet Take 80 mg by mouth daily.   Yes [provider]  valsartan-hydrochlorothiazide (DIOVAN-HCT) 320-12.5 MG tablet Take 1 tablet by mouth  daily. 11/22/18  Yes [provider]  vitamin C (ASCORBIC ACID) 500 MG tablet Take 500 mg by mouth daily.   Yes [provider]     Critical care time: NA    Erick Colace ACNP-BC [place] Pager # [phone] OR # [phone] if no answer  I agree with the Advanced Practitioner's note, impression, and recommendations as outlined. I have taken an independent interval history, reviewed the chart and examined the patient.  My medical decision making is as follows:   Subjective: Ms. Picciotto has stable COPD, quit smoking 10 years ago, and is being managed by her outpatient family physician with Trelegy.  She has had no recent exacerbations.  Hospital stay complicated by GI bleed, blood transfusions, and atrial fibrillation with RVR.  PCCM now being consulted for hypoxemic respiratory failure in the setting of pulmonary edema.  Denies any chest pain or palpitations this afternoon.  Objective: Vitals:   01/26/19 1421 01/26/19 1530  BP: (!) 126/95   Pulse: 86   Resp:    Temp: 98 F (36.7 C)   SpO2: 92% 97%    Gen:       No acute distress, sitting up on nasal cannula. Neck:      No masses; no thyromegaly Lungs:    No increased respiratory effort, diminished bibasilar, no wheezes or crackles, not chest wall excursion CV:         Regularly irregular, heart rate in the 90s, no murmurs rubs or gallops Abd:      + bowel sounds; soft, non-tender; no palpable masses, no distension Ext:    No  edema; adequate peripheral perfusion Skin:       Warm and dry; no rash Neuro:    alert and oriented x 3 Psych:    normal mood and affect   Labs/Imaging: Chest x-ray shows cardiomegaly with bilateral pleural effusions  Assessment and Plan: Acute hypoxemic respiratory failure Atrial fibrillation with RVR Moderately severe COPD Aspiration pneumonia  Plan is to continue Lasix twice daily Would plan for a total 5 days of Unasyn for aspiration pneumonia Wean oxygen  for goal saturations over 88% She will need an ambulatory desaturation study prior to discharge I am happy to see her in clinic for follow-up for the hypoxemia and COPD. PCCM to follow.  [facility] 01/26/2019 4:23 PM  Pager: [phone] After hours pager: [phone]

## 2019-01-26 NOTE — Progress Notes (Signed)
PT Cancellation Note  Patient Details Name: Brittany Oliver MRN: [id number] DOB: 1940-06-29   Cancelled Treatment:    Reason Eval/Treat Not Completed: Medical issues which prohibited therapy spoke with RN regarding medical status given A-fib in RVR and apparent conversion to NSR recently. RN reports patient with increased shortness of breath, chest discomfort and decreased SpO2 at rest, requests PT hold for now. Will check back in on next date of service. If patient has urgent rehab needs, please direct message this therapist in [place] (8am-4pm) or call acute rehab office at number below.     Windell Norfolk, DPT, CBIS  Supplemental Physical Therapist [facility]    Pager [phone] Acute Rehab Office [phone]

## 2019-01-27 ENCOUNTER — Inpatient Hospital Stay (HOSPITAL_COMMUNITY): Payer: Medicare Other

## 2019-01-27 DIAGNOSIS — J410 Simple chronic bronchitis: Secondary | ICD-10-CM

## 2019-01-27 LAB — BASIC METABOLIC PANEL
Anion gap: 11 (ref 5–15)
BUN: 15 mg/dL (ref 8–23)
CO2: 30 mmol/L (ref 22–32)
Calcium: 8.6 mg/dL — ABNORMAL LOW (ref 8.9–10.3)
Chloride: 97 mmol/L — ABNORMAL LOW (ref 98–111)
Creatinine, Ser: 0.75 mg/dL (ref 0.44–1.00)
GFR calc Af Amer: >60 mL/min (ref >60)
GFR calc non Af Amer: >60 mL/min (ref >60)
Glucose, Bld: 150 mg/dL — ABNORMAL HIGH (ref 70–99)
Potassium: 3.5 mmol/L (ref 3.5–5.1)
Sodium: 138 mmol/L (ref 135–145)

## 2019-01-27 LAB — T4, FREE: Free T4: 1.16 ng/dL — ABNORMAL HIGH (ref 0.61–1.12)

## 2019-01-27 LAB — CBC
HCT: 31.5 % — ABNORMAL LOW (ref 36.0–46.0)
Hemoglobin: 10.2 g/dL — ABNORMAL LOW (ref 12.0–15.0)
MCH: 30.4 pg (ref 26.0–34.0)
MCHC: 32.4 g/dL (ref 30.0–36.0)
MCV: 94 fL (ref 80.0–100.0)
Platelets: 208 10*3/uL (ref 150–400)
RBC: 3.35 MIL/uL — ABNORMAL LOW (ref 3.87–5.11)
RDW: 13.9 % (ref 11.5–15.5)
WBC: 5.3 10*3/uL (ref 4.0–10.5)
nRBC: 0 % (ref 0.0–0.2)

## 2019-01-27 LAB — H. PYLORI ANTIBODY, IGG: H Pylori IgG: 0.2 Index Value (ref 0.00–0.79)

## 2019-01-27 LAB — TSH: TSH: 0.325 u[IU]/mL — ABNORMAL LOW (ref 0.350–4.500)

## 2019-01-27 MED ORDER — LEVALBUTEROL HCL 0.63 MG/3ML IN NEBU
0.6300 mg | INHALATION_SOLUTION | Freq: Two times a day (BID) | RESPIRATORY_TRACT | Status: DC
  Administered 2019-01-27 – 2019-01-28 (×2): 0.63 mg via RESPIRATORY_TRACT
  Filled 2019-01-27 (×2): qty 3

## 2019-01-27 MED ORDER — POTASSIUM CHLORIDE CRYS ER 20 MEQ PO TBCR
40.0000 meq | EXTENDED_RELEASE_TABLET | Freq: Once | ORAL | Status: AC
  Administered 2019-01-27: 40 meq via ORAL
  Filled 2019-01-27: qty 2

## 2019-01-27 MED ORDER — FUROSEMIDE 40 MG PO TABS
40.0000 mg | ORAL_TABLET | Freq: Every day | ORAL | Status: DC
  Administered 2019-01-28: 40 mg via ORAL
  Filled 2019-01-27: qty 1

## 2019-01-27 NOTE — Progress Notes (Addendum)
Progress Note  Patient Name: Brittany Oliver Date of Encounter: 01/27/2019  Primary Cardiologist: Jenkins Rouge, MD   Subjective   Breathing better, no chest pain. She is acutely aware of when she converted from atrial fibrillation to sinus rhythm this a.m.  However, she did not feel the atrial fib that she was in all day yesterday. She never had lower extremity edema, but did feel that her abdomen was bloated and tight prior to getting the Lasix  Inpatient Medications    Scheduled Meds: . sodium chloride   Intravenous Once  . amiodarone  400 mg Oral BID  . fluticasone furoate-vilanterol  1 puff Inhalation Daily  . folic acid  1 mg Oral Daily  . furosemide  40 mg Intravenous BID  . gabapentin  900 mg Oral TID  . levalbuterol  0.63 mg Nebulization TID  . methylPREDNISolone (SOLU-MEDROL) injection  40 mg Intravenous Q12H  . metoprolol tartrate  25 mg Oral BID  . multivitamin with minerals  1 tablet Oral Daily  . pantoprazole  40 mg Oral BID AC  . thiamine  100 mg Oral Daily   Or  . thiamine  100 mg Intravenous Daily  . umeclidinium bromide  1 puff Inhalation Daily   Continuous Infusions: . ampicillin-sulbactam (UNASYN) IV 3 g (01/27/19 0554)   PRN Meds: acetaminophen **OR** acetaminophen, fentaNYL (SUBLIMAZE) injection, levalbuterol, ondansetron **OR** ondansetron (ZOFRAN) IV, polyvinyl alcohol   Vital Signs    Vitals:   01/26/19 2030 01/26/19 2109 01/26/19 2124 01/27/19 0510  BP:  131/75  132/73  Pulse:  67 (!) 125 98  Resp:  18  18  Temp:  98.7 F (37.1 C)    TempSrc:  Oral  Oral  SpO2: 92% 95%  96%  Weight:      Height:        Intake/Output Summary (Last 24 hours) at 01/27/2019 [id number] Last data filed at 01/27/2019 0209 Gross per 24 hour  Intake 500 ml  Output 3925 ml  Net -3425 ml   Last 3 Weights 01/23/2019 01/22/2019 01/17/2018  Weight (lbs) 127 lb 13.9 oz 127 lb 12.8 oz 129 lb  Weight (kg) 58 kg 57.97 kg 58.514 kg      Telemetry    Atrial fib  from approximately 10 AM yesterday until approximately 6 AM today, currently sinus rhythm - Personally Reviewed  ECG    None today - Personally Reviewed  Physical Exam   General: Well developed, well nourished, female in no acute distress Head: Eyes PERRLA, Head normocephalic and atraumatic Lungs: Decreased breath sounds bases with a few rales bilaterally to auscultation. Heart: HRRR S1 S2, without rub or gallop. No murmur. 4/4 extremity pulses are 2+ & equal.  JVD at approximately 8 cm Abdomen: Bowel sounds are present, abdomen soft and non-tender without masses or  hernias noted. Msk: Normal strength and tone for age. Extremities: No clubbing, cyanosis or edema.    Skin:  No rashes or lesions noted. Neuro: Alert and oriented X 3. Psych:  Good affect, responds appropriately   Labs    High Sensitivity Troponin:   Recent Labs  Lab 01/23/19 2005  TROPONINIHS 14      Chemistry Recent Labs  Lab 01/22/19 1131 01/22/19 1840 01/23/19 0609  01/25/19 0429 01/26/19 0339 01/27/19 0343  NA 132* 138 141   < > 138 141 138  K 3.2* 4.3 3.8   < > 3.7 4.1 3.5  CL 95* 107 110   < > 109 108 97*  CO2 24 24 25    < > 23 25 30   GLUCOSE 172* 112* 118*   < > 118* 127* 150*  BUN 54* 42* 27*   < > 11 13 15   CREATININE 1.10* 0.86 0.81   < > 0.80 0.87 0.75  CALCIUM 8.8* 8.5* 8.0*   < > 8.0* 8.5* 8.6*  PROT 6.9 5.8* 5.2*  --   --   --   --   ALBUMIN 4.3 3.7 3.1*  --   --   --   --   AST 19 15 12*  --   --   --   --   ALT 18 15 11   --   --   --   --   ALKPHOS 43 35* 33*  --   --   --   --   BILITOT 0.5 0.7 0.9  --   --   --   --   GFRNONAA 48* >60 >60   < > >60 >60 >60  GFRAA 56* >60 >60   < > >60 >60 >60  ANIONGAP 13 7 6    < > 6 8 11    < > = values in this interval not displayed.     Hematology Recent Labs  Lab 01/25/19 0429 01/26/19 0339 01/27/19 0343  WBC 8.5 8.3 5.3  RBC 3.19* 3.29* 3.35*  HGB 9.8* 10.1* 10.2*  HCT 31.8* 32.9* 31.5*  MCV 99.7 100.0 94.0  MCH 30.7 30.7 30.4   MCHC 30.8 30.7 32.4  RDW 15.2 15.0 13.9  PLT 150 180 208    BNP Recent Labs  Lab 01/25/19 0822  BNP 2,451.0*    Lab Results  Component Value Date   TSH 0.325 (L) 01/27/2019     Radiology    Dg Chest Port 1 View  Result Date: 01/26/2019 CLINICAL DATA:  Shortness of breath and weakness. EXAM: PORTABLE CHEST 1 VIEW COMPARISON:  Chest radiograph 01/25/2019 FINDINGS: Overlying monitoring leads. The cardiomediastinal silhouette is unchanged. Redemonstrated airspace disease within the bilateral mid to lower lungs as well as bilateral pleural effusions. The right-sided airspace disease and pleural effusion appear increased from prior exam. No evidence of pneumothorax. No acute bony abnormality. IMPRESSION: Bilateral airspace disease within the mid to lower lungs as well as bilateral pleural effusions, increased on the right as compared to prior exam. Electronically Signed   By: Kellie Simmering DO   On: 01/26/2019 08:55   Dg Chest Port 1 View  Result Date: 01/25/2019 CLINICAL DATA:  Shortness of breath EXAM: PORTABLE CHEST 1 VIEW COMPARISON:  Chest radiograph 01/23/2019 FINDINGS: Monitoring leads overlie the patient. Stable cardiac and mediastinal contours. Slight interval improvement left mid lower lung consolidation. New right lower lung consolidation. Small left greater than right bilateral pleural effusions. IMPRESSION: 1. Slight interval improvement left mid lower lung consolidation. 2. New right lower lung consolidation concerning for pneumonia or aspiration. 3. Small left greater than right pleural effusions. Electronically Signed   By: Lovey Newcomer M.D.   On: 01/25/2019 09:39    Cardiac Studies   ECHO: 01/24/2019  1. Left ventricular ejection fraction, by visual estimation, is 60 to 65%. The left ventricle has normal function. There is no left ventricular hypertrophy.  2. Left ventricular diastolic parameters are indeterminate.  3. Global right ventricle has normal systolic function.The  right ventricular size is normal. No increase in right ventricular wall thickness.  4. Left atrial size was normal.  5. Right atrial size was normal.  6. The mitral  valve is normal in structure. Trace mitral valve regurgitation. No evidence of mitral stenosis.  7. The tricuspid valve is normal in structure. Tricuspid valve regurgitation is trivial.  8. The aortic valve is normal in structure. Aortic valve regurgitation is not visualized. No evidence of aortic valve sclerosis or stenosis.  9. The pulmonic valve was grossly normal. Pulmonic valve regurgitation is trivial. 10. Normal pulmonary artery systolic pressure. 11. The inferior vena cava is normal in size with greater than 50% respiratory variability, suggesting right atrial pressure of 3 mmHg. 12. Patient converted to NSR during exam.  Patient Profile     78 y.o. female w/ hx CVA 1987, HTN, HLD was admitted 11/05 w/ N&V, diarrhea, GIB. 11/07, pt had Afib RVR>>cards seeing  Assessment & Plan    1. PAF, RVR -She was in atrial fib most of the day yesterday, but not aware of it -On 11/9, flecainide was discontinued and she was started on amiodarone 400 mg twice daily x1 week then 200 mg daily -Tolerating increased dose of metoprolol well, no bradycardia or long pauses on telemetry -No anticoagulation secondary to GI bleed at this time -CHA2DS2-VASc equals 6  2. Volume overload - renal function stable w/ diuresis - was > 4L up, now 800 ml up - need daily wts -Renal function is tolerating diuresis, continue for now -Supplement potassium -Recheck BMET in a.m. if not already ordered -Hopefully can change to p.o. Lasix this p.m. or in a.m. -She was not on home oxygen.  She is to ambulate with staff and see how tolerated  Otherwise, per IM Active Problems:   Atypical facial pain   COPD mixed type ([place])   GI bleed   Hypertension   Hyperlipidemia   Stroke ([place])   Anemia due to acute blood loss   Neuralgia facialis vera   For  questions or updates, please contact [facility] Please consult [email or web address] for contact info under      Signed, Rosaria Ferries, PA-C  01/27/2019, 7:52 AM  .  As above, patient seen and examined.  She has converted to sinus rhythm.  She denies dyspnea this morning.  We will continue amiodarone load as previously described.  Volume status is improved.  Change Lasix to 40 mg daily.  She could likely be discharged tomorrow morning if stable.  Note she will need anticoagulation long-term once okay from a GI standpoint.  This can be readdressed when she sees Dr. Johnsie Cancel back as an outpatient.  TSH mildly decreased.  Check free T4.  Kirk Ruths, MD

## 2019-01-27 NOTE — Evaluation (Signed)
Physical Therapy Evaluation Patient Details Name: Brittany Oliver MRN: [id number] DOB: 1940-12-25 Today's Date: 01/27/2019   History of Present Illness  Pt is 78 yo female admitted with upper GI bleed and anemia.  Hospital stay complicated by development of afib on 11/6 and acute diastolic CHF.  HR now controlled and pt has been diuresed.  Clinical Impression   Pt has been evaluated by PT and presents with near baseline mobility.  She was able to transfer and ambulate safely without LOB.  Pt has some gait abnormalities at baseline, but was still safe with no LOB.  Pt has good support at home.  Her vital signs were stable during activity and she was able to talk and walk without SHOB.  No further acute PT indicated.      Follow Up Recommendations No PT follow up    Equipment Recommendations  None recommended by PT    Recommendations for Other Services       Precautions / Restrictions Precautions Precautions: Fall      Mobility  Bed Mobility Overal bed mobility: Independent             General bed mobility comments: Pt on EOB at arrival  Transfers Overall transfer level: Needs assistance Equipment used: None Transfers: Sit to/from Stand Sit to Stand: Supervision            Ambulation/Gait Ambulation/Gait assistance: Supervision Gait Distance (Feet): 400 Feet Assistive device: None Gait Pattern/deviations: WFL(Within Functional Limits)     General Gait Details: Pt had reported unsteady gait at baseline; however, only slightly drifted L and R at times.  No overt LOB.  Able to talk, look up/down/L/R, stop, turn, and march all without LOB>  Stairs            Wheelchair Mobility    Modified Rankin (Stroke Patients Only)       Balance Overall balance assessment: Needs assistance Sitting-balance support: No upper extremity supported;Feet unsupported Sitting balance-Leahy Scale: Normal     Standing balance support: No upper extremity  supported Standing balance-Leahy Scale: Good                               Pertinent Vitals/Pain Pain Assessment: No/denies pain    Home Living Family/patient expects to be discharged to:: Private residence Living Arrangements: Spouse/significant other Available Help at Discharge: Family Type of Home: House Home Access: Stairs to enter Entrance Stairs-Rails: Psychiatric nurse of Steps: 3 Home Layout: Two level;Able to live on main level with bedroom/bathroom Home Equipment: Shower seat      Prior Function Level of Independence: Independent         Comments: Pt independent with all ADLs and IADLs.  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     Hand Dominance        Extremity/Trunk Assessment   Upper Extremity Assessment Upper Extremity Assessment: Overall WFL for tasks assessed    Lower Extremity Assessment Lower Extremity Assessment: Overall WFL for tasks assessed    Cervical / Trunk Assessment Cervical / Trunk Assessment: Normal  Communication   Communication: No difficulties  Cognition Arousal/Alertness: Awake/alert Behavior During Therapy: WFL for tasks assessed/performed Overall Cognitive Status: Within Functional Limits for tasks assessed  General Comments General comments (skin integrity, edema, etc.): Pt's HR 70-85 bpm throughout treatment.  O2 sats 92-97% throughout treatment with no episodes of wheezing.  O2 monitor did read 81% at one point but quickly (<10sec) increased to 95% so likely due to error in reading    Exercises     Assessment/Plan    PT Assessment Patent does not need any further PT services  PT Problem List         PT Treatment Interventions      PT Goals (Current goals can be found in the Care Plan section)  Acute Rehab PT Goals Patient Stated Goal: return home PT Goal Formulation: With patient/family Time For Goal  Achievement: 02/10/19 Potential to Achieve Goals: Good    Frequency     Barriers to discharge        Co-evaluation               AM-PAC PT "6 Clicks" Mobility  Outcome Measure Help needed turning from your back to your side while in a flat bed without using bedrails?: None Help needed moving from lying on your back to sitting on the side of a flat bed without using bedrails?: None Help needed moving to and from a bed to a chair (including a wheelchair)?: None Help needed standing up from a chair using your arms (e.g., wheelchair or bedside chair)?: None Help needed to walk in hospital room?: None Help needed climbing 3-5 steps with a railing? : None 6 Click Score: 24    End of Session Equipment Utilized During Treatment: Gait belt Activity Tolerance: Patient tolerated treatment well Patient left: in bed;with call bell/phone within reach;with family/visitor present(sitting on EOB with daughter present) Nurse Communication: Mobility status PT Visit Diagnosis: Other abnormalities of gait and mobility (R26.89)    Time: 1206-1226 PT Time Calculation (min) (ACUTE ONLY): 20 min   Charges:   PT Evaluation $PT Eval Low Complexity: 1 Low          Maggie Font, PT Acute Rehab Services [phone]   Karlton Lemon 01/27/2019, 1:53 PM

## 2019-01-27 NOTE — Progress Notes (Signed)
SATURATION QUALIFICATIONS: (This note is used to comply with regulatory documentation for home oxygen)  Patient Saturations on Room Air at Rest = 94%  Patient Saturations on Room Air while Ambulating = 89-91%

## 2019-01-27 NOTE — Progress Notes (Signed)
Notified by central tele, pt converted into Afib rhythm.  Pt did not feel herself go into afib

## 2019-01-27 NOTE — Progress Notes (Signed)
[facility]                                                                              Patient Demographics  Brittany Oliver, is a 78 y.o. female, DOB - 08/01/1940, [id number]  Admit date - 01/22/2019   Admitting Physician Elmarie Shiley, MD  Outpatient Primary MD for the patient is [name], MD  Outpatient specialists:   LOS - 4  days   Medical records reviewed and are as summarized below:    Chief Complaint  Patient presents with  . Emesis  . Diarrhea       Brief summary   78 y.o.femalepast medical history significant for stroke, hypertension, hyperlipidemia, history of skin melanoma who presents  With c/o nausea vomiting, coffee-ground emesis episodes and also associated with diarrhea/ melena in the setting daily alcohol, ASA and NSAID use. Last colonoscopy 2007 showed some hyperplastic polyps. No prior endoscopy.  Hemoglobin on admission 9.0.  Patient was admitted for further work-up  Assessment & Plan    Principal problem Acute blood loss anemia with upper GI bleed -Patient was placed on PPI IV drip.  Hemoglobin 9.0-> 7.2, received 1 unit of packed RBCs. -GI was consulted, underwent EGD which showed nonbleeding gastric ulcers with no stigmata of bleeding, highly likely source of patient's recent bleeding, NSAIDs versus H. pylori most likely etiologies.  Normal duodenum. -H&H currently stable, 10.2, had received 1 unit packed RBC on 11/7   Paroxysmal atrial fibrillation with RVR-new diagnosis -Patient had converted to A. fib with RVR on 11/7 overnight, was placed on beta-blocker.  BP had plummeted down with 1 dose of IV Cardizem.  Cardiology was consulted and recommended lower dose of Lopressor.  Not an anticoagulation candidate due to GI bleeding. -2D echo showed EF of 60 to 65% with no LVH -Patient in and out of atrial fibrillation with RVR, was started on flecainide on 11/8, was dyspneic and diffusely wheezing on 11/9, chest x-ray  with bilateral pleural effusions.  Flecainide was discontinued and started on amiodarone on 11/9. -Currently normal sinus rhythm, continue amiodarone, cardiology following. -Will likely need anticoagulation once cleared by gastroenterology, will be addressed by Dr. Johnsie Cancel outpatient.  Acute diastolic CHF likely due to volume overload, atrial fibrillation with RVR, IV fluids -Good response with IV diuresis, was pulled 4.3 L positive on 11/9, today 813 cc -Cardiology following, transitioned to oral Lasix from 11/11, may not need Lasix at the time of discharge.  Left lung pneumonia, COPD exacerbation -Post ED EGD, patient was found to have shortness of breath possibly could be from A. fib with RVR however chest x-ray showed moderate patchy airspace opacity in the left mid to lower lung. -Continue IV Unasyn, day #5, DC after today's dose.  Pulmonology was consulted at daughter's request. -Home O2 evaluation, continue bronchodilators. -Chest x-ray done today, overall improved aeration to the lungs  History of CVA Holding aspirin at this time, now has new paroxysmal A. fib, no AC due to GI bleed for now, cardiology will address outpatient  History of alcohol use Not in any withdrawals  Mild AKI Creatinine stable  Mild hyponatremia, hypokalemia Resolved  Facial pain Hold NSAIDs, continue Neurontin.  Code Status: Full CODE STATUS DVT Prophylaxis: SCDs Family Communication: Discussed all imaging results, lab results, explained to the patient's daughter  Disposition Plan: Doing well with diuresis, hopefully will DC home in a.m.  Time Spent in minutes 35 minutes  Procedures:  2D echo  Consultants:   Cardiology  Antimicrobials:   Anti-infectives (From admission, onward)   Start     Dose/Rate Route Frequency Ordered Stop   01/24/19 0600  Ampicillin-Sulbactam (UNASYN) 3 g in sodium chloride 0.9 % 100 mL IVPB     3 g 200 mL/hr over 30 Minutes Intravenous Every 6 hours 01/24/19  0328 01/27/19 1258   01/23/19 2130  Ampicillin-Sulbactam (UNASYN) 3 g in sodium chloride 0.9 % 100 mL IVPB     3 g 200 mL/hr over 30 Minutes Intravenous  Once 01/23/19 2123 01/24/19 0033         Medications  Scheduled Meds: . sodium chloride   Intravenous Once  . amiodarone  400 mg Oral BID  . fluticasone furoate-vilanterol  1 puff Inhalation Daily  . folic acid  1 mg Oral Daily  . [START ON 01/28/2019] furosemide  40 mg Oral Daily  . gabapentin  900 mg Oral TID  . levalbuterol  0.63 mg Nebulization BID  . methylPREDNISolone (SOLU-MEDROL) injection  40 mg Intravenous Q12H  . metoprolol tartrate  25 mg Oral BID  . multivitamin with minerals  1 tablet Oral Daily  . pantoprazole  40 mg Oral BID AC  . thiamine  100 mg Oral Daily   Or  . thiamine  100 mg Intravenous Daily  . umeclidinium bromide  1 puff Inhalation Daily   Continuous Infusions:  PRN Meds:.acetaminophen **OR** acetaminophen, fentaNYL (SUBLIMAZE) injection, levalbuterol, ondansetron **OR** ondansetron (ZOFRAN) IV, polyvinyl alcohol      Subjective:   Brittany Oliver was seen and examined today.  Feeling a lot better today, wheezing improved.  No chest pain or dizziness or lightheadedness.  Patient states that she can feel it when she comes out of the atrial fibrillation back to normal sinus rhythm but not when she goes in.  Objective:   Vitals:   01/26/19 2109 01/26/19 2124 01/27/19 0510 01/27/19 0751  BP: 131/75  132/73   Pulse: 67 (!) 125 98   Resp: 18  18   Temp: 98.7 F (37.1 C)     TempSrc: Oral  Oral   SpO2: 95%  96% 96%  Weight:      Height:        Intake/Output Summary (Last 24 hours) at 01/27/2019 1318 Last data filed at 01/27/2019 0209 Gross per 24 hour  Intake 500 ml  Output 1925 ml  Net -1425 ml     Wt Readings from Last 3 Encounters:  01/23/19 58 kg  01/17/18 58.5 kg  01/17/17 57.6 kg   Physical Exam  General: Alert and oriented x 3, NAD  Eyes:   HEENT:  Atraumatic,  normocephalic  Cardiovascular: S1 S2 clear, no murmurs, RRR. No pedal edema b/l  Respiratory: Decreased breath sound at bases, but fairly clear  gastrointestinal: Soft, nontender, nondistended, NBS  Ext: no pedal edema bilaterally  Neuro: no new deficits  Musculoskeletal: No cyanosis, clubbing  Skin: No rashes  Psych: Normal affect and demeanor, alert and oriented x3      Data Reviewed:  I have personally reviewed following labs and imaging studies  Micro Results Recent Results (from the past 240 hour(s))  SARS CORONAVIRUS 2 (TAT 6-24  HRS) Nasopharyngeal Nasopharyngeal Swab     Status: None   Collection Time: 01/22/19 12:35 PM   Specimen: Nasopharyngeal Swab  Result Value Ref Range Status   SARS Coronavirus 2 NEGATIVE NEGATIVE Final    Comment: (NOTE) SARS-CoV-2 target nucleic acids are NOT DETECTED. The SARS-CoV-2 RNA is generally detectable in upper and lower respiratory specimens during the acute phase of infection. Negative results do not preclude SARS-CoV-2 infection, do not rule out co-infections with other pathogens, and should not be used as the sole basis for treatment or other patient management decisions. Negative results must be combined with clinical observations, patient history, and epidemiological information. The expected result is Negative. Fact Sheet for Patients: SugarRoll.be Fact Sheet for Healthcare Providers: https://www.woods-mathews.com/ This test is not yet approved or cleared by the [place] FDA and  has been authorized for detection and/or diagnosis of SARS-CoV-2 by FDA under an Emergency Use Authorization (EUA). This EUA will remain  in effect (meaning this test can be used) for the duration of the COVID-19 declaration under Section 56 4(b)(1) of the Act, 21 U.S.C. section 360bbb-3(b)(1), unless the authorization is terminated or revoked sooner. Performed at [facility], [place]  [address] [place], [place] [address]   Culture, blood (routine x 2)     Status: None (Preliminary result)   Collection Time: 01/23/19  6:52 PM   Specimen: BLOOD RIGHT ARM  Result Value Ref Range Status   Specimen Description   Final    BLOOD RIGHT ARM Performed at [place] [address] [place], [place] [address]    Special Requests   Final    BOTTLES DRAWN AEROBIC AND ANAEROBIC Blood Culture adequate volume Performed at [place] [address], [place], [place] [address]    Culture   Final    NO GROWTH 4 DAYS Performed at [facility], [place] [address], [place], [place] [address]    Report Status PENDING  Incomplete  Culture, blood (routine x 2)     Status: None (Preliminary result)   Collection Time: 01/23/19  6:57 PM   Specimen: BLOOD  Result Value Ref Range Status   Specimen Description   Final    BLOOD LEFT ANTECUBITAL Performed at [place] [address], [place], [place] [address]    Special Requests   Final    BOTTLES DRAWN AEROBIC AND ANAEROBIC Blood Culture adequate volume Performed at [place] [address], [place], [place] [address]    Culture   Final    NO GROWTH 4 DAYS Performed at [facility], [place] [address], [place], [place] [address]    Report Status PENDING  Incomplete    Radiology Reports Dg Chest 1 View  Result Date: 01/23/2019 CLINICAL DATA:  Shortness of breath status post EGD EXAM: CHEST  1 VIEW COMPARISON:  12/30/2014 FINDINGS: Right lung is grossly clear. Patchy airspace opacities within the left mid and lower lung. No pleural effusion. Normal heart size. No pneumothorax. IMPRESSION: Moderate patchy airspace opacity in the left mid to lower lung, possibly representing pneumonia. Electronically Signed   By: Donavan Foil M.D.   On: 01/23/2019 20:16   Dg Chest 2 View  Result Date: 01/27/2019 CLINICAL DATA:  78 year old female with fever. Concern  for aspiration. EXAM: CHEST - 2 VIEW COMPARISON:  Chest radiograph dated 01/26/2019 FINDINGS: Small bilateral pleural effusions and bibasilar atelectasis versus infiltrate. Overall improved aeration of the lungs with decreased in  the size of the pleural effusions compared to the radiograph of 01/26/2019. There is no pneumothorax. The cardiac silhouette is within normal limits. Atherosclerotic calcification of the aortic arch. No acute osseous pathology. IMPRESSION: Small bilateral pleural effusions and bibasilar atelectasis versus infiltrate. Overall improved aeration of the lungs compared to the radiograph of 01/26/2019. Electronically Signed   By: Anner Crete M.D.   On: 01/27/2019 11:06   Dg Abd 1 View  Result Date: 01/23/2019 CLINICAL DATA:  Shortness of breath after EGD EXAM: ABDOMEN - 1 VIEW COMPARISON:  None. FINDINGS: Patchy airspace disease left base. Mild diffuse increased bowel gas without obstructive pattern. No radiopaque calculi. Scoliosis and degenerative changes of the spine IMPRESSION: Nonobstructed bowel-gas pattern Electronically Signed   By: Donavan Foil M.D.   On: 01/23/2019 20:17   Dg Chest Port 1 View  Result Date: 01/26/2019 CLINICAL DATA:  Shortness of breath and weakness. EXAM: PORTABLE CHEST 1 VIEW COMPARISON:  Chest radiograph 01/25/2019 FINDINGS: Overlying monitoring leads. The cardiomediastinal silhouette is unchanged. Redemonstrated airspace disease within the bilateral mid to lower lungs as well as bilateral pleural effusions. The right-sided airspace disease and pleural effusion appear increased from prior exam. No evidence of pneumothorax. No acute bony abnormality. IMPRESSION: Bilateral airspace disease within the mid to lower lungs as well as bilateral pleural effusions, increased on the right as compared to prior exam. Electronically Signed   By: Kellie Simmering DO   On: 01/26/2019 08:55   Dg Chest Port 1 View  Result Date: 01/25/2019 CLINICAL DATA:  Shortness of  breath EXAM: PORTABLE CHEST 1 VIEW COMPARISON:  Chest radiograph 01/23/2019 FINDINGS: Monitoring leads overlie the patient. Stable cardiac and mediastinal contours. Slight interval improvement left mid lower lung consolidation. New right lower lung consolidation. Small left greater than right bilateral pleural effusions. IMPRESSION: 1. Slight interval improvement left mid lower lung consolidation. 2. New right lower lung consolidation concerning for pneumonia or aspiration. 3. Small left greater than right pleural effusions. Electronically Signed   By: Lovey Newcomer M.D.   On: 01/25/2019 09:39    Lab Data:  CBC: Recent Labs  Lab 01/23/19 1946  01/24/19 0153 01/24/19 0925 01/25/19 0429 01/26/19 0339 01/27/19 0343  WBC 7.1  --   --  12.6* 8.5 8.3 5.3  HGB 8.4*   < > 7.3* 8.1* 9.8* 10.1* 10.2*  HCT 26.9*   < > 23.8* 27.1* 31.8* 32.9* 31.5*  MCV 97.8  --   --  102.3* 99.7 100.0 94.0  PLT 135*  --   --  154 150 180 208   < > = values in this interval not displayed.   Basic Metabolic Panel: Recent Labs  Lab 01/23/19 1949 01/24/19 0521 01/24/19 0925 01/25/19 0429 01/26/19 0339 01/27/19 0343  NA 139  --  139 138 141 138  K 3.2* 4.0 4.5 3.7 4.1 3.5  CL 108  --  111 109 108 97*  CO2 24  --  19* 23 25 30   GLUCOSE 127*  --  118* 118* 127* 150*  BUN 18  --  14 11 13 15   CREATININE 0.71  --  0.79 0.80 0.87 0.75  CALCIUM 8.0*  --  7.6* 8.0* 8.5* 8.6*  MG 1.6* 2.4 2.2  --   --   --   PHOS 1.7* 3.9  --   --   --   --    GFR: Estimated Creatinine Clearance: 46.6 mL/min (by C-G formula based on SCr of 0.75 mg/dL). Liver Function Tests:  Recent Labs  Lab 01/22/19 1131 01/22/19 1840 01/23/19 0609  AST 19 15 12*  ALT 18 15 11   ALKPHOS 43 35* 33*  BILITOT 0.5 0.7 0.9  PROT 6.9 5.8* 5.2*  ALBUMIN 4.3 3.7 3.1*   Recent Labs  Lab 01/22/19 1131  LIPASE 26   No results for input(s): AMMONIA in the last 168 hours. Coagulation Profile: No results for input(s): INR, PROTIME in the last  168 hours. Cardiac Enzymes: No results for input(s): CKTOTAL, CKMB, CKMBINDEX, TROPONINI in the last 168 hours. BNP (last 3 results) No results for input(s): PROBNP in the last 8760 hours. HbA1C: No results for input(s): HGBA1C in the last 72 hours. CBG: No results for input(s): GLUCAP in the last 168 hours. Lipid Profile: No results for input(s): CHOL, HDL, LDLCALC, TRIG, CHOLHDL, LDLDIRECT in the last 72 hours. Thyroid Function Tests: Recent Labs    01/27/19 0343  TSH 0.325*   Anemia Panel: No results for input(s): VITAMINB12, FOLATE, FERRITIN, TIBC, IRON, RETICCTPCT in the last 72 hours. Urine analysis:    Component Value Date/Time   COLORURINE YELLOW 01/23/2019 1815   APPEARANCEUR HAZY (A) 01/23/2019 1815   LABSPEC 1.017 01/23/2019 1815   PHURINE 6.0 01/23/2019 1815   GLUCOSEU NEGATIVE 01/23/2019 1815   HGBUR NEGATIVE 01/23/2019 1815   [place] NEGATIVE 01/23/2019 [place] NEGATIVE 01/23/2019 1815   PROTEINUR 30 (A) 01/23/2019 1815   NITRITE NEGATIVE 01/23/2019 1815   LEUKOCYTESUR SMALL (A) 01/23/2019 1815     Merwyn Hodapp M.D. [facility] 01/27/2019, 1:18 PM  Pager: [phone] Between 7am to 7pm - call Pager - [phone]  After 7pm go to [email or web address] - password [email or web address]  Call night coverage person covering after 7pm            [facility]                                                                              Patient Demographics  Brittany Oliver, is a 78 y.o. female, DOB - 1940/07/12, [id number]  Admit date - 01/22/2019   Admitting Physician Elmarie Shiley, MD  Outpatient Primary MD for the patient is [name], MD  Outpatient specialists:   LOS - 4  days   Medical records reviewed and are as summarized below:    Chief Complaint  Patient presents with  . Emesis  . Diarrhea       Brief summary   78 y.o.femalepast medical history significant for stroke, hypertension, hyperlipidemia, history of skin  melanoma who presents  With c/o nausea vomiting, coffee-ground emesis episodes and also associated with diarrhea/ melena in the setting daily alcohol, ASA and NSAID use. Last colonoscopy 2007 showed some hyperplastic polyps. No prior endoscopy.  Hemoglobin on admission 9.0.  Patient was admitted for further work-up  Assessment & Plan    Principal problem Acute blood loss anemia with upper GI bleed -Patient was placed on PPI IV drip.  Hemoglobin 9.0-> 7.2, received 1 unit of packed RBCs. -GI was consulted, underwent EGD which showed nonbleeding gastric ulcers with no stigmata of bleeding, highly likely source of patient's recent bleeding, NSAIDs versus H. pylori most likely etiologies.  Normal duodenum. -Hemoglobin 7.3 this morning, ordered  1 unit of packed RBC transfusion but repeat H&H this morning however showed hemoglobin of 8.1  Paroxysmal atrial fibrillation with RVR-new diagnosis - Overnight patient converted to atrial fibrillation with RVR, heart rate in 150s.  Was placed on beta-blocker -This a.m., heart rate still not controlled, in 120s, gave 1 dose of IV Cardizem 20 mg, patient converted from A. fib to normal sinus rhythm however had a 4.9-second pause, BP plummeted down to 77/54 and heart rate down to 50s.  Patient received IV fluid bolus to support. -Cardiology was consulted, recommended lower Lopressor dose, not anticoagulation candidate due to GI bleeding -2D echo showed EF of 60 to 65% with no LVH  Left lung pneumonia -Post ED EGD, patient was found to have shortness of breath possibly could be from A. fib with RVR however chest x-ray showed moderate patchy airspace opacity in the left mid to lower lung. -Leukocytosis, overnight with fevers, 101.5 F.  Overnight was tachycardiac, hypotensive, met sepsis criteria  -Continue IV Unasyn  History of CVA Holding aspirin at this time, now has new paroxysmal A. fib, no AC due to GI bleed  History of alcohol use Monitor closely  for any withdrawals, continue CIWA  Mild AKI -Resolved  Mild hyponatremia, hypokalemia -Currently stable  Facial pain Hold NSAIDs, continue Neurontin.  Code Status: Full CODE STATUS DVT Prophylaxis: SCDs Family Communication: Discussed all imaging results, lab results, explained to the patient   Disposition Plan: Hopefully next 24 to 48 hours  Time Spent in minutes 35 minutes  Procedures:  2D echo  Consultants:   Cardiology  Antimicrobials:   Anti-infectives (From admission, onward)   Start     Dose/Rate Route Frequency Ordered Stop   01/24/19 0600  Ampicillin-Sulbactam (UNASYN) 3 g in sodium chloride 0.9 % 100 mL IVPB     3 g 200 mL/hr over 30 Minutes Intravenous Every 6 hours 01/24/19 0328 01/27/19 1258   01/23/19 2130  Ampicillin-Sulbactam (UNASYN) 3 g in sodium chloride 0.9 % 100 mL IVPB     3 g 200 mL/hr over 30 Minutes Intravenous  Once 01/23/19 2123 01/24/19 0033         Medications  Scheduled Meds: . sodium chloride   Intravenous Once  . amiodarone  400 mg Oral BID  . fluticasone furoate-vilanterol  1 puff Inhalation Daily  . folic acid  1 mg Oral Daily  . [START ON 01/28/2019] furosemide  40 mg Oral Daily  . gabapentin  900 mg Oral TID  . levalbuterol  0.63 mg Nebulization BID  . methylPREDNISolone (SOLU-MEDROL) injection  40 mg Intravenous Q12H  . metoprolol tartrate  25 mg Oral BID  . multivitamin with minerals  1 tablet Oral Daily  . pantoprazole  40 mg Oral BID AC  . thiamine  100 mg Oral Daily   Or  . thiamine  100 mg Intravenous Daily  . umeclidinium bromide  1 puff Inhalation Daily   Continuous Infusions:  PRN Meds:.acetaminophen **OR** acetaminophen, fentaNYL (SUBLIMAZE) injection, levalbuterol, ondansetron **OR** ondansetron (ZOFRAN) IV, polyvinyl alcohol      Subjective:   Brittany Oliver was seen and examined today.  Overnight events noted, this morning during my encounter still in A. fib fibrillation with RVR with a heart  rate in 120s.  Afebrile this morning, overnight spiking temp.  Patient denies  chest pain, shortness of breath, abdominal pain, N/V/D/C, new weakness, numbess, tingling.   Objective:   Vitals:   01/26/19 2109 01/26/19 2124 01/27/19 0510 01/27/19 0751  BP: 131/75  132/73   Pulse: 67 (!) 125 98   Resp: 18  18   Temp: 98.7 F (37.1 C)     TempSrc: Oral  Oral   SpO2: 95%  96% 96%  Weight:      Height:        Intake/Output Summary (Last 24 hours) at 01/27/2019 1318 Last data filed at 01/27/2019 0209 Gross per 24 hour  Intake 500 ml  Output 1925 ml  Net -1425 ml     Wt Readings from Last 3 Encounters:  01/23/19 58 kg  01/17/18 58.5 kg  01/17/17 57.6 kg     Exam  General: Alert and oriented x 3, NAD  Eyes:   HEENT:  Atraumatic, normocephalic, normal oropharynx  Cardiovascular: Irregularly irregular, tachycardia  Respiratory: Clear to auscultation bilaterally, no wheezing, rales or rhonchi  Gastrointestinal: Soft, nontender, nondistended, + bowel sounds  Ext: no pedal edema bilaterally  Neuro: No new FND's  Musculoskeletal: No digital cyanosis, clubbing  Skin: No rashes  Psych: Normal affect and demeanor, alert and oriented x3    Data Reviewed:  I have personally reviewed following labs and imaging studies  Micro Results Recent Results (from the past 240 hour(s))  SARS CORONAVIRUS 2 (TAT 6-24 HRS) Nasopharyngeal Nasopharyngeal Swab     Status: None   Collection Time: 01/22/19 12:35 PM   Specimen: Nasopharyngeal Swab  Result Value Ref Range Status   SARS Coronavirus 2 NEGATIVE NEGATIVE Final    Comment: (NOTE) SARS-CoV-2 target nucleic acids are NOT DETECTED. The SARS-CoV-2 RNA is generally detectable in upper and lower respiratory specimens during the acute phase of infection. Negative results do not preclude SARS-CoV-2 infection, do not rule out co-infections with other pathogens, and should not be used as the sole basis for treatment or other patient  management decisions. Negative results must be combined with clinical observations, patient history, and epidemiological information. The expected result is Negative. Fact Sheet for Patients: SugarRoll.be Fact Sheet for Healthcare Providers: https://www.woods-mathews.com/ This test is not yet approved or cleared by the [place] FDA and  has been authorized for detection and/or diagnosis of SARS-CoV-2 by FDA under an Emergency Use Authorization (EUA). This EUA will remain  in effect (meaning this test can be used) for the duration of the COVID-19 declaration under Section 56 4(b)(1) of the Act, 21 U.S.C. section 360bbb-3(b)(1), unless the authorization is terminated or revoked sooner. Performed at [facility], [place] [address], [place], [place] [address]   Culture, blood (routine x 2)     Status: None (Preliminary result)   Collection Time: 01/23/19  6:52 PM   Specimen: BLOOD RIGHT ARM  Result Value Ref Range Status   Specimen Description   Final    BLOOD RIGHT ARM Performed at [place] [address] [place], [place] [address]    Special Requests   Final    BOTTLES DRAWN AEROBIC AND ANAEROBIC Blood Culture adequate volume Performed at [facility] [address], [place], [place] [address]    Culture   Final    NO GROWTH 4 DAYS Performed at [facility], [place] [address] [place], [place] [address]    Report Status PENDING  Incomplete  Culture, blood (routine x 2)     Status: None (Preliminary result)   Collection Time: 01/23/19  6:57 PM   Specimen: BLOOD  Result Value Ref Range Status   Specimen Description   Final    BLOOD LEFT ANTECUBITAL Performed at [facility]  [place] [address], [place], [place] [address]    Special Requests   Final    BOTTLES DRAWN AEROBIC AND ANAEROBIC Blood Culture adequate volume Performed at [place] [address] [place], [place] [address]    Culture   Final    NO GROWTH 4 DAYS Performed at [facility], [place] [address], [place], [place] [address]    Report Status PENDING  Incomplete    Radiology Reports Dg Chest 1 View  Result Date: 01/23/2019 CLINICAL DATA:  Shortness of breath status post EGD EXAM: CHEST  1 VIEW COMPARISON:  12/30/2014 FINDINGS: Right lung is grossly clear. Patchy airspace opacities within the left mid and lower lung. No pleural effusion. Normal heart size. No pneumothorax. IMPRESSION: Moderate patchy airspace opacity in the left mid to lower lung, possibly representing pneumonia. Electronically Signed   By: Donavan Foil M.D.   On: 01/23/2019 20:16   Dg Chest 2 View  Result Date: 01/27/2019 CLINICAL DATA:  78 year old female with fever. Concern for aspiration. EXAM: CHEST - 2 VIEW COMPARISON:  Chest radiograph dated 01/26/2019 FINDINGS: Small bilateral pleural effusions and bibasilar atelectasis versus infiltrate. Overall improved aeration of the lungs with decreased in the size of the pleural effusions compared to the radiograph of 01/26/2019. There is no pneumothorax. The cardiac silhouette is within normal limits. Atherosclerotic calcification of the aortic arch. No acute osseous pathology. IMPRESSION: Small bilateral pleural effusions and bibasilar atelectasis versus infiltrate. Overall improved aeration of the lungs compared to the radiograph of 01/26/2019. Electronically Signed   By: Anner Crete M.D.   On: 01/27/2019 11:06   Dg Abd 1 View  Result Date: 01/23/2019 CLINICAL DATA:  Shortness of breath after EGD EXAM: ABDOMEN - 1 VIEW COMPARISON:  None. FINDINGS: Patchy airspace disease left base. Mild diffuse increased bowel gas without obstructive pattern. No radiopaque calculi. Scoliosis and degenerative changes of the spine IMPRESSION: Nonobstructed bowel-gas pattern Electronically Signed   By: Donavan Foil M.D.   On: 01/23/2019 20:17   Dg Chest Port 1  View  Result Date: 01/26/2019 CLINICAL DATA:  Shortness of breath and weakness. EXAM: PORTABLE CHEST 1 VIEW COMPARISON:  Chest radiograph 01/25/2019 FINDINGS: Overlying monitoring leads. The cardiomediastinal silhouette is unchanged. Redemonstrated airspace disease within the bilateral mid to lower lungs as well as bilateral pleural effusions. The right-sided airspace disease and pleural effusion appear increased from prior exam. No evidence of pneumothorax. No acute bony abnormality. IMPRESSION: Bilateral airspace disease within the mid to lower lungs as well as bilateral pleural effusions, increased on the right as compared to prior exam. Electronically Signed   By: Kellie Simmering DO   On: 01/26/2019 08:55   Dg Chest Port 1 View  Result Date: 01/25/2019 CLINICAL DATA:  Shortness of breath EXAM: PORTABLE CHEST 1 VIEW COMPARISON:  Chest radiograph 01/23/2019 FINDINGS: Monitoring leads overlie the patient. Stable cardiac and mediastinal contours. Slight interval improvement left mid lower lung consolidation. New right lower lung consolidation. Small left greater than right bilateral pleural effusions. IMPRESSION: 1. Slight interval improvement left mid lower lung consolidation. 2. New right lower lung consolidation concerning for pneumonia or aspiration. 3. Small left greater than right pleural effusions. Electronically Signed   By: Lovey Newcomer M.D.   On: 01/25/2019 09:39    Lab Data:  CBC: Recent Labs  Lab 01/23/19 1946  01/24/19 0153 01/24/19 0925 01/25/19 0429 01/26/19 0339 01/27/19 0343  WBC 7.1  --   --  12.6* 8.5  8.3 5.3  HGB 8.4*   < > 7.3* 8.1* 9.8* 10.1* 10.2*  HCT 26.9*   < > 23.8* 27.1* 31.8* 32.9* 31.5*  MCV 97.8  --   --  102.3* 99.7 100.0 94.0  PLT 135*  --   --  154 150 180 208   < > = values in this interval not displayed.   Basic Metabolic Panel: Recent Labs  Lab 01/23/19 1949 01/24/19 0521 01/24/19 0925 01/25/19 0429 01/26/19 0339 01/27/19 0343  NA 139  --  139 138  141 138  K 3.2* 4.0 4.5 3.7 4.1 3.5  CL 108  --  111 109 108 97*  CO2 24  --  19* 23 25 30   GLUCOSE 127*  --  118* 118* 127* 150*  BUN 18  --  14 11 13 15   CREATININE 0.71  --  0.79 0.80 0.87 0.75  CALCIUM 8.0*  --  7.6* 8.0* 8.5* 8.6*  MG 1.6* 2.4 2.2  --   --   --   PHOS 1.7* 3.9  --   --   --   --    GFR: Estimated Creatinine Clearance: 46.6 mL/min (by C-G formula based on SCr of 0.75 mg/dL). Liver Function Tests: Recent Labs  Lab 01/22/19 1131 01/22/19 1840 01/23/19 0609  AST 19 15 12*  ALT 18 15 11   ALKPHOS 43 35* 33*  BILITOT 0.5 0.7 0.9  PROT 6.9 5.8* 5.2*  ALBUMIN 4.3 3.7 3.1*   Recent Labs  Lab 01/22/19 1131  LIPASE 26   No results for input(s): AMMONIA in the last 168 hours. Coagulation Profile: No results for input(s): INR, PROTIME in the last 168 hours. Cardiac Enzymes: No results for input(s): CKTOTAL, CKMB, CKMBINDEX, TROPONINI in the last 168 hours. BNP (last 3 results) No results for input(s): PROBNP in the last 8760 hours. HbA1C: No results for input(s): HGBA1C in the last 72 hours. CBG: No results for input(s): GLUCAP in the last 168 hours. Lipid Profile: No results for input(s): CHOL, HDL, LDLCALC, TRIG, CHOLHDL, LDLDIRECT in the last 72 hours. Thyroid Function Tests: Recent Labs    01/27/19 0343  TSH 0.325*   Anemia Panel: No results for input(s): VITAMINB12, FOLATE, FERRITIN, TIBC, IRON, RETICCTPCT in the last 72 hours. Urine analysis:    Component Value Date/Time   COLORURINE YELLOW 01/23/2019 1815   APPEARANCEUR HAZY (A) 01/23/2019 1815   LABSPEC 1.017 01/23/2019 1815   PHURINE 6.0 01/23/2019 1815   GLUCOSEU NEGATIVE 01/23/2019 1815   HGBUR NEGATIVE 01/23/2019 1815   [place] NEGATIVE 01/23/2019 [place] NEGATIVE 01/23/2019 1815   PROTEINUR 30 (A) 01/23/2019 1815   NITRITE NEGATIVE 01/23/2019 1815   LEUKOCYTESUR SMALL (A) 01/23/2019 1815     Rydell Wiegel M.D. [facility] 01/27/2019, 1:18 PM  Pager:  [phone] Between 7am to 7pm - call Pager - [phone]  After 7pm go to [email or web address] - password [email or web address]  Call night coverage person covering after 7pm

## 2019-01-27 NOTE — Consult Note (Addendum)
NAME:  Brittany Oliver, MRN:  [id number], DOB:  1941-01-03, LOS: 4 ADMISSION DATE:  01/22/2019, CONSULTATION DATE:  11/9 REFERRING MD:  Tana Coast, CHIEF COMPLAINT:  Hypoxia and wheezing    Brief History   78 year old female with history as mentioned below, admitted on 11/5 with upper GI bleed.  Course complicated by new onset paroxysmal atrial fibrillation first noted on 11/6, with new fever, and concern about possible aspiration pneumonia.  Over the next 72 hours had intermittent paroxysmal atrial fibrillation, further complicated by worsening dyspnea, wheezing and hypoxia.  Pulmonary asked to evaluate for wheezing and hypoxic respiratory failure.  Past Medical History  Former smoker,  COPD w/ FEV1 54% predicted (had 12% improvement s/p SABA); maintained previously on Spiriva and Symbicort.  HTN, HL, prior CVA, ETOH abuse  Significant Hospital Events    11/5  Admitted w/ UGIB.  hgb dropped as low as 7.2 and she therefore received 1 unit PRBCs 11/6.  11/6 EGD : found few non-bleeding catered gastric ulcers in pre-pyloric region of stomach felt 2/2 NSAIDs or H pylori.  11/6, spiked fever as high as 102.3, saturations as low as 88% on room air and went into af w/ RVR. HR 150s. Treated w/ rate control. PCXR was obtained showed bilateral airspace disease. She was placed on oxygen and empiric Unasyn started w/ concern for aspiration.  11/7 cardiology consulted. Had some hypotension after CCB. Got fluid.  ECHO w/ LVEF 60-65%nml RA and RV. Estimated R atrial pressure 51mmHg.  11/8 back into AF w/ RVR. Started on Flecainide. No AC d/t bleeding.  11/9 changed to amiodarone.  Wheezing worse. Oxygen requirements as high as 7 liters. Current I&O balance 1.9 liters positive. Getting lasix. PCXR w/ bilateral R>L airspace disease. Worse when comparing imaging from day prior. Now w/ element of effusion. PCCM asked to eval.  11/10 feeling better. Back in NSR. I&O balance: down to + 814 from +2 liters. Scr stable   Consults:  GI Cardiology Pulm  Procedures:    Significant Diagnostic Tests:  Echo 11/7: LVEF 60-65%nml RA and RV. Estimated R atrial pressure 80mmHg.   Micro Data:   Antimicrobials:  Unasyn 11/6 Interim history/subjective:  Feels much better.   Objective   Blood pressure 132/73, pulse 98, temperature 98.7 F (37.1 C), temperature source Oral, resp. rate 18, height 5\' 2"  (1.575 m), weight 58 kg, SpO2 96 %.        Intake/Output Summary (Last 24 hours) at 01/27/2019 [id number] Last data filed at 01/27/2019 0209 Gross per 24 hour  Intake 500 ml  Output 3925 ml  Net -3425 ml   Filed Weights   01/22/19 1945 01/23/19 1247  Weight: 58 kg 58 kg    Examination: General: 78 year old white female currently sitting up at bedside she is in no acute distress this morning HEENT normocephalic atraumatic no jugular venous distention is appreciated mucous membranes are moist sclera nonicteric Pulmonary: Clear to auscultation without accessory use.  Currently supplemental oxygen needs are down to 2 L nasal cannula Cardiac: Regular rate and rhythm Abdomen soft not tender no organomegaly Extremities warm dry brisk cap refill Neuro is intact  Resolved Hospital Problem list     Assessment & Plan:   Acute hypoxic respiratory failure Bilateral pulmonary infiltrates Probable aspiration pneumonitis Probable pleural effusion COPD Atrial fibrillation with rapid rate ventricular response, CHA2DS2-VASc score 6 Upper GI bleed Hypertension    Pulmonary discussion: Acute hypoxic respiratory failure in setting of bilateral pulm infiltrates. Favor initially aspiration pneumonitis now likely  complicated by element of pulmonary edema and possibly pleural effusions.   Discussion Remarkably improved with IV diuresis.  Still awaiting chest x-ray  Plan/rec Pulse oximetry, wean oxygen for saturations greater than 90% Walking oximetry prior to discharge Day #5 Unasyn can be discontinued after today  Agree with transitioning to oral Lasix, she may not need this at discharge Follow-up chest x-ray still pending Continue bronchodilators  We will set her up for outpatient follow-up  Erick Colace ACNP-BC [place] Pager # [phone] OR # [phone] if no answer

## 2019-01-27 NOTE — Plan of Care (Signed)

## 2019-01-28 DIAGNOSIS — I480 Paroxysmal atrial fibrillation: Secondary | ICD-10-CM

## 2019-01-28 DIAGNOSIS — I4891 Unspecified atrial fibrillation: Secondary | ICD-10-CM

## 2019-01-28 LAB — BASIC METABOLIC PANEL
Anion gap: 12 (ref 5–15)
BUN: 27 mg/dL — ABNORMAL HIGH (ref 8–23)
CO2: 25 mmol/L (ref 22–32)
Calcium: 9 mg/dL (ref 8.9–10.3)
Chloride: 99 mmol/L (ref 98–111)
Creatinine, Ser: 0.85 mg/dL (ref 0.44–1.00)
GFR calc Af Amer: >60 mL/min (ref >60)
GFR calc non Af Amer: >60 mL/min (ref >60)
Glucose, Bld: 130 mg/dL — ABNORMAL HIGH (ref 70–99)
Potassium: 3.6 mmol/L (ref 3.5–5.1)
Sodium: 136 mmol/L (ref 135–145)

## 2019-01-28 LAB — CULTURE, BLOOD (ROUTINE X 2)
Culture: NO GROWTH
Culture: NO GROWTH
Special Requests: ADEQUATE
Special Requests: ADEQUATE

## 2019-01-28 LAB — H. PYLORI ANTIGEN, STOOL: H. Pylori Stool Ag, Eia: NEGATIVE

## 2019-01-28 MED ORDER — PANTOPRAZOLE SODIUM 40 MG PO TBEC: 40.0000 mg | DELAYED_RELEASE_TABLET | Freq: Two times a day (BID) | ORAL | 1 refills | Status: DC

## 2019-01-28 MED ORDER — AMIODARONE HCL 200 MG PO TABS: ORAL_TABLET | ORAL | 0 refills | Status: DC

## 2019-01-28 MED ORDER — FUROSEMIDE 40 MG PO TABS: 40.0000 mg | ORAL_TABLET | Freq: Every day | ORAL | 0 refills | Status: DC

## 2019-01-28 MED ORDER — ALBUTEROL SULFATE HFA 108 (90 BASE) MCG/ACT IN AERS: 2.0000 | INHALATION_SPRAY | Freq: Four times a day (QID) | RESPIRATORY_TRACT | 1 refills | Status: DC | PRN

## 2019-01-28 MED ORDER — AMOXICILLIN-POT CLAVULANATE 875-125 MG PO TABS: 1.0000 | ORAL_TABLET | Freq: Two times a day (BID) | ORAL | 0 refills | Status: AC

## 2019-01-28 MED ORDER — METOPROLOL TARTRATE 25 MG PO TABS: 25.0000 mg | ORAL_TABLET | Freq: Two times a day (BID) | ORAL | 0 refills | Status: DC

## 2019-01-28 MED ORDER — FUROSEMIDE 40 MG PO TABS: 20.0000 mg | ORAL_TABLET | Freq: Every day | ORAL | 0 refills | Status: DC

## 2019-01-28 NOTE — Progress Notes (Addendum)
Progress Note  Patient Name: Brittany Oliver Date of Encounter: 01/28/2019  Primary Cardiologist: Jenkins Rouge, MD   Subjective   Had brief afib episode last night. Did felt going into afib. She was sleeping when covert back to sinus. No chest pain or shortness of breath.   Inpatient Medications    Scheduled Meds:  sodium chloride   Intravenous Once   amiodarone  400 mg Oral BID   fluticasone furoate-vilanterol  1 puff Inhalation Daily   folic acid  1 mg Oral Daily   furosemide  40 mg Oral Daily   gabapentin  900 mg Oral TID   levalbuterol  0.63 mg Nebulization BID   methylPREDNISolone (SOLU-MEDROL) injection  40 mg Intravenous Q12H   metoprolol tartrate  25 mg Oral BID   multivitamin with minerals  1 tablet Oral Daily   pantoprazole  40 mg Oral BID AC   thiamine  100 mg Oral Daily   Or   thiamine  100 mg Intravenous Daily   umeclidinium bromide  1 puff Inhalation Daily   Continuous Infusions:  PRN Meds: acetaminophen **OR** acetaminophen, fentaNYL (SUBLIMAZE) injection, levalbuterol, ondansetron **OR** ondansetron (ZOFRAN) IV, polyvinyl alcohol   Vital Signs    Vitals:   01/27/19 1959 01/27/19 2050 01/28/19 0530 01/28/19 0808  BP:  134/81 140/67   Pulse:  (!) 110 (!) 102   Resp:  18 18   Temp:  98.5 F (36.9 C) 98.7 F (37.1 C)   TempSrc:   Oral   SpO2: 92% (!) 87% 92% 93%  Weight:      Height:        Intake/Output Summary (Last 24 hours) at 01/28/2019 0936 Last data filed at 01/28/2019 0530 Gross per 24 hour  Intake 1040 ml  Output 2400 ml  Net -1360 ml   Last 3 Weights 01/23/2019 01/22/2019 01/17/2018  Weight (lbs) 127 lb 13.9 oz 127 lb 12.8 oz 129 lb  Weight (kg) 58 kg 57.97 kg 58.514 kg      Telemetry    Sr at rate of 70s, brief afib last night - Personally Reviewed  ECG    N/A  Physical Exam   GEN: Thin female in no acute distress.   Neck: No JVD Cardiac: RRR, no murmurs, rubs, or gallops.  Respiratory: Clear to  auscultation bilaterally. GI: Soft, nontender, non-distended  MS: No edema; No deformity. Neuro:  Nonfocal  Psych: Normal affect   Labs    High Sensitivity Troponin:   Recent Labs  Lab 01/23/19 2005  TROPONINIHS 14      Chemistry Recent Labs  Lab 01/22/19 1131 01/22/19 1840 01/23/19 0609  01/26/19 0339 01/27/19 0343 01/28/19 0402  NA 132* 138 141   < > 141 138 136  K 3.2* 4.3 3.8   < > 4.1 3.5 3.6  CL 95* 107 110   < > 108 97* 99  CO2 24 24 25    < > 25 30 25   GLUCOSE 172* 112* 118*   < > 127* 150* 130*  BUN 54* 42* 27*   < > 13 15 27*  CREATININE 1.10* 0.86 0.81   < > 0.87 0.75 0.85  CALCIUM 8.8* 8.5* 8.0*   < > 8.5* 8.6* 9.0  PROT 6.9 5.8* 5.2*  --   --   --   --   ALBUMIN 4.3 3.7 3.1*  --   --   --   --   AST 19 15 12*  --   --   --   --  ALT 18 15 11   --   --   --   --   ALKPHOS 43 35* 33*  --   --   --   --   BILITOT 0.5 0.7 0.9  --   --   --   --   GFRNONAA 48* >60 >60   < > >60 >60 >60  GFRAA 56* >60 >60   < > >60 >60 >60  ANIONGAP 13 7 6    < > 8 11 12    < > = values in this interval not displayed.     Hematology Recent Labs  Lab 01/25/19 0429 01/26/19 0339 01/27/19 0343  WBC 8.5 8.3 5.3  RBC 3.19* 3.29* 3.35*  HGB 9.8* 10.1* 10.2*  HCT 31.8* 32.9* 31.5*  MCV 99.7 100.0 94.0  MCH 30.7 30.7 30.4  MCHC 30.8 30.7 32.4  RDW 15.2 15.0 13.9  PLT 150 180 208    BNP Recent Labs  Lab 01/25/19 0822  BNP 2,451.0*     Radiology    Dg Chest 2 View  Result Date: 01/27/2019 CLINICAL DATA:  78 year old female with fever. Concern for aspiration. EXAM: CHEST - 2 VIEW COMPARISON:  Chest radiograph dated 01/26/2019 FINDINGS: Small bilateral pleural effusions and bibasilar atelectasis versus infiltrate. Overall improved aeration of the lungs with decreased in the size of the pleural effusions compared to the radiograph of 01/26/2019. There is no pneumothorax. The cardiac silhouette is within normal limits. Atherosclerotic calcification of the aortic arch. No  acute osseous pathology. IMPRESSION: Small bilateral pleural effusions and bibasilar atelectasis versus infiltrate. Overall improved aeration of the lungs compared to the radiograph of 01/26/2019. Electronically Signed   By: Anner Crete M.D.   On: 01/27/2019 11:06    Cardiac Studies   ECHO: 01/24/2019 1. Left ventricular ejection fraction, by visual estimation, is 60 to 65%. The left ventricle has normal function. There is no left ventricular hypertrophy. 2. Left ventricular diastolic parameters are indeterminate. 3. Global right ventricle has normal systolic function.The right ventricular size is normal. No increase in right ventricular wall thickness. 4. Left atrial size was normal. 5. Right atrial size was normal. 6. The mitral valve is normal in structure. Trace mitral valve regurgitation. No evidence of mitral stenosis. 7. The tricuspid valve is normal in structure. Tricuspid valve regurgitation is trivial. 8. The aortic valve is normal in structure. Aortic valve regurgitation is not visualized. No evidence of aortic valve sclerosis or stenosis. 9. The pulmonic valve was grossly normal. Pulmonic valve regurgitation is trivial. 10. Normal pulmonary artery systolic pressure. 11. The inferior vena cava is normal in size with greater than 50% respiratory variability, suggesting right atrial pressure of 3 mmHg. 12. Patient converted to NSR during exam.  Patient Profile     78 y.o. female with hx of CVA in 1987, HLD, HTN who was admitted 01/22/2019 with N&V, diarrhea and GIB. Hospital complicated by afib RVR on 01/24/2019.   Assessment & Plan    1. Atrial fibrillation RVR - She was initially started on Flecainide on 11/7>> intermittent afib>> switched to amiodarone load on 11/9>> converted to sinus with brief episode of afib last night. She can tell when converts.  - Echo with preserved LV function and intermediate diastolic dysfunction. Abnormal TSH. Continue BB and  -  CHADSVASCs score of 6. Start anticoagulation when okay with GI.   2. Abnormal thyroid function - TSH mildly low and free T4 minimally up - Per primary team   3. Acute hypoxic respiratory failure -  Resolved  4. Volume overload - Echo as above - diuresed 1L yesterday with net I & O -186cc - Now on Po lasix 40mg  daily  [facility] will sign off.   Medication Recommendations:  Continue amiodarone 400mg  BID x 1 week total then 200mg  daily, metoprolol 25mg  BID, lasix dose per MD Other recommendations (labs, testing, etc):  Start anticoagulation, Eliquis 5mg  BID, when okay with GI Follow up as an outpatient:  Will arrange  For questions or updates, please contact [place] Please consult [email or web address] for contact info under        Signed, Leanor Kail, PA  01/28/2019, 9:36 AM    As above, patient seen and examined.  She remains in sinus rhythm and her volume status is much improved.  She can be discharged on amiodarone 400 mg twice daily for 1 week then 200 mg daily thereafter.  Continue metoprolol.  I would treat with Lasix 20 mg daily as needed.  She is not volume overloaded on examination and LV function normal.  As outlined previously we have not anticoagulated given recent GI bleed but this will need to be addressed as an outpatient.  She should follow-up with APP in 1 to 2 weeks after discharge and then Dr. Johnsie Cancel in 3 months.  Note her TSH is mildly decreased and free T4 mildly increased.  These will need to be repeated following discharge.  Kirk Ruths

## 2019-01-28 NOTE — Discharge Instructions (Signed)
-  Take Protonix 40 mg twice daily -Take Augmentin twice daily  For 1 day

## 2019-01-28 NOTE — Progress Notes (Signed)
Patient given discharge, follow up, and medication instructions, verbalized understanding, IV and telemetry monitor removed, personal belongings with patient, family to transport home  

## 2019-01-28 NOTE — Discharge Summary (Addendum)
Physician Discharge Summary  Brittany Oliver NAT:[id number] DOB: 03-21-1940 DOA: 01/22/2019  PCP: [name], MD  Admit date: 01/22/2019 Discharge date: 01/28/2019   Code Status: Full Code  Admitted From: Home Discharged to: [place]: No Equipment/Devices: No Discharge Condition: Stable  Recommendations for Outpatient Follow-up   Follow up with PCP in 1 week Please follow up BMP/CBC  Patient needs follow-up with GI and cardio outpatient.  She will need to be reassessed as to timing of starting anticoagulation Reassess starting aspirin as outpatient, this was held in setting of GI bleed discharge Med adjustments: Aspirin held at discharge, started on metoprolol 25 mg twice daily, Lasix 20 mg daily, amiodarone 400 mg twice daily x1 week followed by 200 mg  Hospital Summary  Acute blood loss anemia with upper GI bleed Patient was placed on PPI IV drip.  Hemoglobin 9.0-> 7.2, received 1 unit of packed RBCs, hemoglobin 10.2 at discharge GI was consulted, underwent EGD which showed nonbleeding gastric ulcers with no stigmata of bleeding, highly likely source of patient's recent bleeding, NSAIDs versus H. pylori most likely etiologies.  Normal duodenum. -Discharged on Protonix 40 mg twice daily -Needs to follow-up with GI outpatient and discussion as to when to start anticoagulation with CHA2DS2-VASc: 6   Paroxysmal atrial fibrillation with RVR-new diagnosis converted to atrial fibrillation with RVR, heart rate in 150s.  Was placed on beta-blocker, however was still in the controlled.  She was given 1 dose of IV Cardizem 20 mg, patient converted from A. fib to normal sinus rhythm however had a 4.9-second pause, BP plummeted down to 77/54 and heart rate down to 50s.  Patient received IV fluid bolus to support. Cardiology was consulted, recommended lower Lopressor dose, not anticoagulation candidate due to GI bleeding.  CHA2DS2-VASc: 6.  2D echo showed EF of 60 to 65% with no LVH.   She was trialed on flecainide did not tolerate.  On 11/9 flecainide was discontinued and started on amiodarone 400 mg twice daily x1 week and 200 mg daily and elevated increased metoprolol by cardiology. -Discharged on metoprolol 25 mg twice daily -Discharged on amiodarone 40 mg twice daily for total of 1 week followed by 200 mg daily -Needs follow-up with cardiology outpatient  Borderline hyperthyroidism in setting of A. fib TSH 0.325, free T4 1.16.  A. fib controlled as above -Reassess outpatient, consider endocrinology referral   Left lung pneumonia Post ED EGD, patient was found to have shortness of breath possibly could be from A. fib with RVR however chest x-ray showed moderate patchy airspace opacity in the left mid to lower lung. -Leukocytosis, with fevers, 101.5 F.  was tachycardiac, hypotensive, met sepsis criteria.  She was continued on IV Unasyn and pulmonology was consulted.  Completed 16 doses Unasyn and had resolution of fevers, leukocytosis and vital signs improved with resolution of sepsis prior to discharge. -Discharged with 1 day Augmentin   History of CVA Holding aspirin at this time, now has new paroxysmal A. fib, no AC due to GI bleed -Continue statin -Aspirin held at discharge setting of GI bleed, reassess outpatient   History of alcohol use Monitored on CIWA protocol   Mild AKI Resolved   Hypokalemia resolved   Facial pain Hold NSAIDs, continue Neurontin.  Active Problems:   Atypical facial pain   COPD mixed type (HCC)   GI bleed   Hypertension   Hyperlipidemia   Stroke ([place])   Anemia due to acute blood loss   Neuralgia facialis vera  Paroxysmal atrial fibrillation [facility])    Consultants  PCCM Etiology  Procedures  EGD  Antibiotics  Completed 4 days of Unasyn   Subjective  Patient seen and examined at bedside no acute distress and resting comfortably.  No events overnight.  Tolerating diet. In good spirits and anticipating discharge.   Ambulated to and from bathroom on room air without shortness of breath  Denies any chest pain, shortness of breath, fever, nausea, vomiting. admits to constipation x2 days.  Otherwise ROS negative   Objective   Discharge Exam: Vitals:   01/28/19 0530 01/28/19 0808  BP: 140/67   Pulse: (!) 102   Resp: 18   Temp: 98.7 F (37.1 C)   SpO2: 92% 93%   Vitals:   01/27/19 1959 01/27/19 2050 01/28/19 0530 01/28/19 0808  BP:  134/81 140/67   Pulse:  (!) 110 (!) 102   Resp:  18 18   Temp:  98.5 F (36.9 C) 98.7 F (37.1 C)   TempSrc:   Oral   SpO2: 92% (!) 87% 92% 93%  Weight:      Height:        Physical Exam Vitals signs and nursing note reviewed.  Constitutional:      Appearance: Normal appearance.  HENT:     Head: Normocephalic and atraumatic.     Nose: Nose normal.     Mouth/Throat:     Mouth: Mucous membranes are moist.  Eyes:     Extraocular Movements: Extraocular movements intact.  Neck:     Musculoskeletal: Normal range of motion. No neck rigidity.  Cardiovascular:     Rate and Rhythm: Normal rate and regular rhythm.  Pulmonary:     Effort: Pulmonary effort is normal.     Comments: + Left basilar rales Abdominal:     General: Abdomen is flat.     Palpations: Abdomen is soft.  Musculoskeletal: Normal range of motion.     Comments: Bilateral lower extremity trace pitting edema  Neurological:     General: No focal deficit present.     Mental Status: She is alert. Mental status is at baseline.  Psychiatric:        Mood and Affect: Mood normal.        Behavior: Behavior normal.       The results of significant diagnostics from this hospitalization (including imaging, microbiology, ancillary and laboratory) are listed below for reference.     Microbiology: Recent Results (from the past 240 hour(s))  SARS CORONAVIRUS 2 (TAT 6-24 HRS) Nasopharyngeal Nasopharyngeal Swab     Status: None   Collection Time: 01/22/19 12:35 PM   Specimen: Nasopharyngeal Swab   Result Value Ref Range Status   SARS Coronavirus 2 NEGATIVE NEGATIVE Final    Comment: (NOTE) SARS-CoV-2 target nucleic acids are NOT DETECTED. The SARS-CoV-2 RNA is generally detectable in upper and lower respiratory specimens during the acute phase of infection. Negative results do not preclude SARS-CoV-2 infection, do not rule out co-infections with other pathogens, and should not be used as the sole basis for treatment or other patient management decisions. Negative results must be combined with clinical observations, patient history, and epidemiological information. The expected result is Negative. Fact Sheet for Patients: SugarRoll.be Fact Sheet for Healthcare Providers: https://www.woods-mathews.com/ This test is not yet approved or cleared by the [place] FDA and  has been authorized for detection and/or diagnosis of SARS-CoV-2 by FDA under an Emergency Use Authorization (EUA). This EUA will remain  in effect (meaning this test  can be used) for the duration of the COVID-19 declaration under Section 56 4(b)(1) of the Act, 21 U.S.C. section 360bbb-3(b)(1), unless the authorization is terminated or revoked sooner. Performed at [facility], [place] [address], [place], [place] [address]   Culture, blood (routine x 2)     Status: None   Collection Time: 01/23/19  6:52 PM   Specimen: BLOOD RIGHT ARM  Result Value Ref Range Status   Specimen Description   Final    BLOOD RIGHT ARM Performed at [place] [address] [place], [place] [address]    Special Requests   Final    BOTTLES DRAWN AEROBIC AND ANAEROBIC Blood Culture adequate volume Performed at [place] [address] [place], [place] [address]    Culture   Final    NO GROWTH 5 DAYS Performed at [facility], [place] [address], [place], [place] [address]    Report Status 01/28/2019 FINAL  Final  Culture, blood  (routine x 2)     Status: None   Collection Time: 01/23/19  6:57 PM   Specimen: BLOOD  Result Value Ref Range Status   Specimen Description   Final    BLOOD LEFT ANTECUBITAL Performed at [place] [address], [place], [place] [address]    Special Requests   Final    BOTTLES DRAWN AEROBIC AND ANAEROBIC Blood Culture adequate volume Performed at [facility] [address], [place], [place] [address]    Culture   Final    NO GROWTH 5 DAYS Performed at [facility], [place] [address] [place], [place] [address]    Report Status 01/28/2019 FINAL  Final     Labs: BNP (last 3 results) Recent Labs    01/25/19 0822  BNP 4,765.4*   Basic Metabolic Panel: Recent Labs  Lab 01/23/19 1949 01/24/19 0521 01/24/19 0925 01/25/19 0429 01/26/19 0339 01/27/19 0343 01/28/19 0402  NA 139  --  139 138 141 138 136  K 3.2* 4.0 4.5 3.7 4.1 3.5 3.6  CL 108  --  111 109 108 97* 99  CO2 24  --  19* 23 25 30 25   GLUCOSE 127*  --  118* 118* 127* 150* 130*  BUN 18  --  14 11 13 15  27*  CREATININE 0.71  --  0.79 0.80 0.87 0.75 0.85  CALCIUM 8.0*  --  7.6* 8.0* 8.5* 8.6* 9.0  MG 1.6* 2.4 2.2  --   --   --   --   PHOS 1.7* 3.9  --   --   --   --   --    Liver Function Tests: Recent Labs  Lab 01/22/19 1131 01/22/19 1840 01/23/19 0609  AST 19 15 12*  ALT 18 15 11   ALKPHOS 43 35* 33*  BILITOT 0.5 0.7 0.9  PROT 6.9 5.8* 5.2*  ALBUMIN 4.3 3.7 3.1*   Recent Labs  Lab 01/22/19 1131  LIPASE 26   No results for input(s): AMMONIA in the last 168 hours. CBC: Recent Labs  Lab 01/23/19 1946  01/24/19 0153 01/24/19 0925 01/25/19 0429 01/26/19 0339 01/27/19 0343  WBC 7.1  --   --  12.6* 8.5 8.3 5.3  HGB 8.4*   < > 7.3* 8.1* 9.8* 10.1* 10.2*  HCT 26.9*   < > 23.8* 27.1* 31.8* 32.9* 31.5*  MCV 97.8  --   --  102.3* 99.7 100.0 94.0  PLT 135*  --   --  154 150 180 208   < > = values in this interval not displayed.   Cardiac Enzymes: No  results for input(s): CKTOTAL, CKMB, CKMBINDEX, TROPONINI in the last 168 hours. BNP: Invalid input(s): POCBNP CBG: No results for input(s): GLUCAP in the last 168 hours. D-Dimer No results for input(s): DDIMER in the last 72 hours. Hgb A1c No results for input(s): HGBA1C in the last 72 hours. Lipid Profile No results for input(s): CHOL, HDL, LDLCALC, TRIG, CHOLHDL, LDLDIRECT in the last 72 hours. Thyroid function studies Recent Labs    01/27/19 0343  TSH 0.325*   Anemia work up No results for input(s): VITAMINB12, FOLATE, FERRITIN, TIBC, IRON, RETICCTPCT in the last 72 hours. Urinalysis    Component Value Date/Time   COLORURINE YELLOW 01/23/2019 1815   APPEARANCEUR HAZY (A) 01/23/2019 1815   LABSPEC 1.017 01/23/2019 1815   PHURINE 6.0 01/23/2019 1815   GLUCOSEU NEGATIVE 01/23/2019 1815   HGBUR NEGATIVE 01/23/2019 1815   BILIRUBINUR NEGATIVE 01/23/2019 1815   KETONESUR NEGATIVE 01/23/2019 1815   PROTEINUR 30 (A) 01/23/2019 1815   NITRITE NEGATIVE 01/23/2019 1815   LEUKOCYTESUR SMALL (A) 01/23/2019 1815   Sepsis Labs Invalid input(s): PROCALCITONIN,  WBC,  LACTICIDVEN Microbiology Recent Results (from the past 240 hour(s))  SARS CORONAVIRUS 2 (TAT 6-24 HRS) Nasopharyngeal Nasopharyngeal Swab     Status: None   Collection Time: 01/22/19 12:35 PM   Specimen: Nasopharyngeal Swab  Result Value Ref Range Status   SARS Coronavirus 2 NEGATIVE NEGATIVE Final    Comment: (NOTE) SARS-CoV-2 target nucleic acids are NOT DETECTED. The SARS-CoV-2 RNA is generally detectable in upper and lower respiratory specimens during the acute phase of infection. Negative results do not preclude SARS-CoV-2 infection, do not rule out co-infections with other pathogens, and should not be used as the sole basis for treatment or other patient management decisions. Negative results must be combined with clinical observations, patient history, and epidemiological information. The expected result is  Negative. Fact Sheet for Patients: SugarRoll.be Fact Sheet for Healthcare Providers: https://www.woods-mathews.com/ This test is not yet approved or cleared by the [place] FDA and  has been authorized for detection and/or diagnosis of SARS-CoV-2 by FDA under an Emergency Use Authorization (EUA). This EUA will remain  in effect (meaning this test can be used) for the duration of the COVID-19 declaration under Section 56 4(b)(1) of the Act, 21 U.S.C. section 360bbb-3(b)(1), unless the authorization is terminated or revoked sooner. Performed at [facility], [place] [address] [place], [place] [address]   Culture, blood (routine x 2)     Status: None   Collection Time: 01/23/19  6:52 PM   Specimen: BLOOD RIGHT ARM  Result Value Ref Range Status   Specimen Description   Final    BLOOD RIGHT ARM Performed at [place] [address], [place], [place] [address]    Special Requests   Final    BOTTLES DRAWN AEROBIC AND ANAEROBIC Blood Culture adequate volume Performed at [place] [address], [place], [place] [address]    Culture   Final    NO GROWTH 5 DAYS Performed at [facility], [place] [address], [place], [place] [address]    Report Status 01/28/2019 FINAL  Final  Culture, blood (routine x 2)     Status: None   Collection Time: 01/23/19  6:57 PM   Specimen: BLOOD  Result Value Ref Range Status   Specimen Description   Final  BLOOD LEFT ANTECUBITAL Performed at [place] [address], [place], [place] [address]    Special Requests   Final    BOTTLES DRAWN AEROBIC AND ANAEROBIC Blood Culture adequate volume Performed at [place] [address], [place], [place] [address]    Culture   Final    NO GROWTH 5 DAYS Performed at [facility], [place] [address] [place], [place] [address]    Report Status 01/28/2019 FINAL   Final    Discharge Instructions     Discharge Instructions     Diet - low sodium heart healthy   Complete by: As directed    Discharge instructions   Complete by: As directed    You were seen and examined in the hospital for GI bleed, pneumonia and atrial fibrillation and cared for by a hospitalist.   Upon Discharge:  1.  Stop taking aspirin until you follow-up with your primary care physician within 1 week and you are cleared to restart this medication in setting of recent GI bleed 2.  Take amiodarone 400 mg twice daily for the next 3-1/2 days (first dose evening 11/11, last dose 11/14 evening), followed by amiodarone 200 mg once daily starting 11/15 3.  Start taking metoprolol 25 mg twice daily 4.  Start taking Lasix 40 mg once daily 5. Make an appointment with your primary care physician within 7 days 6. Get lab work prior to your follow up appointment with your PCP 7. Bring all home medications to your appointment to review 8. Request that your primary physician go over all hospital tests and procedures/radiological results at the follow up.   9. Please get all hospital records sent to your physician by signing a hospital release before you go home.     Read the complete instructions along with all the possible side effects for all the medicines you take and that have been prescribed to you. Take any new medicines after you have completely understood and accept all the possible adverse reactions/side effects.   If you have any questions about your discharge medications or the care you received while you were in the hospital, you can call the unit and asked to speak with the hospitalist on call. Once you are discharged, your primary care physician will handle any further medical issues. Please note that NO REFILLS for any discharge medications will be authorized, as it is imperative that you return to your primary care physician (or establish a relationship with a primary care  physician if you do not have one) for your aftercare needs so that they can reassess your need for medications and monitor your lab values.   Do not drive, operate heavy machinery, perform activities at heights, swimming or participation in water activities or provide baby sitting services if your were admitted for loss of consciousness/seizures or if you are on sedating medications including, but not limited to benzodiazepines, sleep medications, narcotic pain medications, etc., until you have been cleared to do so by a medical doctor.    Do not take more than prescribed medications.   Wear a seat belt while driving.  If you have smoked or chewed Tobacco  in the last 2 years please stop smoking; also stop any regular Alcohol and/or any Recreational drug use including marijuana.  If you experience worsening of your admission symptoms or develop shortness of breath, chest pain, suicidal or homicidal thoughts or experience a life threatening emergency, you must seek medical attention immediately by  calling 911 or calling your PCP immediately.   Increase activity slowly   Complete by: As directed       Allergies as of 01/28/2019   No Known Allergies      Medication List     STOP taking these medications    aspirin 81 MG chewable tablet   ibuprofen 200 MG tablet Commonly known as: ADVIL       TAKE these medications    albuterol 108 (90 Base) MCG/ACT inhaler Commonly known as: VENTOLIN HFA Inhale 2 puffs into the lungs every 6 (six) hours as needed for wheezing or shortness of breath.   amiodarone 200 MG tablet Commonly known as: PACERONE Take 2 tablets (400 mg total) by mouth 2 (two) times daily for 4 days, THEN 1 tablet (200 mg total) daily. Start taking on: January 28, 2019   amoxicillin-clavulanate 875-125 MG tablet Commonly known as: Augmentin Take 1 tablet by mouth 2 (two) times daily for 1 day.   Calcium 600/Vitamin D 600-400 MG-UNIT chew tablet Generic drug:  Calcium Carbonate-Vitamin D Chew 1 tablet by mouth daily.   Flector 1.3 % Ptch Generic drug: diclofenac Place 1 patch onto the skin daily.   furosemide 40 MG tablet Commonly known as: LASIX Take 0.5 tablets (20 mg total) by mouth daily. Start taking on: January 29, 2019   gabapentin 300 MG capsule Commonly known as: NEURONTIN Take 1 capsule (300 mg total) by mouth 3 (three) times daily. What changed: Another medication with the same name was removed. Continue taking this medication, and follow the directions you see here.   metoprolol tartrate 25 MG tablet Commonly known as: LOPRESSOR Take 1 tablet (25 mg total) by mouth 2 (two) times daily.   ondansetron 8 MG tablet Commonly known as: ZOFRAN Take 8 mg by mouth once.   pantoprazole 40 MG tablet Commonly known as: PROTONIX Take 1 tablet (40 mg total) by mouth 2 (two) times daily before a meal.   polyvinyl alcohol 1.4 % ophthalmic solution Commonly known as: LIQUIFILM TEARS Place 1 drop into both eyes as needed for dry eyes.   pravastatin 80 MG tablet Commonly known as: PRAVACHOL Take 80 mg by mouth daily.   Trelegy Ellipta 100-62.5-25 MCG/INH Aepb Generic drug: Fluticasone-Umeclidin-Vilant Inhale into the lungs.   valsartan-hydrochlorothiazide 320-12.5 MG tablet Commonly known as: DIOVAN-HCT Take 1 tablet by mouth daily.   vitamin C 500 MG tablet Commonly known as: ASCORBIC ACID Take 500 mg by mouth daily.       Follow-up Information     Spero Geralds, MD Follow up on 02/06/2019.   Specialty: Pulmonary Disease Why: 230pm  Contact information: [address] [place] [address] [phone]           No Known Allergies  Time coordinating discharge: Over 30 minutes   SIGNED:   Harold Hedge, D.O. [facility] Pager: [phone]  01/28/2019, 10:42 AM

## 2019-02-02 NOTE — Anesthesia Postprocedure Evaluation (Signed)
Anesthesia Post Note  Patient: Brittany Oliver  Procedure(s) Performed: ESOPHAGOGASTRODUODENOSCOPY (EGD) WITH PROPOFOL (Left )     Patient location during evaluation: PACU Anesthesia Type: MAC Level of consciousness: awake and alert Pain management: pain level controlled Vital Signs Assessment: post-procedure vital signs reviewed and stable Respiratory status: spontaneous breathing, nonlabored ventilation, respiratory function stable and patient connected to nasal cannula oxygen Cardiovascular status: stable and blood pressure returned to baseline Postop Assessment: no apparent nausea or vomiting Anesthetic complications: no    Last Vitals:  Vitals:   01/28/19 0530 01/28/19 0808  BP: 140/67   Pulse: (!) 102   Resp: 18   Temp: 37.1 C   SpO2: 92% 93%    Last Pain:  Vitals:   01/28/19 0855  TempSrc:   PainSc: 0-No pain                 Colby Catanese S

## 2019-02-04 ENCOUNTER — Inpatient Hospital Stay: Payer: Medicare Other | Admitting: Internal Medicine

## 2019-02-04 NOTE — Progress Notes (Signed)
Cardiology Office Note    Date:  02/05/2019   ID:  Brittany Oliver, DOB 24-Feb-1941, MRN [id number]  PCP:  [name], MD  Cardiologist: Dr. Johnsie Cancel   Chief Complaint: Hospital follow up  History of Present Illness:   Brittany Oliver is a 78 y.o. female with hx of CVA in 1987, [place], HTN who was admitted 01/22/2019 with N&V, diarrhea and GIB. Hospital complicated by afib RVR on 01/24/2019. She was initially started on Flecainide on 11/7>> intermittent afib>> switched to amiodarone load on 11/9>> converted to sinus.  She can tell when converts.  Echo with preserved LV function and intermediate diastolic dysfunction. Abnormal TSH.  CHADSVASCs score of 6. Start anticoagulation when okay with GI.   Here today for follow up.  Patient is feeling well.  Yesterday she felt like normal first time.  She denies chest pain, shortness of breath, palpitation, dizziness, orthopnea, PND, syncope or lower extremity edema.  She brought vital records with her: Normal blood pressure however pulse in the high 40s to mid 50s.   Past Medical History:  Diagnosis Date  . Arthritis   . Cancer ([place]) 02/2013   skin melanoma  . Heart murmur   . Hyperlipidemia   . Hypertension   . Neuralgia facialis vera   . Stroke [facility]) 1987   has a little balance issue since stroke    Past Surgical History:  Procedure Laterality Date  . ABDOMINAL HYSTERECTOMY  1974   pt still has her uterus, took only 1 ovary and 1 tube  . APPENDECTOMY    . COLONOSCOPY    . DILATION AND CURETTAGE OF UTERUS  1965  . ESOPHAGOGASTRODUODENOSCOPY (EGD) WITH PROPOFOL Left 01/23/2019   Procedure: ESOPHAGOGASTRODUODENOSCOPY (EGD) WITH PROPOFOL;  Surgeon: Arta Silence, MD;  [facility];  Service: Endoscopy;  Laterality: Left;  . EXCISION MELANOMA WITH SENTINEL LYMPH NODE BIOPSY Left 03/27/2013   Procedure: EXCISION MELANOMA LEFT POSTERIOR ARM WITH LEFT AXILLARY SENTINEL LYMPH NODE BIOPSY;  Surgeon: Zenovia Jarred, MD;   Location: [place];  Service: General;  Laterality: Left;  . EYE SURGERY Bilateral 2006   cataract and laser surgery  . FINGER SURGERY Right    middle finger  . MASS EXCISION Left 09/15/2013   Procedure: EXCISION MASS LEFT UPPER ARM;  Surgeon: Zenovia Jarred, MD;  Location: [place];  Service: General;  Laterality: Left;  . TONSILLECTOMY      Current Medications: Prior to Admission medications   Medication Sig Start Date End Date Taking? Authorizing Provider  albuterol (VENTOLIN HFA) 108 (90 Base) MCG/ACT inhaler Inhale 2 puffs into the lungs every 6 (six) hours as needed for wheezing or shortness of breath. 01/28/19   Harold Hedge, MD  amiodarone (PACERONE) 200 MG tablet Take 2 tablets (400 mg total) by mouth 2 (two) times daily for 4 days, THEN 1 tablet (200 mg total) daily. 01/28/19 04/02/19  Harold Hedge, MD  Calcium Carbonate-Vitamin D (CALCIUM 600/VITAMIN D) 600-400 MG-UNIT chew tablet Chew 1 tablet by mouth daily.    [provider]  FLECTOR 1.3 % PTCH Place 1 patch onto the skin daily.  11/23/14   [provider]  Fluticasone-Umeclidin-Vilant (TRELEGY ELLIPTA) 100-62.5-25 MCG/INH AEPB Inhale into the lungs.    [provider]  furosemide (LASIX) 40 MG tablet Take 0.5 tablets (20 mg total) by mouth daily. 01/29/19   Harold Hedge, MD  gabapentin (NEURONTIN) 300 MG capsule Take 1 capsule (300 mg total) by mouth 3 (three)  times daily. 04/17/18   Pieter Partridge, DO  metoprolol tartrate (LOPRESSOR) 25 MG tablet Take 1 tablet (25 mg total) by mouth 2 (two) times daily. 01/28/19   Harold Hedge, MD  ondansetron (ZOFRAN) 8 MG tablet Take 8 mg by mouth once.    [provider]  pantoprazole (PROTONIX) 40 MG tablet Take 1 tablet (40 mg total) by mouth 2 (two) times daily before a meal. 01/28/19   Harold Hedge, MD  polyvinyl alcohol (LIQUIFILM TEARS) 1.4 % ophthalmic solution Place 1 drop into both eyes as needed for dry eyes.    [provider]   pravastatin (PRAVACHOL) 80 MG tablet Take 80 mg by mouth daily.    [provider]  valsartan-hydrochlorothiazide (DIOVAN-HCT) 320-12.5 MG tablet Take 1 tablet by mouth daily. 11/22/18   [provider]  vitamin C (ASCORBIC ACID) 500 MG tablet Take 500 mg by mouth daily.    [provider]    Allergies:   Patient has no known allergies.   Social History   Socioeconomic History  . Marital status: Married    Spouse name: Not on file  . Number of children: Not on file  . Years of education: Not on file  . Highest education level: Not on file  Occupational History  . Not on file  Social Needs  . Financial resource strain: Not on file  . Food insecurity    Worry: Not on file    Inability: Not on file  . Transportation needs    Medical: Not on file    Non-medical: Not on file  Tobacco Use  . Smoking status: Former Smoker    Packs/day: 0.50    Years: 43.20    Pack years: 21.60    Types: Cigarettes    Quit date: 03/23/2005    Years since quitting: 13.8  . Smokeless tobacco: Never Used  . Tobacco comment: QUIT 9 YEARS AGO  Substance and Sexual Activity  . Alcohol use: Yes    Alcohol/week: 5.0 standard drinks    Types: 5 Glasses of wine per week  . Drug use: No  . Sexual activity: Yes    Partners: Male  Lifestyle  . Physical activity    Days per week: Not on file    Minutes per session: Not on file  . Stress: Not on file  Relationships  . Social Herbalist on phone: Not on file    Gets together: Not on file    Attends religious service: Not on file    Active member of club or organization: Not on file    Attends meetings of clubs or organizations: Not on file    Relationship status: Not on file  Other Topics Concern  . Not on file  Social History Narrative  . Not on file     Family History:  The patient's family history is not on file. She was adopted.   ROS:   Please see the history of present illness.    ROS All other systems  reviewed and are negative.   PHYSICAL EXAM:   VS:  BP 116/60   Pulse (!) 50   Ht 5\' 2"  (1.575 m)   Wt 127 lb (57.6 kg)   SpO2 96%   BMI 23.23 kg/m    GEN: Well nourished, well developed, in no acute distress  HEENT: normal  Neck: no JVD, carotid bruits, or masses Cardiac: RRR; no murmurs, rubs, or gallops,no edema  Respiratory:  clear to auscultation bilaterally, normal work of breathing GI: soft, nontender, nondistended, + BS MS: no deformity or atrophy  Skin: warm and dry, no rash Neuro:  Alert and Oriented x 3, Strength and sensation are intact Psych: euthymic mood, full affect  Wt Readings from Last 3 Encounters:  02/05/19 127 lb (57.6 kg)  01/23/19 127 lb 13.9 oz (58 kg)  01/17/18 129 lb (58.5 kg)      Studies/Labs Reviewed:   EKG:  EKG is ordered today.  The ekg ordered today demonstrates sinus bradycardia at rate of 49 bpm  Recent Labs: 01/23/2019: ALT 11 01/24/2019: Magnesium 2.2 01/25/2019: B Natriuretic Peptide 2,451.0 01/27/2019: Hemoglobin 10.2; Platelets 208; TSH 0.325 01/28/2019: BUN 27; Creatinine, Ser 0.85; Potassium 3.6; Sodium 136   Lipid Panel No results found for: CHOL, TRIG, HDL, CHOLHDL, VLDL, LDLCALC, LDLDIRECT  Additional studies/ records that were reviewed today include:   As summarized above    ASSESSMENT & PLAN:    1. PAF - CHADSVASCs score of 6. Start anticoagulation when okay with GI.  Echo with preserved LV function and intermediate diastolic dysfunction. Abnormal TSH.  Heart rate is slow.  Reduce metoprolol to 12.5 mg twice daily.  Continue amiodarone 200 mg daily.  2. Abnormal thyroid function -  TSH is mildly decreased and free T4 mildly increased.  -Has follow-up with PCP tomorrow.  3.  Hypertension -Blood pressure stable.  Reduce metoprolol as above.  Continue valsartan/hydrochlorothiazide at current dose.   Medication Adjustments/Labs and Tests Ordered: Current medicines are reviewed at length with the patient today.   Concerns regarding medicines are outlined above.  Medication changes, Labs and Tests ordered today are listed in the Patient Instructions below. Patient Instructions  Medication Instructions:  DECREASE: Metoprolol to 12.5 mg twice a day   *If you need a refill on your cardiac medications before your next appointment, please call your pharmacy*  Lab Work: None   If you have labs (blood work) drawn today and your tests are completely normal, you will receive your results only by: Marland Kitchen MyChart Message (if you have MyChart) OR . A paper copy in the mail If you have any lab test that is abnormal or we need to change your treatment, we will call you to review the results.  Testing/Procedures: None   Follow-Up: At [facility], you and your health needs are our priority.  As part of our continuing mission to provide you with exceptional heart care, we have created designated Provider Care Teams.  These Care Teams include your primary Cardiologist (physician) and Advanced Practice Providers (APPs -  Physician Assistants and Nurse Practitioners) who all work together to provide you with the care you need, when you need it.  Your next appointment:   3 month(s)  The format for your next appointment:   Virtual Visit   Provider:   You may see Jenkins Rouge, MD or one of the following Advanced Practice Providers on your designated Care Team:    Truitt Merle, NP  Cecilie Kicks, NP  Kathyrn Drown, NP   Other Instructions      Signed, Leanor Kail, [place]  02/05/2019 11:29 AM    [address], [place], [place]  [address] Phone: [phone]; Fax: [phone]

## 2019-02-05 ENCOUNTER — Encounter: Payer: Self-pay | Admitting: Physician Assistant

## 2019-02-05 ENCOUNTER — Other Ambulatory Visit: Payer: Self-pay

## 2019-02-05 ENCOUNTER — Ambulatory Visit (INDEPENDENT_AMBULATORY_CARE_PROVIDER_SITE_OTHER): Payer: Medicare Other | Admitting: Physician Assistant

## 2019-02-05 VITALS — BP 116/60 | HR 50 | Ht 62.0 in | Wt 127.0 lb

## 2019-02-05 DIAGNOSIS — I480 Paroxysmal atrial fibrillation: Secondary | ICD-10-CM

## 2019-02-05 DIAGNOSIS — R7989 Other specified abnormal findings of blood chemistry: Secondary | ICD-10-CM | POA: Diagnosis not present

## 2019-02-05 DIAGNOSIS — I10 Essential (primary) hypertension: Secondary | ICD-10-CM | POA: Diagnosis not present

## 2019-02-05 MED ORDER — METOPROLOL TARTRATE 25 MG PO TABS: 12.5000 mg | ORAL_TABLET | Freq: Two times a day (BID) | ORAL | 3 refills | Status: DC

## 2019-02-05 NOTE — Patient Instructions (Signed)
Medication Instructions:  DECREASE: Metoprolol to 12.5 mg twice a day   *If you need a refill on your cardiac medications before your next appointment, please call your pharmacy*  Lab Work: None   If you have labs (blood work) drawn today and your tests are completely normal, you will receive your results only by: Marland Kitchen MyChart Message (if you have MyChart) OR . A paper copy in the mail If you have any lab test that is abnormal or we need to change your treatment, we will call you to review the results.  Testing/Procedures: None   Follow-Up: At [facility], you and your health needs are our priority.  As part of our continuing mission to provide you with exceptional heart care, we have created designated Provider Care Teams.  These Care Teams include your primary Cardiologist (physician) and Advanced Practice Providers (APPs -  Physician Assistants and Nurse Practitioners) who all work together to provide you with the care you need, when you need it.  Your next appointment:   3 month(s)  The format for your next appointment:   Virtual Visit   Provider:   You may see Jenkins Rouge, MD or one of the following Advanced Practice Providers on your designated Care Team:    Truitt Merle, NP  Cecilie Kicks, NP  Kathyrn Drown, NP   Other Instructions

## 2019-02-06 ENCOUNTER — Encounter: Payer: Self-pay | Admitting: Internal Medicine

## 2019-02-06 ENCOUNTER — Ambulatory Visit (INDEPENDENT_AMBULATORY_CARE_PROVIDER_SITE_OTHER): Payer: Medicare Other | Admitting: Internal Medicine

## 2019-02-06 ENCOUNTER — Telehealth: Payer: Self-pay | Admitting: Internal Medicine

## 2019-02-06 DIAGNOSIS — R946 Abnormal results of thyroid function studies: Secondary | ICD-10-CM | POA: Diagnosis not present

## 2019-02-06 DIAGNOSIS — J410 Simple chronic bronchitis: Secondary | ICD-10-CM

## 2019-02-06 DIAGNOSIS — I4891 Unspecified atrial fibrillation: Secondary | ICD-10-CM | POA: Diagnosis not present

## 2019-02-06 DIAGNOSIS — K254 Chronic or unspecified gastric ulcer with hemorrhage: Secondary | ICD-10-CM | POA: Diagnosis not present

## 2019-02-06 DIAGNOSIS — J189 Pneumonia, unspecified organism: Secondary | ICD-10-CM | POA: Diagnosis not present

## 2019-02-06 NOTE — Progress Notes (Signed)
Brittany Oliver    [id number]    Aug 08, 1940  Primary Care Physician:White, Caren Griffins, MD Date of Appointment: 02/06/2019 Established Patient Visit  I connected with  Brittany Oliver on 02/06/19 by a video enabled telemedicine application and verified that I am speaking with the correct person using two identifiers.   I discussed the limitations of evaluation and management by telemedicine. The patient expressed understanding and agreed to proceed.  Chief complaint:   Chief Complaint  Patient presents with  . Hospitalization Follow-up    feeling so much better after hospitalization    HPI: Previously seen by Dr. Teressa Lower.  Establishing care with me.  I saw her inpatient when she was hypoxemic with atrial fibrillation with RVR and acute pulmonary edema.  Interval Updates:  Trelegy.inhaler once daily.  No coughing or wheezing.  She is independent with ADLs.  Was just cleared to drive by her PCP today.  Saw cardiology and it appears she is back in sinus rhythm.  I have reviewed the patient's family social and past medical history and updated as appropriate.   Past Medical History:  Diagnosis Date  . Arthritis   . Cancer ([place]) 02/2013   skin melanoma  . Heart murmur   . Hyperlipidemia   . Hypertension   . Neuralgia facialis vera   . Stroke [facility]) 1987   has a little balance issue since stroke    Past Surgical History:  Procedure Laterality Date  . ABDOMINAL HYSTERECTOMY  1974   pt still has her uterus, took only 1 ovary and 1 tube  . APPENDECTOMY    . COLONOSCOPY    . DILATION AND CURETTAGE OF UTERUS  1965  . ESOPHAGOGASTRODUODENOSCOPY (EGD) WITH PROPOFOL Left 01/23/2019   Procedure: ESOPHAGOGASTRODUODENOSCOPY (EGD) WITH PROPOFOL;  Surgeon: Arta Silence, MD;  [facility];  Service: Endoscopy;  Laterality: Left;  . EXCISION MELANOMA WITH SENTINEL LYMPH NODE BIOPSY Left 03/27/2013   Procedure: EXCISION MELANOMA LEFT POSTERIOR ARM WITH LEFT  AXILLARY SENTINEL LYMPH NODE BIOPSY;  Surgeon: Zenovia Jarred, MD;  Location: [place];  Service: General;  Laterality: Left;  . EYE SURGERY Bilateral 2006   cataract and laser surgery  . FINGER SURGERY Right    middle finger  . MASS EXCISION Left 09/15/2013   Procedure: EXCISION MASS LEFT UPPER ARM;  Surgeon: Zenovia Jarred, MD;  Location: [place];  Service: General;  Laterality: Left;  . TONSILLECTOMY      Family History  Adopted: Yes  Problem Relation Age of Onset  . Ataxia Neg Hx   . Chorea Neg Hx   . Dementia Neg Hx   . Mental retardation Neg Hx   . Migraines Neg Hx   . Multiple sclerosis Neg Hx   . Neurofibromatosis Neg Hx   . Neuropathy Neg Hx   . Parkinsonism Neg Hx   . Seizures Neg Hx   . Stroke Neg Hx     Social History   Occupational History  . Not on file  Tobacco Use  . Smoking status: Former Smoker    Packs/day: 1.00    Years: 38.00    Pack years: 38.00    Types: Cigarettes    Start date: 38    Quit date: 03/23/2005    Years since quitting: 13.8  . Smokeless tobacco: Never Used  Substance and Sexual Activity  . Alcohol use: Yes    Alcohol/week: 5.0 standard drinks    Types: 5 Glasses  of wine per week  . Drug use: No  . Sexual activity: Yes    Partners: Male    Review of systems: Constitutional: No fevers, chills, night sweats, or weight loss. CV: No chest pain, or palpitations. Resp: No hemoptysis.  Physical Exam: There were no vitals taken for this visit.  Due to the nature of visit.  Unable to perform due to nature of visit  Data Reviewed: Imaging: I have personally reviewed the   PFTs:  PFT Results Latest Ref Rng & Units 01/20/2015  FVC-Pre L 2.27  FVC-Predicted Pre % 82  FVC-Post L 2.24  FVC-Predicted Post % 81  Pre FEV1/FVC % % 50  Post FEV1/FCV % % 57  FEV1-Pre L 1.13  FEV1-Predicted Pre % 54  FEV1-Post L 1.27  DLCO UNC% % 84  DLCO COR %Predicted % 108  TLC L 4.90  TLC % Predicted % 99  RV % Predicted % 121   I have  personally reviewed the patient's PFTs and which demonstrate moderately severe airflow limitation with borderline bronchodilator response.  Labs:  Immunization status: Immunization History  Administered Date(s) Administered  . Influenza Split 11/30/2008  . Influenza-Unspecified 12/16/2014, 02/15/2017, 11/19/2018  . Pneumococcal Conjugate-13 05/27/2013  . Pneumococcal Polysaccharide-23 07/11/2007  . Tdap 05/07/2013   up to date and documented.up to date and documented.  Assessment:  Moderately severe COPD, well controlled  Plan/Recommendations: Continue Trelegy inhaler, with as needed albuterol She is up-to-date on her flu shot. I will see her in 6 months.   Return to Care: Return in about 6 months (around 08/06/2019).   Lenice Llamas, MD Pulmonary and [place]

## 2019-02-06 NOTE — Telephone Encounter (Signed)
Spoke with patient, she gave met he medication she is allergic to.  Nothing further needed at this time.

## 2019-02-09 NOTE — Addendum Note (Signed)
Addended by: Mendel Ryder on: 02/09/2019 08:15 AM   Modules accepted: Orders

## 2019-02-10 ENCOUNTER — Telehealth: Payer: Self-pay | Admitting: *Deleted

## 2019-02-10 NOTE — Telephone Encounter (Signed)
[employer] to ship a 14 day cardiac event monitor to the patients home. Instructions reviewed briefly as the are included in the monitor kit. 

## 2019-02-11 MED ORDER — FUROSEMIDE 20 MG PO TABS: 20.0000 mg | ORAL_TABLET | Freq: Every day | ORAL | 3 refills | Status: DC | PRN

## 2019-02-11 NOTE — Telephone Encounter (Signed)
-----   Message from [place], [place] sent at 02/11/2019 11:17 AM EST ----- At discharge, Dr. Stanford Breed recommended to take Lasix as needed. Did not shaw BP reading yet>> I am off until Monday, please review with DOD or PCP.

## 2019-02-11 NOTE — Telephone Encounter (Signed)
Called pt re: bp readings. Per Robbie Lis, PA-C, pt has been advised to cut the Lasix back to only as needed and to monitor her bp readings through the weekend and send a message via mychart on Monday. Pt aware that we have sent bp readings to [facility], PA-C, which is off today, and will be addressed upon his return. Pt advised to call the office through the weekend if needed.  Pt very grateful for the call.

## 2019-02-16 NOTE — Telephone Encounter (Signed)
Called patient about Dr. Kyla Balzarine advisement. Patient will hold diovan/HCTZ and see PA next week. Patient stated she will be getting lab work with PCP that day already. Informed patient to let our office know at her appointment what lab work she got, so we will not repeat anything. Patient verbalized understanding.

## 2019-02-18 ENCOUNTER — Ambulatory Visit (INDEPENDENT_AMBULATORY_CARE_PROVIDER_SITE_OTHER): Payer: Medicare Other

## 2019-02-18 DIAGNOSIS — D485 Neoplasm of uncertain behavior of skin: Secondary | ICD-10-CM | POA: Diagnosis not present

## 2019-02-18 DIAGNOSIS — L814 Other melanin hyperpigmentation: Secondary | ICD-10-CM | POA: Diagnosis not present

## 2019-02-18 DIAGNOSIS — I4891 Unspecified atrial fibrillation: Secondary | ICD-10-CM | POA: Diagnosis not present

## 2019-02-18 DIAGNOSIS — D1801 Hemangioma of skin and subcutaneous tissue: Secondary | ICD-10-CM | POA: Diagnosis not present

## 2019-02-18 DIAGNOSIS — L853 Xerosis cutis: Secondary | ICD-10-CM | POA: Diagnosis not present

## 2019-02-18 DIAGNOSIS — L821 Other seborrheic keratosis: Secondary | ICD-10-CM | POA: Diagnosis not present

## 2019-02-18 DIAGNOSIS — L570 Actinic keratosis: Secondary | ICD-10-CM | POA: Diagnosis not present

## 2019-02-18 DIAGNOSIS — C44619 Basal cell carcinoma of skin of left upper limb, including shoulder: Secondary | ICD-10-CM | POA: Diagnosis not present

## 2019-02-18 DIAGNOSIS — Z85820 Personal history of malignant melanoma of skin: Secondary | ICD-10-CM | POA: Diagnosis not present

## 2019-02-18 DIAGNOSIS — Z85828 Personal history of other malignant neoplasm of skin: Secondary | ICD-10-CM | POA: Diagnosis not present

## 2019-02-24 ENCOUNTER — Ambulatory Visit (HOSPITAL_COMMUNITY): Payer: Medicare Other | Admitting: Physician Assistant

## 2019-03-03 DIAGNOSIS — C44619 Basal cell carcinoma of skin of left upper limb, including shoulder: Secondary | ICD-10-CM | POA: Diagnosis not present

## 2019-03-03 DIAGNOSIS — Z85828 Personal history of other malignant neoplasm of skin: Secondary | ICD-10-CM | POA: Diagnosis not present

## 2019-03-03 DIAGNOSIS — Z85820 Personal history of malignant melanoma of skin: Secondary | ICD-10-CM | POA: Diagnosis not present

## 2019-03-04 ENCOUNTER — Other Ambulatory Visit: Payer: Self-pay | Admitting: Neurology

## 2019-03-04 NOTE — Telephone Encounter (Signed)
Requested Prescriptions   Pending Prescriptions Disp Refills  . gabapentin (NEURONTIN) 300 MG capsule [Pharmacy Med Name: GABAPENTIN CAP 300MG ] 270 capsule 3    Sig: TAKE 1 CAPSULE 3 TIMES A   DAY  . gabapentin (NEURONTIN) 600 MG tablet [Pharmacy Med Name: GABAPENTIN TAB 600MG ] 270 tablet 0    Sig: TAKE 1 TABLET WITH ONE     300MG  TABLET 3 TIMES A DAY   Rx last filled: 04/17/18 #270 3 REFILLS  Pt last seen:01/17/18  Follow up appt scheduled:NONE DENIED PATIENT NEEDS APPT LAST SEEN 1 YEAR AGO

## 2019-03-11 ENCOUNTER — Other Ambulatory Visit: Payer: Self-pay | Admitting: Gastroenterology

## 2019-03-11 ENCOUNTER — Encounter: Payer: Self-pay | Admitting: Neurology

## 2019-03-11 ENCOUNTER — Telehealth: Payer: Self-pay | Admitting: *Deleted

## 2019-03-11 DIAGNOSIS — Z1211 Encounter for screening for malignant neoplasm of colon: Secondary | ICD-10-CM

## 2019-03-11 NOTE — Progress Notes (Addendum)
Virtual Visit via Video Note The purpose of this virtual visit is to provide medical care while limiting exposure to the novel coronavirus.    Consent was obtained for video visit:  Yes.   Answered questions that patient had about telehealth interaction:  Yes.   I discussed the limitations, risks, security and privacy concerns of performing an evaluation and management service by telemedicine. I also discussed with the patient that there may be a patient responsible charge related to this service. The patient expressed understanding and agreed to proceed.  Pt location: Home Physician Location: Office Name of referring provider:  [name], MD I connected with Brittany Oliver at patients initiation/request on 03/12/2019 at  9:10 AM EST by video enabled telemedicine application and verified that I am speaking with the correct person using two identifiers. Pt MRN:  [id number] Pt DOB:  Sep 15, 1940 Video Participants:  Brittany Oliver;   History of Present Illness:  Brittany Oliver is a 78 year old right-handed woman with hypertension, hypercholesterolemia, and history of posterior circulation stroke who follows up for atypical left-sided facial pain.  UPDATE:  Current medication: Gabapentin 900 mg 3 times daily. Facial pain is still controlled.  HISTORY:  She began experiencing left-sided facial pain and 2006.She reports a fairly constant burning and pins and needles sensation in the left V1 and V2 distribution.Light touch, cold and wind can trigger a shooting pain down the face, but there is no spontaneous paroxysmal shooting pain. Eating and brushing her teeth does not aggravate it.She says heat seems to help relieve it.She was eventually diagnosed with left trigeminal neuralgia in 2012.  Past medications:Trileptal (caused hyponatremia) Other therapy:Gamma Knife x2 (ineffective).  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  She does have a history of brainstem stroke in 1987, in which she presented with left oculomotor weakness, left rotatory nystagmus, left facial numbness and dysfunction of her right arm and leg.She still has residual numbness of the left face.  In 2018, she endorsed balance problems and exhibited some symptoms of neuropathy in the feet.  She underwent work-up for neuropathy.  ANA, sed rate, B12, B6, TSH, SPEP and IFE were unremarkable.  She was referred to physical therapy to help with balance.  It was minimally effective but may have not been as proactive in home exercises.  She has to think before turning around too fast.  No associated dizziness.  Past Medical History: Past Medical History:  Diagnosis Date  . Arthritis   . Cancer ([place]) 02/2013   skin melanoma  . Heart murmur   . Hyperlipidemia   . Hypertension   . Neuralgia facialis vera   . Stroke [facility]) 1987   has a little balance issue since stroke    Medications: Outpatient Encounter Medications as of 03/12/2019  Medication Sig Note  . albuterol (VENTOLIN HFA) 108 (90 Base) MCG/ACT inhaler Inhale 2 puffs into the lungs every 6 (six) hours as needed for wheezing or shortness of breath. (Patient not taking: Reported on 02/06/2019)   . amiodarone (PACERONE) 200 MG tablet Take 2 tablets (400 mg total) by mouth 2 (two) times daily for 4 days, THEN 1 tablet (200 mg total) daily.   . Calcium Carbonate-Vitamin D (CALCIUM 600/VITAMIN D) 600-400 MG-UNIT chew tablet Chew 1 tablet by mouth daily.   Marland Kitchen FLECTOR 1.3 % PTCH Place 1 patch onto the skin  as needed.  01/22/2019: Lower back  . Fluticasone-Umeclidin-Vilant (TRELEGY ELLIPTA) 100-62.5-25 MCG/INH AEPB Inhale into the lungs.   . furosemide (LASIX) 20 MG tablet Take 1 tablet (20 mg total) by mouth daily as needed.   . gabapentin (NEURONTIN) 300 MG capsule  Take 1 capsule (300 mg total) by mouth 3 (three) times daily.   Marland Kitchen gabapentin (NEURONTIN) 600 MG tablet Take 600 mg by mouth 3 (three) times daily.   . metoprolol tartrate (LOPRESSOR) 25 MG tablet Take 0.5 tablets (12.5 mg total) by mouth 2 (two) times daily.   . pantoprazole (PROTONIX) 40 MG tablet Take 1 tablet (40 mg total) by mouth 2 (two) times daily before a meal.   . polyvinyl alcohol (LIQUIFILM TEARS) 1.4 % ophthalmic solution Place 1 drop into both eyes as needed for dry eyes.   . pravastatin (PRAVACHOL) 80 MG tablet Take 80 mg by mouth daily.   . vitamin C (ASCORBIC ACID) 500 MG tablet Take 500 mg by mouth daily.    No facility-administered encounter medications on file as of 03/12/2019.    Allergies: Allergies  Allergen Reactions  . Trileptal [Oxcarbazepine] Other (See Comments)    Low potassium    Family History: Family History  Adopted: Yes  Problem Relation Age of Onset  . Ataxia Neg Hx   . Chorea Neg Hx   . Dementia Neg Hx   . Mental retardation Neg Hx   . Migraines Neg Hx   . Multiple sclerosis Neg Hx   . Neurofibromatosis Neg Hx   . Neuropathy Neg Hx   . Parkinsonism Neg Hx   . Seizures Neg Hx   . Stroke Neg Hx     Social History: Social History   Socioeconomic History  . Marital status: Married    Spouse name: Not on file  . Number of children: Not on file  . Years of education: Not on file  . Highest education level: Not on file  Occupational History  . Not on file  Tobacco Use  . Smoking status: Former Smoker    Packs/day: 1.00    Years: 38.00    Pack years: 38.00    Types: Cigarettes    Start date: 54    Quit date: 03/23/2005    Years since quitting: 13.9  . Smokeless tobacco: Never Used  Substance and Sexual Activity  . Alcohol use: Yes    Alcohol/week: 5.0 standard drinks    Types: 5 Glasses of wine per week  . Drug use: No  . Sexual activity: Yes    Partners: Male  Other Topics Concern  . Not on file  Social History Narrative  .  Not on file   Social Determinants of Health   Financial Resource Strain:   . Difficulty of Paying Living Expenses: Not on file  Food Insecurity:   . Worried About Charity fundraiser in the Last Year: Not on file  . Ran Out of Food in the Last Year: Not on file  Transportation Needs:   . Lack of Transportation (Medical): Not on file  . Lack of Transportation (Non-Medical): Not on file  Physical Activity:   . Days of Exercise per Week: Not on file  . Minutes of Exercise per Session: Not on file  Stress:   . Feeling of Stress : Not on file  Social Connections:   . Frequency of Communication with Friends and Family: Not on file  . Frequency of Social Gatherings with Friends and Family: Not on file  .  Attends Religious Services: Not on file  . Active Member of Clubs or Organizations: Not on file  . Attends Archivist Meetings: Not on file  . Marital Status: Not on file  Intimate Partner Violence:   . Fear of Current or Ex-Partner: Not on file  . Emotionally Abused: Not on file  . Physically Abused: Not on file  . Sexually Abused: Not on file    Observations/Objective:   Height 5\' 2"  (1.575 m), weight 128 lb (58.1 kg). No acute distress.  Alert and oriented.  Speech fluent and not dysarthric.  Language intact.  Eyes orthophoric on primary gaze.  Face symmetric.  Assessment and Plan:   1.  Atypical left-sided facial pain, stable 2.  Unsteady feet.  Neurologic exam is not convincing for intracranial abnormality or myelopathy.  She does not exhibit symptoms of Parkinson's disease.  Reduced sensation appreciated in the feet, suggesting possible underlying neuropathy.  At this point, we will defer further testing such as NCV-EMG as it would not change management (we already checked labs for common causes of neuropathy).  1.  Gabapentin 900mg  three times daily.  She requests a prescription for 600 mg tablet that she would like to take one 300 mg tablet with 600 mg tablet 3 times  a day to minimize the number of pills. 2.  Follow-up in 1 year.  Follow Up Instructions:    -I discussed the assessment and treatment plan with the patient. The patient was provided an opportunity to ask questions and all were answered. The patient agreed with the plan and demonstrated an understanding of the instructions.   The patient was advised to call back or seek an in-person evaluation if the symptoms worsen or if the condition fails to improve as anticipated.    Total Time spent in visit with the patient was:  15 minutes    Dudley Major, DO

## 2019-03-11 NOTE — Telephone Encounter (Signed)
-----   Message from Josue Hector, MD sent at 03/10/2019  2:56 PM EST ----- No arrhythmia

## 2019-03-11 NOTE — Telephone Encounter (Signed)
Pt has been notified of her monitor by phone with verbal understanding. Pt states she saw results on MY CHART as well. Pt is very upset that she only saw Dr. Johnsie Cancel she says for 5 minutes while she was in the hospital. She is upset about having to see the A-fib clinic as well as she states she does not really want to take the Amiodarone and would like to come off it. I explained in great detail with her about a-fib and medications used to help control a-fib. I explained to her the longer and more times you are in a-fib the higher risk of stroke a person is at. Pt states she will keep the appt in 03/2019 with A-fib clinic and said she will decide after that if she will need to continue with them. She is upset that she has not seen Dr. Johnsie Cancel in a while and wants to see him. I saw in 01/2019 ov note pt to follow up in 3 months. I have scheduled  him to see Dr. Johnsie Cancel 05/19/19. Pt said she is sorry for giving me a hard time. I told her I understand her frustration. Pt thanked me for the call and the help I have give her today. The patient has been notified of the result and verbalized understanding.  All questions (if any) were answered. Julaine Hua, [facility] 03/11/2019 4:31 PM

## 2019-03-12 ENCOUNTER — Encounter: Payer: Self-pay | Admitting: Neurology

## 2019-03-12 ENCOUNTER — Telehealth (INDEPENDENT_AMBULATORY_CARE_PROVIDER_SITE_OTHER): Payer: Medicare Other | Admitting: Neurology

## 2019-03-12 ENCOUNTER — Other Ambulatory Visit: Payer: Self-pay

## 2019-03-12 VITALS — Ht 62.0 in | Wt 128.0 lb

## 2019-03-12 DIAGNOSIS — R2681 Unsteadiness on feet: Secondary | ICD-10-CM

## 2019-03-12 DIAGNOSIS — G501 Atypical facial pain: Secondary | ICD-10-CM | POA: Diagnosis not present

## 2019-03-12 MED ORDER — GABAPENTIN 600 MG PO TABS: 600.0000 mg | ORAL_TABLET | Freq: Three times a day (TID) | ORAL | 3 refills | Status: DC

## 2019-03-12 MED ORDER — GABAPENTIN 300 MG PO CAPS: 300.0000 mg | ORAL_CAPSULE | Freq: Three times a day (TID) | ORAL | 3 refills | Status: DC

## 2019-03-24 ENCOUNTER — Other Ambulatory Visit: Payer: Self-pay

## 2019-03-24 MED ORDER — GABAPENTIN 600 MG PO TABS: 600.0000 mg | ORAL_TABLET | Freq: Three times a day (TID) | ORAL | 3 refills | Status: DC

## 2019-03-26 ENCOUNTER — Other Ambulatory Visit: Payer: Self-pay

## 2019-03-26 ENCOUNTER — Encounter (HOSPITAL_COMMUNITY): Payer: Self-pay | Admitting: Physician Assistant

## 2019-03-26 ENCOUNTER — Ambulatory Visit (HOSPITAL_COMMUNITY)
Admission: RE | Admit: 2019-03-26 | Discharge: 2019-03-26 | Disposition: A | Discharge status: Home / Self Care | Payer: Medicare Other | Source: Ambulatory Visit | Attending: Physician Assistant | Admitting: Physician Assistant

## 2019-03-26 VITALS — BP 150/66 | HR 48 | Ht 62.0 in | Wt 125.4 lb

## 2019-03-26 DIAGNOSIS — E785 Hyperlipidemia, unspecified: Secondary | ICD-10-CM | POA: Insufficient documentation

## 2019-03-26 DIAGNOSIS — Z888 Allergy status to other drugs, medicaments and biological substances status: Secondary | ICD-10-CM | POA: Insufficient documentation

## 2019-03-26 DIAGNOSIS — Z85820 Personal history of malignant melanoma of skin: Secondary | ICD-10-CM | POA: Insufficient documentation

## 2019-03-26 DIAGNOSIS — I10 Essential (primary) hypertension: Secondary | ICD-10-CM | POA: Diagnosis not present

## 2019-03-26 DIAGNOSIS — Z8673 Personal history of transient ischemic attack (TIA), and cerebral infarction without residual deficits: Secondary | ICD-10-CM | POA: Insufficient documentation

## 2019-03-26 DIAGNOSIS — D6869 Other thrombophilia: Secondary | ICD-10-CM | POA: Diagnosis not present

## 2019-03-26 DIAGNOSIS — Z79899 Other long term (current) drug therapy: Secondary | ICD-10-CM | POA: Insufficient documentation

## 2019-03-26 DIAGNOSIS — R001 Bradycardia, unspecified: Secondary | ICD-10-CM | POA: Insufficient documentation

## 2019-03-26 DIAGNOSIS — Z87891 Personal history of nicotine dependence: Secondary | ICD-10-CM | POA: Insufficient documentation

## 2019-03-26 DIAGNOSIS — I480 Paroxysmal atrial fibrillation: Secondary | ICD-10-CM | POA: Diagnosis not present

## 2019-03-26 LAB — BASIC METABOLIC PANEL
Anion gap: 8 (ref 5–15)
BUN: 17 mg/dL (ref 8–23)
CO2: 28 mmol/L (ref 22–32)
Calcium: 9.4 mg/dL (ref 8.9–10.3)
Chloride: 103 mmol/L (ref 98–111)
Creatinine, Ser: 0.91 mg/dL (ref 0.44–1.00)
GFR calc Af Amer: >60 mL/min (ref >60)
GFR calc non Af Amer: >60 mL/min (ref >60)
Glucose, Bld: 99 mg/dL (ref 70–99)
Potassium: 4.1 mmol/L (ref 3.5–5.1)
Sodium: 139 mmol/L (ref 135–145)

## 2019-03-26 MED ORDER — AMIODARONE HCL 200 MG PO TABS: 100.0000 mg | ORAL_TABLET | Freq: Every day | ORAL | Status: DC

## 2019-03-26 NOTE — Progress Notes (Signed)
Primary Care Physician: [name], MD Primary Cardiologist: Dr Johnsie Cancel Primary Electrophysiologist: none Referring Physician: Dr Renold Don is a 79 y.o. female with a history of CVA in 1987, HLD, HTN who was admitted 01/22/2019 with N&V, diarrhea and GIB, complicated by afib RVR on 01/24/2019 who presents for consultation in the [facility]. She was initially started on Flecainide on 11/7 but continued to haveintermittent afib. She wasswitched to amiodarone on 11/9 andconverted to sinus rhythm. She can tell when converts.  Echo with preserved LV function and intermediate diastolic dysfunction. Abnormal baseline TSH noted. She has a CHADSVASCs score of 6 but anticoagulation was deferred pending GI approval after GI bleed. Patient wore a 2 week event monitor which showed no afib. She reports that her heart rate has been in the 50s-low 60s and her blood pressure has been 120s/70s. She denies snoring and has cut alcohol out of her diet.   Today, she denies symptoms of palpitations, chest pain, shortness of breath, orthopnea, PND, lower extremity edema, dizziness, presyncope, syncope, snoring, daytime somnolence, bleeding, or neurologic sequela. The patient is tolerating medications without difficulties and is otherwise without complaint today.    Atrial Fibrillation Risk Factors:  she does not have symptoms or diagnosis of sleep apnea. she does not have a history of rheumatic fever. she does have a history of alcohol use. The patient does have a history of early familial atrial fibrillation or other arrhythmias. Brother has afib.  she has a BMI of Body mass index is 22.94 kg/m.Marland Kitchen Filed Weights   03/26/19 1059  Weight: 56.9 kg    Family History  Adopted: Yes  Problem Relation Age of Onset  . Ataxia Neg Hx   . Chorea Neg Hx   . Dementia Neg Hx   . Mental retardation Neg Hx   . Migraines Neg Hx   . Multiple sclerosis Neg Hx   .  Neurofibromatosis Neg Hx   . Neuropathy Neg Hx   . Parkinsonism Neg Hx   . Seizures Neg Hx   . Stroke Neg Hx      Atrial Fibrillation Management history:  Previous antiarrhythmic drugs: flecainide, amiodarone Previous cardioversions: none Previous ablations: none CHADS2VASC score: 6 Anticoagulation history: none   Past Medical History:  Diagnosis Date  . Arthritis   . Cancer ([place]) 02/2013   skin melanoma  . Heart murmur   . Hyperlipidemia   . Hypertension   . Neuralgia facialis vera   . Stroke [facility]) 1987   has a little balance issue since stroke   Past Surgical History:  Procedure Laterality Date  . ABDOMINAL HYSTERECTOMY  1974   pt still has her uterus, took only 1 ovary and 1 tube  . APPENDECTOMY    . COLONOSCOPY    . DILATION AND CURETTAGE OF UTERUS  1965  . ESOPHAGOGASTRODUODENOSCOPY (EGD) WITH PROPOFOL Left 01/23/2019   Procedure: ESOPHAGOGASTRODUODENOSCOPY (EGD) WITH PROPOFOL;  Surgeon: Arta Silence, MD;  Location: [facility];  Service: Endoscopy;  Laterality: Left;  . EXCISION MELANOMA WITH SENTINEL LYMPH NODE BIOPSY Left 03/27/2013   Procedure: EXCISION MELANOMA LEFT POSTERIOR ARM WITH LEFT AXILLARY SENTINEL LYMPH NODE BIOPSY;  Surgeon: Zenovia Jarred, MD;  Location: [facility];  Service: General;  Laterality: Left;  . EYE SURGERY Bilateral 2006   cataract and laser surgery  . FINGER SURGERY Right    middle finger  . MASS EXCISION Left 09/15/2013   Procedure: EXCISION MASS LEFT UPPER ARM;  Surgeon:  Zenovia Jarred, MD;  Location: [place];  Service: General;  Laterality: Left;  . TONSILLECTOMY      Current Outpatient Medications  Medication Sig Dispense Refill  . albuterol (VENTOLIN HFA) 108 (90 Base) MCG/ACT inhaler Inhale 2 puffs into the lungs every 6 (six) hours as needed for wheezing or shortness of breath. 6.7 g 1  . amiodarone (PACERONE) 200 MG tablet Take 0.5 tablets (100 mg total) by mouth daily.    . Calcium Carbonate-Vitamin D (CALCIUM 600/VITAMIN  D) 600-400 MG-UNIT chew tablet Chew 1 tablet by mouth daily.    Marland Kitchen FLECTOR 1.3 % PTCH Place 1 patch onto the skin as needed.     . Fluticasone-Umeclidin-Vilant (TRELEGY ELLIPTA) 100-62.5-25 MCG/INH AEPB Inhale into the lungs.    . furosemide (LASIX) 20 MG tablet Take 1 tablet (20 mg total) by mouth daily as needed. 90 tablet 3  . gabapentin (NEURONTIN) 300 MG capsule Take 1 capsule (300 mg total) by mouth 3 (three) times daily. 270 capsule 3  . gabapentin (NEURONTIN) 600 MG tablet Take 1 tablet (600 mg total) by mouth 3 (three) times daily. 270 tablet 3  . metoprolol tartrate (LOPRESSOR) 25 MG tablet Take 0.5 tablets (12.5 mg total) by mouth 2 (two) times daily. 90 tablet 3  . pantoprazole (PROTONIX) 40 MG tablet Take 1 tablet (40 mg total) by mouth 2 (two) times daily before a meal. 60 tablet 1  . polyvinyl alcohol (LIQUIFILM TEARS) 1.4 % ophthalmic solution Place 1 drop into both eyes as needed for dry eyes.    . pravastatin (PRAVACHOL) 80 MG tablet Take 80 mg by mouth daily.    . vitamin C (ASCORBIC ACID) 500 MG tablet Take 500 mg by mouth daily.     No current facility-administered medications for this encounter.    Allergies  Allergen Reactions  . Trileptal [Oxcarbazepine] Other (See Comments)    Low potassium    Social History   Socioeconomic History  . Marital status: Married    Spouse name: Not on file  . Number of children: Not on file  . Years of education: Not on file  . Highest education level: Not on file  Occupational History  . Not on file  Tobacco Use  . Smoking status: Former Smoker    Packs/day: 1.00    Years: 38.00    Pack years: 38.00    Types: Cigarettes    Start date: 83    Quit date: 03/23/2005    Years since quitting: 14.0  . Smokeless tobacco: Never Used  Substance and Sexual Activity  . Alcohol use: Not Currently    Alcohol/week: 5.0 standard drinks    Types: 5 Glasses of wine per week  . Drug use: No  . Sexual activity: Yes    Partners: Male   Other Topics Concern  . Not on file  Social History Narrative   Right handed   One story home   Drinks caffeine daily 2 cups    Social Determinants of Health   Financial Resource Strain:   . Difficulty of Paying Living Expenses: Not on file  Food Insecurity:   . Worried About Charity fundraiser in the Last Year: Not on file  . Ran Out of Food in the Last Year: Not on file  Transportation Needs:   . Lack of Transportation (Medical): Not on file  . Lack of Transportation (Non-Medical): Not on file  Physical Activity:   . Days of Exercise per Week: Not on file  .  Minutes of Exercise per Session: Not on file  Stress:   . Feeling of Stress : Not on file  Social Connections:   . Frequency of Communication with Friends and Family: Not on file  . Frequency of Social Gatherings with Friends and Family: Not on file  . Attends Religious Services: Not on file  . Active Member of Clubs or Organizations: Not on file  . Attends Archivist Meetings: Not on file  . Marital Status: Not on file  Intimate Partner Violence:   . Fear of Current or Ex-Partner: Not on file  . Emotionally Abused: Not on file  . Physically Abused: Not on file  . Sexually Abused: Not on file     ROS- All systems are reviewed and negative except as per the HPI above.  Physical Exam: Vitals:   03/26/19 1059  BP: (!) 150/66  Pulse: (!) 48  Weight: 56.9 kg  Height: 5\' 2"  (1.575 m)    GEN- The patient is well appearing elderly female, alert and oriented x 3 today.   Head- normocephalic, atraumatic Eyes-  Sclera clear, conjunctiva pink Ears- hearing intact Oropharynx- clear Neck- supple  Lungs- Clear to ausculation bilaterally, normal work of breathing Heart- Regular rhythm, bradycardia, no murmurs, rubs or gallops  GI- soft, NT, ND, + BS Extremities- no clubbing, cyanosis, or edema MS- no significant deformity or atrophy Skin- no rash or lesion Psych- euthymic mood, full affect Neuro-  strength and sensation are intact  Wt Readings from Last 3 Encounters:  03/26/19 56.9 kg  03/11/19 58.1 kg  02/05/19 57.6 kg    EKG today demonstrates SB HR 48, LAD, PR 194, QRS 90, QTc 407  Echo 01/24/19 demonstrated   1. Left ventricular ejection fraction, by visual estimation, is 60 to 65%. The left ventricle has normal function. There is no left ventricular hypertrophy.  2. Left ventricular diastolic parameters are indeterminate.  3. Global right ventricle has normal systolic function.The right ventricular size is normal. No increase in right ventricular wall thickness.  4. Left atrial size was normal.  5. Right atrial size was normal.  6. The mitral valve is normal in structure. Trace mitral valve regurgitation. No evidence of mitral stenosis.  7. The tricuspid valve is normal in structure. Tricuspid valve regurgitation is trivial.  8. The aortic valve is normal in structure. Aortic valve regurgitation is not visualized. No evidence of aortic valve sclerosis or stenosis.  9. The pulmonic valve was grossly normal. Pulmonic valve regurgitation is trivial. 10. Normal pulmonary artery systolic pressure. 11. The inferior vena cava is normal in size with greater than 50% respiratory variability, suggesting right atrial pressure of 3 mmHg. 12. Patient converted to NSR during exam.  Epic records are reviewed at length today  Assessment and Plan:  1. Paroxysmal atrial fibrillation General education about afib provided and questions answered. We also discussed her stroke risk and the risks and benefits of anticoagulation. Patient is agreeable to starting anticoagulation once cleared by GI. She has an appointment for repeat EGD soon. Will decrease amiodarone to 100 mg  Continue metoprolol 12.5 mg BID Check Bmet today.  This patients CHA2DS2-VASc Score and unadjusted Ischemic Stroke Rate (% per year) is equal to 9.7 % stroke rate/year from a score of 6  Above score calculated as 1 point  each if present [CHF, HTN, DM, Vascular=MI/PAD/Aortic Plaque, Age if 65-74, or Female] Above score calculated as 2 points each if present [Age > 75, or Stroke/TIA/TE]   2. HTN  Borderline today, patient's home BP log shows excellent control. No changes today.  3. Bradycardia Patient reports HR in the 50s-60s at home. Asymptomatic. No changes for now.    Follow up with AF clinic in one month. Dr Johnsie Cancel as scheduled.    [facility] [address] [place], [place] [address] [phone] 03/26/2019 12:08 PM

## 2019-03-26 NOTE — Patient Instructions (Signed)
Decrease amiodarone to 100mg once a day  

## 2019-04-01 ENCOUNTER — Other Ambulatory Visit (HOSPITAL_COMMUNITY): Payer: Self-pay | Admitting: *Deleted

## 2019-04-01 MED ORDER — AMIODARONE HCL 200 MG PO TABS: 100.0000 mg | ORAL_TABLET | Freq: Every day | ORAL | 2 refills | Status: DC

## 2019-04-04 ENCOUNTER — Ambulatory Visit: Payer: Medicare Other | Attending: Internal Medicine

## 2019-04-04 DIAGNOSIS — Z23 Encounter for immunization: Secondary | ICD-10-CM | POA: Insufficient documentation

## 2019-04-04 NOTE — Progress Notes (Signed)
   Covid-19 Vaccination Clinic  Name:  Brittany Oliver    MRN: [id number] DOB: 03-01-1941  04/04/2019  [name] was observed post Covid-19 immunization for 15 minutes without incidence. She was provided with Vaccine Information Sheet and instruction to access the V-Safe system.   [name] was instructed to call 911 with any severe reactions post vaccine: Marland Kitchen Difficulty breathing  . Swelling of your face and throat  . A fast heartbeat  . A bad rash all over your body  . Dizziness and weakness    Immunizations Administered    Name Date Dose VIS Date Route   Pfizer COVID-19 Vaccine 04/04/2019  1:36 PM 0.3 mL 02/27/2019 Intramuscular   Manufacturer: [place]   Lot: [id number]   [place]: [id number]

## 2019-04-06 DIAGNOSIS — Z1159 Encounter for screening for other viral diseases: Secondary | ICD-10-CM | POA: Diagnosis not present

## 2019-04-09 DIAGNOSIS — K273 Acute peptic ulcer, site unspecified, without hemorrhage or perforation: Secondary | ICD-10-CM | POA: Diagnosis not present

## 2019-04-09 DIAGNOSIS — K2970 Gastritis, unspecified, without bleeding: Secondary | ICD-10-CM | POA: Diagnosis not present

## 2019-04-16 DIAGNOSIS — M546 Pain in thoracic spine: Secondary | ICD-10-CM | POA: Diagnosis not present

## 2019-04-16 DIAGNOSIS — M545 Low back pain: Secondary | ICD-10-CM | POA: Diagnosis not present

## 2019-04-16 DIAGNOSIS — M5137 Other intervertebral disc degeneration, lumbosacral region: Secondary | ICD-10-CM | POA: Diagnosis not present

## 2019-04-24 ENCOUNTER — Ambulatory Visit: Payer: Medicare Other | Attending: Internal Medicine

## 2019-04-24 DIAGNOSIS — Z23 Encounter for immunization: Secondary | ICD-10-CM

## 2019-04-24 NOTE — Progress Notes (Signed)
   Covid-19 Vaccination Clinic  Name:  Brittany Oliver    MRN: [id number] DOB: [birth year]  04/24/2019  Brittany Oliver was observed post Covid-19 immunization for 15 minutes without incidence. She was provided with Vaccine Information Sheet and instruction to access the V-Safe system.   Brittany Oliver was instructed to call 911 with any severe reactions post vaccine: Marland Kitchen Difficulty breathing  . Swelling of your face and throat  . A fast heartbeat  . A bad rash all over your body  . Dizziness and weakness    Immunizations Administered    Name Date Dose VIS Date Route   Pfizer COVID-19 Vaccine 04/24/2019  8:36 AM 0.3 mL 02/27/2019 Intramuscular   Manufacturer: [place]   Lot: [id number]   [place]: [id number]

## 2019-04-27 ENCOUNTER — Encounter (HOSPITAL_COMMUNITY): Payer: Self-pay | Admitting: Physician Assistant

## 2019-04-27 ENCOUNTER — Other Ambulatory Visit: Payer: Self-pay

## 2019-04-27 ENCOUNTER — Ambulatory Visit (HOSPITAL_COMMUNITY)
Admission: RE | Admit: 2019-04-27 | Discharge: 2019-04-27 | Disposition: A | Discharge status: Home / Self Care | Payer: Medicare Other | Source: Ambulatory Visit | Attending: Physician Assistant | Admitting: Physician Assistant

## 2019-04-27 VITALS — BP 160/62 | HR 49 | Ht 62.0 in | Wt 126.6 lb

## 2019-04-27 DIAGNOSIS — I69398 Other sequelae of cerebral infarction: Secondary | ICD-10-CM | POA: Insufficient documentation

## 2019-04-27 DIAGNOSIS — M1990 Unspecified osteoarthritis, unspecified site: Secondary | ICD-10-CM | POA: Insufficient documentation

## 2019-04-27 DIAGNOSIS — I10 Essential (primary) hypertension: Secondary | ICD-10-CM | POA: Diagnosis not present

## 2019-04-27 DIAGNOSIS — Z87891 Personal history of nicotine dependence: Secondary | ICD-10-CM | POA: Diagnosis not present

## 2019-04-27 DIAGNOSIS — I4891 Unspecified atrial fibrillation: Secondary | ICD-10-CM | POA: Diagnosis present

## 2019-04-27 DIAGNOSIS — E785 Hyperlipidemia, unspecified: Secondary | ICD-10-CM | POA: Insufficient documentation

## 2019-04-27 DIAGNOSIS — Z888 Allergy status to other drugs, medicaments and biological substances status: Secondary | ICD-10-CM | POA: Diagnosis not present

## 2019-04-27 DIAGNOSIS — D6869 Other thrombophilia: Secondary | ICD-10-CM

## 2019-04-27 DIAGNOSIS — R001 Bradycardia, unspecified: Secondary | ICD-10-CM | POA: Diagnosis not present

## 2019-04-27 DIAGNOSIS — Z79899 Other long term (current) drug therapy: Secondary | ICD-10-CM | POA: Insufficient documentation

## 2019-04-27 DIAGNOSIS — I480 Paroxysmal atrial fibrillation: Secondary | ICD-10-CM | POA: Insufficient documentation

## 2019-04-27 DIAGNOSIS — Z85820 Personal history of malignant melanoma of skin: Secondary | ICD-10-CM | POA: Insufficient documentation

## 2019-04-27 NOTE — Progress Notes (Addendum)
Primary Care Physician: [name], MD Primary Cardiologist: Dr Johnsie Cancel Primary Electrophysiologist: none Referring Physician: Dr Renold Don is a 79 y.o. female with a history of CVA in 1987, HLD, HTN who was admitted 01/22/2019 with N&V, diarrhea and GIB, complicated by afib RVR on 01/24/2019 who presents for follow up in the [facility]. She was initially started on Flecainide on 11/7 but continued to haveintermittent afib. She wasswitched to amiodarone on 11/9 andconverted to sinus rhythm. She can tell when converts.  Echo with preserved LV function and intermediate diastolic dysfunction. Abnormal baseline TSH noted. She has a CHADSVASCs score of 6 but anticoagulation was deferred pending GI approval after GI bleed. Patient wore a 2 week event monitor which showed no afib. She reports that her heart rate has been in the 50s-low 60s and her blood pressure has been 120s/70s. She denies snoring and has cut alcohol out of her diet.   On follow up today, patient reports she has done well since her last visit with no heart racing or palpitations. She is tolerating the medication without difficulty. She had a repeat EGD which looked normal per her report.  Today, she denies symptoms of palpitations, chest pain, shortness of breath, orthopnea, PND, lower extremity edema, dizziness, presyncope, syncope, snoring, daytime somnolence, bleeding, or neurologic sequela. The patient is tolerating medications without difficulties and is otherwise without complaint today.    Atrial Fibrillation Risk Factors:  she does not have symptoms or diagnosis of sleep apnea. she does not have a history of rheumatic fever. she does have a history of alcohol use. The patient does have a history of early familial atrial fibrillation or other arrhythmias. Brother has afib.  she has a BMI of Body mass index is 23.16 kg/m.Marland Kitchen Filed Weights   04/27/19 1330  Weight: 57.4  kg    Family History  Adopted: Yes  Problem Relation Age of Onset  . Ataxia Neg Hx   . Chorea Neg Hx   . Dementia Neg Hx   . Mental retardation Neg Hx   . Migraines Neg Hx   . Multiple sclerosis Neg Hx   . Neurofibromatosis Neg Hx   . Neuropathy Neg Hx   . Parkinsonism Neg Hx   . Seizures Neg Hx   . Stroke Neg Hx      Atrial Fibrillation Management history:  Previous antiarrhythmic drugs: flecainide, amiodarone Previous cardioversions: none Previous ablations: none CHADS2VASC score: 6 Anticoagulation history: none   Past Medical History:  Diagnosis Date  . Arthritis   . Cancer ([place]) 02/2013   skin melanoma  . Heart murmur   . Hyperlipidemia   . Hypertension   . Neuralgia facialis vera   . Stroke [facility]) 1987   has a little balance issue since stroke   Past Surgical History:  Procedure Laterality Date  . ABDOMINAL HYSTERECTOMY  1974   pt still has her uterus, took only 1 ovary and 1 tube  . APPENDECTOMY    . COLONOSCOPY    . DILATION AND CURETTAGE OF UTERUS  1965  . ESOPHAGOGASTRODUODENOSCOPY (EGD) WITH PROPOFOL Left 01/23/2019   Procedure: ESOPHAGOGASTRODUODENOSCOPY (EGD) WITH PROPOFOL;  Surgeon: Arta Silence, MD;  [facility];  Service: Endoscopy;  Laterality: Left;  . EXCISION MELANOMA WITH SENTINEL LYMPH NODE BIOPSY Left 03/27/2013   Procedure: EXCISION MELANOMA LEFT POSTERIOR ARM WITH LEFT AXILLARY SENTINEL LYMPH NODE BIOPSY;  Surgeon: Zenovia Jarred, MD;  Location: [place];  Service: General;  Laterality: Left;  . EYE SURGERY Bilateral 2006   cataract and laser surgery  . FINGER SURGERY Right    middle finger  . MASS EXCISION Left 09/15/2013   Procedure: EXCISION MASS LEFT UPPER ARM;  Surgeon: Zenovia Jarred, MD;  Location: [place];  Service: General;  Laterality: Left;  . TONSILLECTOMY      Current Outpatient Medications  Medication Sig Dispense Refill  . albuterol (VENTOLIN HFA) 108 (90 Base) MCG/ACT inhaler Inhale 2 puffs into the lungs  every 6 (six) hours as needed for wheezing or shortness of breath. 6.7 g 1  . amiodarone (PACERONE) 200 MG tablet Take 0.5 tablets (100 mg total) by mouth daily. 30 tablet 2  . Calcium Carbonate-Vitamin D (CALCIUM 600/VITAMIN D) 600-400 MG-UNIT chew tablet Chew 1 tablet by mouth daily.    Marland Kitchen FLECTOR 1.3 % PTCH Place 1 patch onto the skin as needed.     . Fluticasone-Umeclidin-Vilant (TRELEGY ELLIPTA) 100-62.5-25 MCG/INH AEPB Inhale into the lungs.    . furosemide (LASIX) 20 MG tablet Take 1 tablet (20 mg total) by mouth daily as needed. 90 tablet 3  . gabapentin (NEURONTIN) 300 MG capsule Take 1 capsule (300 mg total) by mouth 3 (three) times daily. 270 capsule 3  . gabapentin (NEURONTIN) 600 MG tablet Take 1 tablet (600 mg total) by mouth 3 (three) times daily. 270 tablet 3  . metoprolol tartrate (LOPRESSOR) 25 MG tablet Take 0.5 tablets (12.5 mg total) by mouth 2 (two) times daily. 90 tablet 3  . pantoprazole (PROTONIX) 40 MG tablet Take 1 tablet (40 mg total) by mouth 2 (two) times daily before a meal. 60 tablet 1  . polyvinyl alcohol (LIQUIFILM TEARS) 1.4 % ophthalmic solution Place 1 drop into both eyes as needed for dry eyes.    . pravastatin (PRAVACHOL) 80 MG tablet Take 80 mg by mouth daily.    Marland Kitchen tiZANidine (ZANAFLEX) 4 MG tablet Take 4 mg by mouth every 6 (six) hours.    . traMADol (ULTRAM) 50 MG tablet Take 50 mg by mouth every 6 (six) hours.    . vitamin C (ASCORBIC ACID) 500 MG tablet Take 500 mg by mouth daily.     No current facility-administered medications for this encounter.    Allergies  Allergen Reactions  . Trileptal [Oxcarbazepine] Other (See Comments)    Low potassium    Social History   Socioeconomic History  . Marital status: Married    Spouse name: Not on file  . Number of children: Not on file  . Years of education: Not on file  . Highest education level: Not on file  Occupational History  . Not on file  Tobacco Use  . Smoking status: Former Smoker     Packs/day: 1.00    Years: 38.00    Pack years: 38.00    Types: Cigarettes    Start date: 4    Quit date: 03/23/2005    Years since quitting: 14.1  . Smokeless tobacco: Never Used  Substance and Sexual Activity  . Alcohol use: Not Currently    Alcohol/week: 5.0 standard drinks    Types: 5 Glasses of wine per week  . Drug use: No  . Sexual activity: Yes    Partners: Male  Other Topics Concern  . Not on file  Social History Narrative   Right handed   One story home   Drinks caffeine daily 2 cups    Social Determinants of Health   Financial Resource Strain:   .  Difficulty of Paying Living Expenses: Not on file  Food Insecurity:   . Worried About Charity fundraiser in the Last Year: Not on file  . Ran Out of Food in the Last Year: Not on file  Transportation Needs:   . Lack of Transportation (Medical): Not on file  . Lack of Transportation (Non-Medical): Not on file  Physical Activity:   . Days of Exercise per Week: Not on file  . Minutes of Exercise per Session: Not on file  Stress:   . Feeling of Stress : Not on file  Social Connections:   . Frequency of Communication with Friends and Family: Not on file  . Frequency of Social Gatherings with Friends and Family: Not on file  . Attends Religious Services: Not on file  . Active Member of Clubs or Organizations: Not on file  . Attends Archivist Meetings: Not on file  . Marital Status: Not on file  Intimate Partner Violence:   . Fear of Current or Ex-Partner: Not on file  . Emotionally Abused: Not on file  . Physically Abused: Not on file  . Sexually Abused: Not on file     ROS- All systems are reviewed and negative except as per the HPI above.  Physical Exam: Vitals:   04/27/19 1330  BP: (!) 160/62  Pulse: (!) 49  Weight: 57.4 kg  Height: 5\' 2"  (1.575 m)    GEN- The patient is well appearing elderly female, alert and oriented x 3 today.   HEENT-head normocephalic, atraumatic, sclera clear,  conjunctiva pink, hearing intact, trachea midline. Lungs- Clear to ausculation bilaterally, normal work of breathing Heart- Regular rhythm, bradycardia, no murmurs, rubs or gallops  GI- soft, NT, ND, + BS Extremities- no clubbing, cyanosis, or edema MS- no significant deformity or atrophy Skin- no rash or lesion Psych- euthymic mood, full affect Neuro- strength and sensation are intact   Wt Readings from Last 3 Encounters:  04/27/19 57.4 kg  03/26/19 56.9 kg  03/11/19 58.1 kg    EKG today demonstrates SB HR 49, inc RBBB, LAD, PR 196, QRS 92, QTc 401  Echo 01/24/19 demonstrated   1. Left ventricular ejection fraction, by visual estimation, is 60 to 65%. The left ventricle has normal function. There is no left ventricular hypertrophy.  2. Left ventricular diastolic parameters are indeterminate.  3. Global right ventricle has normal systolic function.The right ventricular size is normal. No increase in right ventricular wall thickness.  4. Left atrial size was normal.  5. Right atrial size was normal.  6. The mitral valve is normal in structure. Trace mitral valve regurgitation. No evidence of mitral stenosis.  7. The tricuspid valve is normal in structure. Tricuspid valve regurgitation is trivial.  8. The aortic valve is normal in structure. Aortic valve regurgitation is not visualized. No evidence of aortic valve sclerosis or stenosis.  9. The pulmonic valve was grossly normal. Pulmonic valve regurgitation is trivial. 10. Normal pulmonary artery systolic pressure. 11. The inferior vena cava is normal in size with greater than 50% respiratory variability, suggesting right atrial pressure of 3 mmHg. 12. Patient converted to NSR during exam.  Epic records are reviewed at length today  Assessment and Plan:  1. Paroxysmal atrial fibrillation Patient appears to be maintaining SR. Patient is agreeable to starting anticoagulation. Will request last visit note from her GI physician Dr  Paulita Fujita. Will plan to start Xarelto 15 mg daily if cleared per Dr Paulita Fujita (CrCl 47). Risks and benefits of  anticoagulation discussed at length today. Continue amiodarone to 100 mg  Continue metoprolol 12.5 mg BID  This patients CHA2DS2-VASc Score and unadjusted Ischemic Stroke Rate (% per year) is equal to 9.7 % stroke rate/year from a score of 6  Above score calculated as 1 point each if present [CHF, HTN, DM, Vascular=MI/PAD/Aortic Plaque, Age if 65-74, or Female] Above score calculated as 2 points each if present [Age > 75, or Stroke/TIA/TE]   2. HTN Patient reports good readings at home. ? White coat. No change today.  3. Bradycardia Asymptomatic, no change.   Follow up with Dr Johnsie Cancel as scheduled. AF clinic in 6 months.  Addendum: Normal EGD 04/09/19 per Dr Paulita Fujita at [facility]. Will plan to start anticoagulation as planned.   [facility] [address] [place], [place] [address] [phone] 04/27/2019 2:07 PM

## 2019-04-29 ENCOUNTER — Other Ambulatory Visit (HOSPITAL_COMMUNITY): Payer: Self-pay | Admitting: *Deleted

## 2019-04-29 MED ORDER — RIVAROXABAN 15 MG PO TABS: 15.0000 mg | ORAL_TABLET | Freq: Every day | ORAL | 3 refills | Status: DC

## 2019-04-29 NOTE — Addendum Note (Signed)
Encounter addended by: Oliver Barre, PA on: 04/29/2019 9:16 AM  Actions taken: Clinical Note Signed

## 2019-05-14 NOTE — Progress Notes (Signed)
Cardiology Office Note    Date:  05/19/2019   ID:  Brittany Oliver, DOB [birth year], MRN [id number]  PCP:  [name], MD  Cardiologist: Dr. Johnsie Cancel   Chief Complaint: Hospital follow up  History of Present Illness:   Brittany Oliver is a 79 y.o. female with hx of CVA in 1987, [place], HTN who was admitted 01/22/2019 with N&V, diarrhea and GIB. Hospital complicated by afib RVR on 01/24/2019. She was initially started on Flecainide on 11/7>> intermittent afib>> switched to amiodarone load on 11/9>> converted to sinus.  She can tell when converts.  Echo with preserved LV function Atrial sizes also normal Abnormal TSH mildly suppressed at 0.325 and T4 mildly elevated at 1.16  CHADSVASCs score of 6. Started on low dose xarelto post d/c Post d/c monitor 12/22 with no further PAF   Seen in afib clinic 1/7/21amiodarone decreased to 100 mg daily Lopressor decreased to 12.5 bid HR 49 during visit in sinus   She was upset about not seeing me and wanted to come off amiodarone   Especially given thyroid issue will stop amiodarone. She was prescribed xarelto but does not want to take It unless she has recurrent episode She has cardia device to use with phone to document    Past Medical History:  Diagnosis Date  . Arthritis   . Cancer ([place]) 02/2013   skin melanoma  . Heart murmur   . Hyperlipidemia   . Hypertension   . Neuralgia facialis vera   . Stroke [facility]) 1987   has a little balance issue since stroke    Past Surgical History:  Procedure Laterality Date  . ABDOMINAL HYSTERECTOMY  1974   pt still has her uterus, took only 1 ovary and 1 tube  . APPENDECTOMY    . COLONOSCOPY    . DILATION AND CURETTAGE OF UTERUS  1965  . ESOPHAGOGASTRODUODENOSCOPY (EGD) WITH PROPOFOL Left 01/23/2019   Procedure: ESOPHAGOGASTRODUODENOSCOPY (EGD) WITH PROPOFOL;  Surgeon: Arta Silence, MD;  [facility];  Service: Endoscopy;  Laterality: Left;  . EXCISION MELANOMA WITH SENTINEL LYMPH  NODE BIOPSY Left 03/27/2013   Procedure: EXCISION MELANOMA LEFT POSTERIOR ARM WITH LEFT AXILLARY SENTINEL LYMPH NODE BIOPSY;  Surgeon: Zenovia Jarred, MD;  Location: [place];  Service: General;  Laterality: Left;  . EYE SURGERY Bilateral 2006   cataract and laser surgery  . FINGER SURGERY Right    middle finger  . MASS EXCISION Left 09/15/2013   Procedure: EXCISION MASS LEFT UPPER ARM;  Surgeon: Zenovia Jarred, MD;  Location: [place];  Service: General;  Laterality: Left;  . TONSILLECTOMY      Current Medications: Prior to Admission medications   Medication Sig Start Date End Date Taking? Authorizing Provider  albuterol (VENTOLIN HFA) 108 (90 Base) MCG/ACT inhaler Inhale 2 puffs into the lungs every 6 (six) hours as needed for wheezing or shortness of breath. 01/28/19   Harold Hedge, MD  amiodarone (PACERONE) 200 MG tablet Take 2 tablets (400 mg total) by mouth 2 (two) times daily for 4 days, THEN 1 tablet (200 mg total) daily. 01/28/19 04/02/19  Harold Hedge, MD  Calcium Carbonate-Vitamin D (CALCIUM 600/VITAMIN D) 600-400 MG-UNIT chew tablet Chew 1 tablet by mouth daily.    [provider]  FLECTOR 1.3 % PTCH Place 1 patch onto the skin daily.  11/23/14   [provider]  Fluticasone-Umeclidin-Vilant (TRELEGY ELLIPTA) 100-62.5-25 MCG/INH AEPB Inhale into the lungs.    [provider]  furosemide (LASIX) 40 MG tablet Take 0.5 tablets (20 mg total) by mouth daily. 01/29/19   Harold Hedge, MD  gabapentin (NEURONTIN) 300 MG capsule Take 1 capsule (300 mg total) by mouth 3 (three) times daily. 04/17/18   Pieter Partridge, DO  metoprolol tartrate (LOPRESSOR) 25 MG tablet Take 1 tablet (25 mg total) by mouth 2 (two) times daily. 01/28/19   Harold Hedge, MD  ondansetron (ZOFRAN) 8 MG tablet Take 8 mg by mouth once.    [provider]  pantoprazole (PROTONIX) 40 MG tablet Take 1 tablet (40 mg total) by mouth 2 (two) times daily before a meal. 01/28/19   Harold Hedge, MD  polyvinyl alcohol (LIQUIFILM TEARS) 1.4 % ophthalmic solution Place 1 drop into both eyes as needed for dry eyes.    [provider]  pravastatin (PRAVACHOL) 80 MG tablet Take 80 mg by mouth daily.    [provider]  valsartan-hydrochlorothiazide (DIOVAN-HCT) 320-12.5 MG tablet Take 1 tablet by mouth daily. 11/22/18   [provider]  vitamin C (ASCORBIC ACID) 500 MG tablet Take 500 mg by mouth daily.    [provider]    Allergies:   Trileptal [oxcarbazepine]   Social History   Socioeconomic History  . Marital status: Married    Spouse name: Not on file  . Number of children: Not on file  . Years of education: Not on file  . Highest education level: Not on file  Occupational History  . Not on file  Tobacco Use  . Smoking status: Former Smoker    Packs/day: 1.00    Years: 38.00    Pack years: 38.00    Types: Cigarettes    Start date: 102    Quit date: 03/23/2005    Years since quitting: 14.1  . Smokeless tobacco: Never Used  Substance and Sexual Activity  . Alcohol use: Not Currently    Alcohol/week: 5.0 standard drinks    Types: 5 Glasses of wine per week  . Drug use: No  . Sexual activity: Yes    Partners: Male  Other Topics Concern  . Not on file  Social History Narrative   Right handed   One story home   Drinks caffeine daily 2 cups    Social Determinants of Health   Financial Resource Strain:   . Difficulty of Paying Living Expenses: Not on file  Food Insecurity:   . Worried About Charity fundraiser in the Last Year: Not on file  . Ran Out of Food in the Last Year: Not on file  Transportation Needs:   . Lack of Transportation (Medical): Not on file  . Lack of Transportation (Non-Medical): Not on file  Physical Activity:   . Days of Exercise per Week: Not on file  . Minutes of Exercise per Session: Not on file  Stress:   . Feeling of Stress : Not on file  Social Connections:   . Frequency of Communication with  Friends and Family: Not on file  . Frequency of Social Gatherings with Friends and Family: Not on file  . Attends Religious Services: Not on file  . Active Member of Clubs or Organizations: Not on file  . Attends Archivist Meetings: Not on file  . Marital Status: Not on file     Family History:  The patient's family history is not on file. She was adopted.   ROS:   Please see the history of present illness.  ROS All other systems reviewed and are negative.   PHYSICAL EXAM:   VS:  BP 118/70   Pulse 60   Ht 5\' 2"  (1.575 m)   Wt 123 lb 1.9 oz (55.8 kg)   SpO2 98%   BMI 22.52 kg/m    GEN: Well nourished, well developed, in no acute distress  HEENT: normal  Neck: no JVD, carotid bruits, or masses Cardiac: RRR; no murmurs, rubs, or gallops,no edema  Respiratory:  clear to auscultation bilaterally, normal work of breathing GI: soft, nontender, nondistended, + BS MS: no deformity or atrophy  Skin: warm and dry, no rash Neuro:  Alert and Oriented x 3, Strength and sensation are intact Psych: euthymic mood, full affect  Wt Readings from Last 3 Encounters:  05/19/19 123 lb 1.9 oz (55.8 kg)  04/27/19 126 lb 9.6 oz (57.4 kg)  03/26/19 125 lb 6.4 oz (56.9 kg)      Studies/Labs Reviewed:   EKG:   SB rate 49 04/27/19   Recent Labs: 01/23/2019: ALT 11 01/24/2019: Magnesium 2.2 01/25/2019: B Natriuretic Peptide 2,451.0 01/27/2019: Hemoglobin 10.2; Platelets 208; TSH 0.325 03/26/2019: BUN 17; Creatinine, Ser 0.91; Potassium 4.1; Sodium 139   Lipid Panel No results found for: CHOL, TRIG, HDL, CHOLHDL, VLDL, LDLCALC, LDLDIRECT  Additional studies/ records that were reviewed today include:   As summarized above    ASSESSMENT & PLAN:    1. PAF - CHADSVASCs score of 6. Continue low dose beta blocker  d/c amiodarone Long discussion about  Anticoagulation Patient prefers to not start Xarelto unless she has another episode. She knows when she Is in it and has a Cardia  phone device to document. She will start taking xarelto and increase lopressor to full tab Bid if she thinks she has gone into it  2. Abnormal thyroid function -  TSH is mildly decreased and free T4 mildly increased. Needs repeat labs off amiodarone will order for 4 weeks   3.  Hypertension -Blood pressure stable.  Reduced metoprolol as above.  Continue valsartan/hydrochlorothiazide at current dose.   Medication Adjustments/Labs and Tests Ordered: Current medicines are reviewed at length with the patient today.  Concerns regarding medicines are outlined above.  Medication changes, Labs and Tests ordered today are listed in the Patient Instructions below. There are no Patient Instructions on file for this visit.   D/c amiodarone TSH/T4 in 4 weeks F/U in 6 months   Signed, Jenkins Rouge, MD  05/19/2019 4:06 PM    [facility] [place], [place], [place]  [address] Phone: [phone]; Fax: [phone]

## 2019-05-15 DIAGNOSIS — D6869 Other thrombophilia: Secondary | ICD-10-CM | POA: Diagnosis not present

## 2019-05-15 DIAGNOSIS — R7301 Impaired fasting glucose: Secondary | ICD-10-CM | POA: Diagnosis not present

## 2019-05-15 DIAGNOSIS — I480 Paroxysmal atrial fibrillation: Secondary | ICD-10-CM | POA: Diagnosis not present

## 2019-05-15 DIAGNOSIS — E441 Mild protein-calorie malnutrition: Secondary | ICD-10-CM | POA: Diagnosis not present

## 2019-05-15 DIAGNOSIS — E785 Hyperlipidemia, unspecified: Secondary | ICD-10-CM | POA: Diagnosis not present

## 2019-05-15 DIAGNOSIS — Z8719 Personal history of other diseases of the digestive system: Secondary | ICD-10-CM | POA: Diagnosis not present

## 2019-05-15 DIAGNOSIS — I10 Essential (primary) hypertension: Secondary | ICD-10-CM | POA: Diagnosis not present

## 2019-05-19 ENCOUNTER — Encounter: Payer: Self-pay | Admitting: Cardiovascular Disease

## 2019-05-19 ENCOUNTER — Ambulatory Visit (INDEPENDENT_AMBULATORY_CARE_PROVIDER_SITE_OTHER): Payer: Medicare Other | Admitting: Cardiovascular Disease

## 2019-05-19 ENCOUNTER — Other Ambulatory Visit: Payer: Self-pay

## 2019-05-19 VITALS — BP 118/70 | HR 60 | Ht 62.0 in | Wt 123.1 lb

## 2019-05-19 DIAGNOSIS — I480 Paroxysmal atrial fibrillation: Secondary | ICD-10-CM | POA: Diagnosis not present

## 2019-05-19 DIAGNOSIS — I10 Essential (primary) hypertension: Secondary | ICD-10-CM

## 2019-05-19 DIAGNOSIS — R946 Abnormal results of thyroid function studies: Secondary | ICD-10-CM | POA: Diagnosis not present

## 2019-05-19 NOTE — Patient Instructions (Addendum)
Medication Instructions:  *If you need a refill on your cardiac medications before your next appointment, please call your pharmacy*  Lab Work: Your physician recommends that you return for lab work in: 4 weeks for TSH and T4  If you have labs (blood work) drawn today and your tests are completely normal, you will receive your results only by: Marland Kitchen MyChart Message (if you have MyChart) OR . A paper copy in the mail If you have any lab test that is abnormal or we need to change your treatment, we will call you to review the results.  Testing/Procedures: None ordered today.  Follow-Up: At [facility], you and your health needs are our priority.  As part of our continuing mission to provide you with exceptional heart care, we have created designated Provider Care Teams.  These Care Teams include your primary Cardiologist (physician) and Advanced Practice Providers (APPs -  Physician Assistants and Nurse Practitioners) who all work together to provide you with the care you need, when you need it.  We recommend signing up for the patient portal called "MyChart".  Sign up information is provided on this After Visit Summary.  MyChart is used to connect with patients for Virtual Visits (Telemedicine).  Patients are able to view lab/test results, encounter notes, upcoming appointments, etc.  Non-urgent messages can be sent to your provider as well.   To learn more about what you can do with MyChart, go to [email or web address].    Your next appointment:   6 months  The format for your next appointment:   In Person  Provider:   You may see Jenkins Rouge, MD or one of the following Advanced Practice Providers on your designated Care Team:    Truitt Merle, NP  Cecilie Kicks, NP  Kathyrn Drown, NP

## 2019-06-16 ENCOUNTER — Other Ambulatory Visit: Payer: Medicare Other

## 2019-06-16 ENCOUNTER — Other Ambulatory Visit: Payer: Self-pay

## 2019-06-16 DIAGNOSIS — I10 Essential (primary) hypertension: Secondary | ICD-10-CM

## 2019-06-16 DIAGNOSIS — I480 Paroxysmal atrial fibrillation: Secondary | ICD-10-CM | POA: Diagnosis not present

## 2019-06-16 DIAGNOSIS — R946 Abnormal results of thyroid function studies: Secondary | ICD-10-CM

## 2019-06-16 LAB — TSH: TSH: 3.29 u[IU]/mL (ref 0.450–4.500)

## 2019-06-16 LAB — T4, FREE: Free T4: 1.35 ng/dL (ref 0.82–1.77)

## 2019-10-26 ENCOUNTER — Ambulatory Visit (HOSPITAL_COMMUNITY): Payer: Medicare Other | Admitting: Physician Assistant

## 2019-11-04 DIAGNOSIS — Z1231 Encounter for screening mammogram for malignant neoplasm of breast: Secondary | ICD-10-CM | POA: Diagnosis not present

## 2019-12-03 DIAGNOSIS — H02831 Dermatochalasis of right upper eyelid: Secondary | ICD-10-CM | POA: Diagnosis not present

## 2019-12-03 DIAGNOSIS — H02834 Dermatochalasis of left upper eyelid: Secondary | ICD-10-CM | POA: Diagnosis not present

## 2019-12-03 DIAGNOSIS — H52203 Unspecified astigmatism, bilateral: Secondary | ICD-10-CM | POA: Diagnosis not present

## 2019-12-03 DIAGNOSIS — H5500 Unspecified nystagmus: Secondary | ICD-10-CM | POA: Diagnosis not present

## 2019-12-14 DIAGNOSIS — Z23 Encounter for immunization: Secondary | ICD-10-CM | POA: Diagnosis not present

## 2020-01-11 ENCOUNTER — Other Ambulatory Visit: Payer: Self-pay | Admitting: Neurology

## 2020-01-11 ENCOUNTER — Other Ambulatory Visit: Payer: Self-pay | Admitting: Physician Assistant

## 2020-01-11 NOTE — Telephone Encounter (Signed)
Patient has follow up in dec

## 2020-02-25 DIAGNOSIS — L570 Actinic keratosis: Secondary | ICD-10-CM | POA: Diagnosis not present

## 2020-02-25 DIAGNOSIS — Z85828 Personal history of other malignant neoplasm of skin: Secondary | ICD-10-CM | POA: Diagnosis not present

## 2020-02-25 DIAGNOSIS — D044 Carcinoma in situ of skin of scalp and neck: Secondary | ICD-10-CM | POA: Diagnosis not present

## 2020-02-25 DIAGNOSIS — D225 Melanocytic nevi of trunk: Secondary | ICD-10-CM | POA: Diagnosis not present

## 2020-02-25 DIAGNOSIS — L821 Other seborrheic keratosis: Secondary | ICD-10-CM | POA: Diagnosis not present

## 2020-02-25 DIAGNOSIS — D485 Neoplasm of uncertain behavior of skin: Secondary | ICD-10-CM | POA: Diagnosis not present

## 2020-02-25 DIAGNOSIS — L814 Other melanin hyperpigmentation: Secondary | ICD-10-CM | POA: Diagnosis not present

## 2020-02-25 DIAGNOSIS — D1801 Hemangioma of skin and subcutaneous tissue: Secondary | ICD-10-CM | POA: Diagnosis not present

## 2020-02-25 DIAGNOSIS — Z85820 Personal history of malignant melanoma of skin: Secondary | ICD-10-CM | POA: Diagnosis not present

## 2020-03-13 NOTE — Progress Notes (Signed)
NEUROLOGY FOLLOW UP OFFICE NOTE  Brittany Oliver AS:[id number]   Subjective:  Brittany Wheelingis a 79 year old right-handed woman with hypertension, hypercholesterolemia, and history of posterior circulation stroke who follows up for atypical left-sided facial pain.  UPDATE: Current medication: Gabapentin 900 mg 3 times daily. Facial pain is overall controlled.  She tries to wait until 10 AM to take morning dose.  She will take a mid-afternoon dose (3 to 4 PM).  She takes a dose at 10 PM.  She has tramadol as needed for arthritic pain.  HISTORY: She began experiencing left-sided facial pain and 2006.She reports a fairly constant burning and pins and needles sensation in the left V1 and V2 distribution.Light touch, cold and wind can trigger a shooting pain down the face, but there is no spontaneous paroxysmal shooting pain. Eating and brushing her teeth does not aggravate it.She says heat seems to help relieve it.She was eventually diagnosed with left trigeminal neuralgia in 2012.  Past medications:Trileptal (caused hyponatremia) Other therapy:Gamma Knife x2 (ineffective).  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  She does have a history of brainstem stroke in 1987, in which she presented with left oculomotor weakness, left rotatory nystagmus, left facial numbness and dysfunction of her right arm and leg.She still has residual numbness of the left face.  In 2018, she endorsed balance problems and exhibited some symptoms of neuropathy in the feet. She underwent work-up for neuropathy. ANA, sed rate, B12, B6, TSH, SPEP and IFE were unremarkable. She was referred to physical therapy to help with balance. It was minimally effective but may have not been as proactive in home exercises. She has to  think before turning around too fast.No associated dizziness.  PAST MEDICAL HISTORY: Past Medical History:  Diagnosis Date  . Arthritis   . Cancer ([place]) 02/2013   skin melanoma  . Heart murmur   . Hyperlipidemia   . Hypertension   . Neuralgia facialis vera   . Stroke [facility]) 1987   has a little balance issue since stroke    MEDICATIONS: Current Outpatient Medications on File Prior to Visit  Medication Sig Dispense Refill  . albuterol (VENTOLIN HFA) 108 (90 Base) MCG/ACT inhaler Inhale 2 puffs into the lungs every 6 (six) hours as needed for wheezing or shortness of breath. 6.7 g 1  . amiodarone (PACERONE) 200 MG tablet Take 0.5 tablets (100 mg total) by mouth daily. 30 tablet 2  . Calcium Carbonate-Vitamin D (CALCIUM 600/VITAMIN D) 600-400 MG-UNIT chew tablet Chew 1 tablet by mouth daily.    Marland Kitchen FLECTOR 1.3 % PTCH Place 1 patch onto the skin as needed.     . Fluticasone-Umeclidin-Vilant (TRELEGY ELLIPTA) 100-62.5-25 MCG/INH AEPB Inhale into the lungs.    . furosemide (LASIX) 20 MG tablet Take 1 tablet (20 mg total) by mouth daily as needed. 90 tablet 3  . gabapentin (NEURONTIN) 300 MG capsule TAKE 1 CAPSULE 3 TIMES A   DAY 180 capsule 0  . gabapentin (NEURONTIN) 600 MG tablet Take 1 tablet (600 mg total) by mouth 3 (three) times daily. 270 tablet 3  . metoprolol tartrate (LOPRESSOR) 25 MG tablet TAKE 1/2 TABLET (12.5MG     TOTAL) TWO TIMES A DAY 90 tablet 1  . pantoprazole (PROTONIX) 40 MG tablet Take 1 tablet (40 mg total) by mouth 2 (two) times daily before a meal. 60 tablet 1  .  polyvinyl alcohol (LIQUIFILM TEARS) 1.4 % ophthalmic solution Place 1 drop into both eyes as needed for dry eyes.    . pravastatin (PRAVACHOL) 80 MG tablet Take 80 mg by mouth daily.    . Rivaroxaban (XARELTO) 15 MG TABS tablet Take 1 tablet (15 mg total) by mouth daily with supper. 30 tablet 3  . tiZANidine (ZANAFLEX) 4 MG tablet Take 4 mg by mouth every 6 (six) hours.    . traMADol (ULTRAM) 50 MG tablet  Take 50 mg by mouth every 6 (six) hours.    . vitamin C (ASCORBIC ACID) 500 MG tablet Take 500 mg by mouth daily.     No current facility-administered medications on file prior to visit.    ALLERGIES: Allergies  Allergen Reactions  . Trileptal [Oxcarbazepine] Other (See Comments)    Low potassium    FAMILY HISTORY: Family History  Adopted: Yes  Problem Relation Age of Onset  . Ataxia Neg Hx   . Chorea Neg Hx   . Dementia Neg Hx   . Mental retardation Neg Hx   . Migraines Neg Hx   . Multiple sclerosis Neg Hx   . Neurofibromatosis Neg Hx   . Neuropathy Neg Hx   . Parkinsonism Neg Hx   . Seizures Neg Hx   . Stroke Neg Hx   .  SOCIAL HISTORY: Social History   Socioeconomic History  . Marital status: Married    Spouse name: Not on file  . Number of children: Not on file  . Years of education: Not on file  . Highest education level: Not on file  Occupational History  . Not on file  Tobacco Use  . Smoking status: Former Smoker    Packs/day: 1.00    Years: 38.00    Pack years: 38.00    Types: Cigarettes    Start date: 40    Quit date: 03/23/2005    Years since quitting: 14.9  . Smokeless tobacco: Never Used  Vaping Use  . Vaping Use: Never used  Substance and Sexual Activity  . Alcohol use: Not Currently    Alcohol/week: 5.0 standard drinks    Types: 5 Glasses of wine per week  . Drug use: No  . Sexual activity: Yes    Partners: Male  Other Topics Concern  . Not on file  Social History Narrative   Right handed   One story home   Drinks caffeine daily 2 cups    Social Determinants of Health   Financial Resource Strain: Not on file  Food Insecurity: Not on file  Transportation Needs: Not on file  Physical Activity: Not on file  Stress: Not on file  Social Connections: Not on file  Intimate Partner Violence: Not on file     Objective:  Blood pressure (!) 166/97, pulse 64, height 5\' 2"  (1.575 m), weight 127 lb 12.8 oz (58 kg), SpO2 94 %. General: No  acute distress.  Patient appears well-groomed.   Head:  Normocephalic/atraumatic Eyes:  Fundi examined but not visualized Neck: supple, no paraspinal tenderness, full range of motion Heart:  Regular rate and rhythm Lungs:  Clear to auscultation bilaterally Back: No paraspinal tenderness Neurological Exam: alert and oriented to person, place, and time. Attention span and concentration intact, recent and remote memory intact, fund of knowledge intact.  Speech fluent and not dysarthric, language intact.  CN II-XII intact. Bulk and tone normal, muscle strength 5/5 throughout.  Sensation to light touch, temperature and vibration intact.  Deep tendon reflexes  2+ throughout, toes downgoing.  Finger to nose and heel to shin testing intact.  Gait normal, Romberg negative.   Assessment/Plan:   Atypical left-sided facial pain  1.  Gabapentin 900mg  three times daily (takes 300mg  with 600mg  capsules with each dose) 2.  Follow up one year  Metta Clines, DO  CC: [name], MD

## 2020-03-14 ENCOUNTER — Encounter: Payer: Self-pay | Admitting: Neurology

## 2020-03-14 ENCOUNTER — Ambulatory Visit (INDEPENDENT_AMBULATORY_CARE_PROVIDER_SITE_OTHER): Payer: Medicare Other | Admitting: Neurology

## 2020-03-14 ENCOUNTER — Other Ambulatory Visit: Payer: Self-pay

## 2020-03-14 VITALS — BP 166/97 | HR 64 | Ht 62.0 in | Wt 127.8 lb

## 2020-03-14 DIAGNOSIS — G501 Atypical facial pain: Secondary | ICD-10-CM

## 2020-03-14 MED ORDER — GABAPENTIN 600 MG PO TABS: 600.0000 mg | ORAL_TABLET | Freq: Three times a day (TID) | ORAL | 1 refills | Status: DC

## 2020-03-14 MED ORDER — GABAPENTIN 300 MG PO CAPS: ORAL_CAPSULE | ORAL | 1 refills | Status: DC

## 2020-03-14 NOTE — Patient Instructions (Signed)
Gabapentin refilled

## 2020-03-23 DIAGNOSIS — M5137 Other intervertebral disc degeneration, lumbosacral region: Secondary | ICD-10-CM | POA: Diagnosis not present

## 2020-03-23 NOTE — Progress Notes (Signed)
Virtual Visit via Video Note   This visit type was conducted due to national recommendations for restrictions regarding the COVID-19 Pandemic (e.g. social distancing) in an effort to limit this patient's exposure and mitigate transmission in our community.  Due to her co-morbid illnesses, this patient is at least at moderate risk for complications without adequate follow up.  This format is felt to be most appropriate for this patient at this time.  All issues noted in this document were discussed and addressed.  A limited physical exam was performed with this format.  Please refer to the patient's chart for her consent to telehealth for [facility].   Patient location : Home Physician location: Office  Date:  03/23/2020   ID:  Brittany Oliver, DOB 09/23/1940, MRN [id number]  PCP:  [name], MD  Cardiologist: Dr. Johnsie Cancel   Chief Complaint: PAF   History of Present Illness:   80 y.o. with remote CVA in 89', HLD, HTN first seen for PAD when hospitalized November 2020 with GIB and GI illness. She was Rx with flecainide/amiodarone and converted. CHADVASC 6 and post d/c monitor showed no recurrence 03/09/20 She subsequently has been taken off amiodarone and had some thyroid irregularities. She has stopped xarelto and does not want to go back on it unless she has a recurrence She has cardia device to use with phone to document   TTE 01/24/19 EF 60-65% normal atrial sizes and no significant valve disease   No cardiac symptoms Husband has multiple myeloma and getting injections qow With this and COVID   Past Medical History:  Diagnosis Date  . Arthritis   . Cancer ([place]) 02/2013   skin melanoma  . Heart murmur   . Hyperlipidemia   . Hypertension   . Neuralgia facialis vera   . Stroke [facility]) 1987   has a little balance issue since stroke    Past Surgical History:  Procedure Laterality Date  . ABDOMINAL HYSTERECTOMY  1974   pt still has her uterus, took only 1 ovary and 1 tube  .  APPENDECTOMY    . COLONOSCOPY    . DILATION AND CURETTAGE OF UTERUS  1965  . ESOPHAGOGASTRODUODENOSCOPY (EGD) WITH PROPOFOL Left 01/23/2019   Procedure: ESOPHAGOGASTRODUODENOSCOPY (EGD) WITH PROPOFOL;  Surgeon: Arta Silence, MD;  [facility]: [facility];  Service: Endoscopy;  Laterality: Left;  . EXCISION MELANOMA WITH SENTINEL LYMPH NODE BIOPSY Left 03/27/2013   Procedure: EXCISION MELANOMA LEFT POSTERIOR ARM WITH LEFT AXILLARY SENTINEL LYMPH NODE BIOPSY;  Surgeon: Zenovia Jarred, MD;  Location: [place];  Service: General;  Laterality: Left;  . EYE SURGERY Bilateral 2006   cataract and laser surgery  . FINGER SURGERY Right    middle finger  . MASS EXCISION Left 09/15/2013   Procedure: EXCISION MASS LEFT UPPER ARM;  Surgeon: Zenovia Jarred, MD;  Location: [place];  Service: General;  Laterality: Left;  . TONSILLECTOMY      Current Medications: Prior to Admission medications   Medication Sig Start Date End Date Taking? Authorizing Provider  albuterol (VENTOLIN HFA) 108 (90 Base) MCG/ACT inhaler Inhale 2 puffs into the lungs every 6 (six) hours as needed for wheezing or shortness of breath. 01/28/19   Harold Hedge, MD  amiodarone (PACERONE) 200 MG tablet Take 2 tablets (400 mg total) by mouth 2 (two) times daily for 4 days, THEN 1 tablet (200 mg total) daily. 01/28/19 04/02/19  Harold Hedge, MD  Calcium Carbonate-Vitamin D (CALCIUM 600/VITAMIN D) 600-400 MG-UNIT chew tablet  Chew 1 tablet by mouth daily.    [provider]  FLECTOR 1.3 % PTCH Place 1 patch onto the skin daily.  11/23/14   [provider]  Fluticasone-Umeclidin-Vilant (TRELEGY ELLIPTA) 100-62.5-25 MCG/INH AEPB Inhale into the lungs.    [provider]  furosemide (LASIX) 40 MG tablet Take 0.5 tablets (20 mg total) by mouth daily. 01/29/19   Harold Hedge, MD  gabapentin (NEURONTIN) 300 MG capsule Take 1 capsule (300 mg total) by mouth 3 (three) times daily. 04/17/18   Pieter Partridge, DO  metoprolol  tartrate (LOPRESSOR) 25 MG tablet Take 1 tablet (25 mg total) by mouth 2 (two) times daily. 01/28/19   Harold Hedge, MD  ondansetron (ZOFRAN) 8 MG tablet Take 8 mg by mouth once.    [provider]  pantoprazole (PROTONIX) 40 MG tablet Take 1 tablet (40 mg total) by mouth 2 (two) times daily before a meal. 01/28/19   Harold Hedge, MD  polyvinyl alcohol (LIQUIFILM TEARS) 1.4 % ophthalmic solution Place 1 drop into both eyes as needed for dry eyes.    [provider]  pravastatin (PRAVACHOL) 80 MG tablet Take 80 mg by mouth daily.    [provider]  valsartan-hydrochlorothiazide (DIOVAN-HCT) 320-12.5 MG tablet Take 1 tablet by mouth daily. 11/22/18   [provider]  vitamin C (ASCORBIC ACID) 500 MG tablet Take 500 mg by mouth daily.    [provider]    Allergies:   Trileptal [oxcarbazepine]   Social History   Socioeconomic History  . Marital status: Married    Spouse name: Not on file  . Number of children: Not on file  . Years of education: Not on file  . Highest education level: Not on file  Occupational History  . Not on file  Tobacco Use  . Smoking status: Former Smoker    Packs/day: 1.00    Years: 38.00    Pack years: 38.00    Types: Cigarettes    Start date: 70    Quit date: 03/23/2005    Years since quitting: 15.0  . Smokeless tobacco: Never Used  Vaping Use  . Vaping Use: Never used  Substance and Sexual Activity  . Alcohol use: Not Currently    Alcohol/week: 5.0 standard drinks    Types: 5 Glasses of wine per week  . Drug use: No  . Sexual activity: Yes    Partners: Male  Other Topics Concern  . Not on file  Social History Narrative   Right handed   One story home   Drinks caffeine daily 2 cups    Social Determinants of Health   Financial Resource Strain: Not on file  Food Insecurity: Not on file  Transportation Needs: Not on file  Physical Activity: Not on file  Stress: Not on file  Social Connections:  Not on file     Family History:  The patient's family history is not on file. She was adopted.   ROS:   Please see the history of present illness.    ROS All other systems reviewed and are negative.   PHYSICAL EXAM:   VS:  There were no vitals taken for this visit.    Telephone no exam   Wt Readings from Last 3 Encounters:  03/14/20 58 kg  05/19/19 55.8 kg  04/27/19 57.4 kg      Studies/Labs Reviewed:   EKG:   SB rate 49 04/27/19   Recent Labs: 03/26/2019: BUN 17; Creatinine, Ser  0.91; Potassium 4.1; Sodium 139 06/16/2019: TSH 3.290   Lipid Panel No results found for: CHOL, TRIG, HDL, CHOLHDL, VLDL, LDLCALC, LDLDIRECT  Additional studies/ records that were reviewed today include:   As summarized above    ASSESSMENT & PLAN:    1. PAF - CHADSVASCs score of 6. Continue low dose beta blocker she does not Want to resume anticoagulation Monitor with cartia device   2. Abnormal thyroid function -  TSH normal 3 off Amiodarone now   3.  Hypertension -Blood pressure stable.  Reduced metoprolol as above.  Continue valsartan/hydrochlorothiazide at current dose.   Medication Adjustments/Labs and Tests Ordered: Current medicines are reviewed at length with the patient today.  Concerns regarding medicines are outlined above.  Medication changes, Labs and Tests ordered today are listed in the Patient Instructions below. There are no Patient Instructions on file for this visit.   F/U in a year  Time:  Spent reviewing chart cardiac monitor echo direct patient interview and composing notes 20 minutes   Signed, Jenkins Rouge, MD  03/23/2020 4:22 PM    [facility] [address], [place], [place]  [address] Phone: [phone]; Fax: [phone]

## 2020-03-25 ENCOUNTER — Other Ambulatory Visit: Payer: Self-pay

## 2020-03-25 ENCOUNTER — Telehealth (INDEPENDENT_AMBULATORY_CARE_PROVIDER_SITE_OTHER): Payer: Medicare Other | Admitting: Cardiovascular Disease

## 2020-03-25 VITALS — BP 144/74 | HR 68 | Ht 62.0 in | Wt 126.0 lb

## 2020-03-25 DIAGNOSIS — I480 Paroxysmal atrial fibrillation: Secondary | ICD-10-CM

## 2020-03-25 NOTE — Patient Instructions (Signed)

## 2020-03-28 DIAGNOSIS — D044 Carcinoma in situ of skin of scalp and neck: Secondary | ICD-10-CM | POA: Diagnosis not present

## 2020-07-11 ENCOUNTER — Other Ambulatory Visit: Payer: Self-pay | Admitting: Cardiovascular Disease

## 2020-08-01 ENCOUNTER — Other Ambulatory Visit: Payer: Self-pay | Admitting: Neurology

## 2020-08-24 DIAGNOSIS — I480 Paroxysmal atrial fibrillation: Secondary | ICD-10-CM | POA: Diagnosis not present

## 2020-08-24 DIAGNOSIS — J449 Chronic obstructive pulmonary disease, unspecified: Secondary | ICD-10-CM | POA: Diagnosis not present

## 2020-08-24 DIAGNOSIS — E785 Hyperlipidemia, unspecified: Secondary | ICD-10-CM | POA: Diagnosis not present

## 2020-08-24 DIAGNOSIS — D6869 Other thrombophilia: Secondary | ICD-10-CM | POA: Diagnosis not present

## 2020-08-24 DIAGNOSIS — E559 Vitamin D deficiency, unspecified: Secondary | ICD-10-CM | POA: Diagnosis not present

## 2020-08-24 DIAGNOSIS — I10 Essential (primary) hypertension: Secondary | ICD-10-CM | POA: Diagnosis not present

## 2020-09-09 NOTE — Progress Notes (Signed)
Virtual Visit via Video Note   This visit type was conducted due to national recommendations for restrictions regarding the COVID-19 Pandemic (e.g. social distancing) in an effort to limit this patient's exposure and mitigate transmission in our community.  Due to her co-morbid illnesses, this patient is at least at moderate risk for complications without adequate follow up.  This format is felt to be most appropriate for this patient at this time.  All issues noted in this document were discussed and addressed.  A limited physical exam was performed with this format.  Please refer to the patient's chart for her consent to telehealth for [facility].   Patient location : Home Physician location: Office  Date:  09/09/2020   ID:  Brittany Oliver, DOB [birth year], MRN [id number]  PCP:  [name], MD  Cardiologist: Dr. Johnsie Cancel   Chief Complaint: PAF   History of Present Illness:   80 y.o. with remote CVA in 74', HLD, HTN first seen for PAD when hospitalized November 2020 with GIB and GI illness. She was Rx with flecainide/amiodarone and converted. CHADVASC 6 and post d/c monitor showed no recurrence 03/09/20 She subsequently has been taken off amiodarone and had some thyroid irregularities. She has stopped xarelto and does not want to go back on it unless she has a recurrence She has cardia device to use with phone to document   TTE 01/24/19 EF 60-65% normal atrial sizes and no significant valve disease   No cardiac symptoms Husband has multiple myeloma and getting injections qow With this and COVID   She has no cardiac symptom  Past Medical History:  Diagnosis Date   Arthritis    Cancer ([place]) 02/2013   skin melanoma   Heart murmur    Hyperlipidemia    Hypertension    Neuralgia facialis vera    Stroke ([place]) 1987   has a little balance issue since stroke    Past Surgical History:  Procedure Laterality Date   ABDOMINAL HYSTERECTOMY  1974   pt still has her uterus, took only  1 ovary and 1 tube   APPENDECTOMY     COLONOSCOPY     DILATION AND CURETTAGE OF UTERUS  1965   ESOPHAGOGASTRODUODENOSCOPY (EGD) WITH PROPOFOL Left 01/23/2019   Procedure: ESOPHAGOGASTRODUODENOSCOPY (EGD) WITH PROPOFOL;  Surgeon: Arta Silence, MD;  [facility];  Service: Endoscopy;  Laterality: Left;   EXCISION MELANOMA WITH SENTINEL LYMPH NODE BIOPSY Left 03/27/2013   Procedure: EXCISION MELANOMA LEFT POSTERIOR ARM WITH LEFT AXILLARY SENTINEL LYMPH NODE BIOPSY;  Surgeon: Zenovia Jarred, MD;  Location: [place];  Service: General;  Laterality: Left;   EYE SURGERY Bilateral 2006   cataract and laser surgery   FINGER SURGERY Right    middle finger   MASS EXCISION Left 09/15/2013   Procedure: EXCISION MASS LEFT UPPER ARM;  Surgeon: Zenovia Jarred, MD;  Location: [place];  Service: General;  Laterality: Left;   TONSILLECTOMY      Current Medications: Prior to Admission medications   Medication Sig Start Date End Date Taking? Authorizing Provider  albuterol (VENTOLIN HFA) 108 (90 Base) MCG/ACT inhaler Inhale 2 puffs into the lungs every 6 (six) hours as needed for wheezing or shortness of breath. 01/28/19   Harold Hedge, MD  amiodarone (PACERONE) 200 MG tablet Take 2 tablets (400 mg total) by mouth 2 (two) times daily for 4 days, THEN 1 tablet (200 mg total) daily. 01/28/19 04/02/19  Harold Hedge, MD  Calcium Carbonate-Vitamin D (CALCIUM  600/VITAMIN D) 600-400 MG-UNIT chew tablet Chew 1 tablet by mouth daily.    [provider]  FLECTOR 1.3 % PTCH Place 1 patch onto the skin daily.  11/23/14   [provider]  Fluticasone-Umeclidin-Vilant (TRELEGY ELLIPTA) 100-62.5-25 MCG/INH AEPB Inhale into the lungs.    [provider]  furosemide (LASIX) 40 MG tablet Take 0.5 tablets (20 mg total) by mouth daily. 01/29/19   Harold Hedge, MD  gabapentin (NEURONTIN) 300 MG capsule Take 1 capsule (300 mg total) by mouth 3 (three) times daily. 04/17/18   Pieter Partridge, DO   metoprolol tartrate (LOPRESSOR) 25 MG tablet Take 1 tablet (25 mg total) by mouth 2 (two) times daily. 01/28/19   Harold Hedge, MD  ondansetron (ZOFRAN) 8 MG tablet Take 8 mg by mouth once.    [provider]  pantoprazole (PROTONIX) 40 MG tablet Take 1 tablet (40 mg total) by mouth 2 (two) times daily before a meal. 01/28/19   Harold Hedge, MD  polyvinyl alcohol (LIQUIFILM TEARS) 1.4 % ophthalmic solution Place 1 drop into both eyes as needed for dry eyes.    [provider]  pravastatin (PRAVACHOL) 80 MG tablet Take 80 mg by mouth daily.    [provider]  valsartan-hydrochlorothiazide (DIOVAN-HCT) 320-12.5 MG tablet Take 1 tablet by mouth daily. 11/22/18   [provider]  vitamin C (ASCORBIC ACID) 500 MG tablet Take 500 mg by mouth daily.    [provider]    Allergies:   Trileptal [oxcarbazepine]   Social History   Socioeconomic History   Marital status: Married    Spouse name: Not on file   Number of children: Not on file   Years of education: Not on file   Highest education level: Not on file  Occupational History   Not on file  Tobacco Use   Smoking status: Former    Packs/day: 1.00    Years: 38.00    Pack years: 38.00    Types: Cigarettes    Start date: 63    Quit date: 03/23/2005    Years since quitting: 15.4   Smokeless tobacco: Never  Vaping Use   Vaping Use: Never used  Substance and Sexual Activity   Alcohol use: Not Currently    Alcohol/week: 5.0 standard drinks    Types: 5 Glasses of wine per week   Drug use: No   Sexual activity: Yes    Partners: Male  Other Topics Concern   Not on file  Social History Narrative   Right handed   One story home   Drinks caffeine daily 2 cups    Social Determinants of Health   Financial Resource Strain: Not on file  Food Insecurity: Not on file  Transportation Needs: Not on file  Physical Activity: Not on file  Stress: Not on file  Social Connections: Not on file      Family History:  The patient's family history is not on file. She was adopted.   ROS:   Please see the history of present illness.    ROS All other systems reviewed and are negative.   PHYSICAL EXAM:   VS:  There were no vitals taken for this visit.    Telephone no exam   Wt Readings from Last 3 Encounters:  03/25/20 57.2 kg  03/14/20 58 kg  05/19/19 55.8 kg      Studies/Labs Reviewed:   EKG:   SB rate 49 04/27/19   Recent Labs: No  results found for requested labs within last 8760 hours.   Lipid Panel No results found for: CHOL, TRIG, HDL, CHOLHDL, VLDL, LDLCALC, LDLDIRECT  Additional studies/ records that were reviewed today include:   As summarized above    ASSESSMENT & PLAN:    1. PAF - CHADSVASCs score of 6. Continue low dose beta blocker she does not Want to resume anticoagulation Monitor with cartia device   2. Abnormal thyroid function -  TSH normal 3 off Amiodarone now   3.  Hypertension -Blood pressure stable.  Reduced metoprolol as above.  Continue valsartan/hydrochlorothiazide at current dose.   Medication Adjustments/Labs and Tests Ordered: Current medicines are reviewed at length with the patient today.  Concerns regarding medicines are outlined above.  Medication changes, Labs and Tests ordered today are listed in the Patient Instructions below. There are no Patient Instructions on file for this visit.   F/U in a year  Time:  Spent reviewing chart cardiac monitor echo direct patient interview and composing notes 20 minutes   Signed, Jenkins Rouge, MD  09/09/2020 7:00 PM    [address], [place], [place]  [address] Phone: [phone]; Fax: [phone]

## 2020-09-23 ENCOUNTER — Telehealth (INDEPENDENT_AMBULATORY_CARE_PROVIDER_SITE_OTHER): Payer: Medicare Other | Admitting: Cardiovascular Disease

## 2020-09-23 ENCOUNTER — Other Ambulatory Visit: Payer: Self-pay

## 2020-09-23 VITALS — BP 131/70 | HR 61 | Ht 62.0 in | Wt 131.0 lb

## 2020-09-23 DIAGNOSIS — I480 Paroxysmal atrial fibrillation: Secondary | ICD-10-CM

## 2020-09-23 DIAGNOSIS — I10 Essential (primary) hypertension: Secondary | ICD-10-CM

## 2020-09-23 DIAGNOSIS — R7989 Other specified abnormal findings of blood chemistry: Secondary | ICD-10-CM

## 2020-09-23 NOTE — Patient Instructions (Addendum)
Medication Instructions:  Your physician recommends that you continue on your current medications as directed. Please refer to the Current Medication list given to you today.  *If you need a refill on your cardiac medications before your next appointment, please call your pharmacy*  Lab Work: If you have labs (blood work) drawn today and your tests are completely normal, you will receive your results only by: [place] (if you have MyChart) OR A paper copy in the mail If you have any lab test that is abnormal or we need to change your treatment, we will call you to review the results.  Testing/Procedures: None ordered today.  Follow-Up: At [facility], you and your health needs are our priority.  As part of our continuing mission to provide you with exceptional heart care, we have created designated Provider Care Teams.  These Care Teams include your primary Cardiologist (physician) and Advanced Practice Providers (APPs -  Physician Assistants and Nurse Practitioners) who all work together to provide you with the care you need, when you need it.  We recommend signing up for the patient portal called "MyChart".  Sign up information is provided on this After Visit Summary.  MyChart is used to connect with patients for Virtual Visits (Telemedicine).  Patients are able to view lab/test results, encounter notes, upcoming appointments, etc.  Non-urgent messages can be sent to your provider as well.   To learn more about what you can do with MyChart, go to [email or web address].    Your next appointment:   12 month(s)  The format for your next appointment:   In Person  Provider:   You may see Jenkins Rouge, MD or one of the following Advanced Practice Providers on your designated Care Team:   Cecilie Kicks, NP

## 2020-11-04 DIAGNOSIS — Z1231 Encounter for screening mammogram for malignant neoplasm of breast: Secondary | ICD-10-CM | POA: Diagnosis not present

## 2020-11-10 DIAGNOSIS — L245 Irritant contact dermatitis due to other chemical products: Secondary | ICD-10-CM | POA: Diagnosis not present

## 2020-12-28 DIAGNOSIS — Z961 Presence of intraocular lens: Secondary | ICD-10-CM | POA: Diagnosis not present

## 2020-12-28 DIAGNOSIS — H52203 Unspecified astigmatism, bilateral: Secondary | ICD-10-CM | POA: Diagnosis not present

## 2021-03-22 DIAGNOSIS — Z85828 Personal history of other malignant neoplasm of skin: Secondary | ICD-10-CM | POA: Diagnosis not present

## 2021-03-22 DIAGNOSIS — L570 Actinic keratosis: Secondary | ICD-10-CM | POA: Diagnosis not present

## 2021-03-22 DIAGNOSIS — D1801 Hemangioma of skin and subcutaneous tissue: Secondary | ICD-10-CM | POA: Diagnosis not present

## 2021-03-22 DIAGNOSIS — Z85820 Personal history of malignant melanoma of skin: Secondary | ICD-10-CM | POA: Diagnosis not present

## 2021-03-22 DIAGNOSIS — L853 Xerosis cutis: Secondary | ICD-10-CM | POA: Diagnosis not present

## 2021-03-22 DIAGNOSIS — L821 Other seborrheic keratosis: Secondary | ICD-10-CM | POA: Diagnosis not present

## 2021-03-22 DIAGNOSIS — L814 Other melanin hyperpigmentation: Secondary | ICD-10-CM | POA: Diagnosis not present

## 2021-03-22 DIAGNOSIS — L720 Epidermal cyst: Secondary | ICD-10-CM | POA: Diagnosis not present

## 2021-03-27 NOTE — Progress Notes (Signed)
NEUROLOGY FOLLOW UP OFFICE NOTE  Brittany Oliver [id number]  Assessment/Plan:   Atypical left-sided facial pain Hypertension  Will prescribe baclofen 5mg  to take for breakthrough pain.  May take up to three times daily. Gabapentin 900mg  three times daily (takes 300mg  with 600mg  capsule with each dose) Follow up with PCP regarding blood pressure Follow up one year  Subjective:  Brittany Oliver is a 81 year old right-handed woman with hypertension, hypercholesterolemia, and history of posterior circulation stroke who follows up for atypical left-sided facial pain.   UPDATE:  Current medication: Gabapentin 900 mg 3 times daily (6 hours apart starting at 7AM). Facial pain is overall controlled.  She sometimes has a breakthrough pain 2 to 3 times a week.  Sometimes 2 hours before the next dose, she may get the breakthrough pain.     HISTORY:  She began experiencing left-sided facial pain and 2006.  She reports a fairly constant burning and pins and needles sensation in the left V1 and V2 distribution.  Light touch, cold and wind can trigger a shooting pain down the face, but there is no spontaneous paroxysmal shooting pain. Eating and brushing her teeth does not aggravate it.  She says heat seems to help relieve it.  She was eventually diagnosed with left trigeminal neuralgia in 2012.   Past medications:  Trileptal (caused hyponatremia) Other therapy:  Gamma Knife x2 (ineffective).   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   She does have a history of brainstem stroke in 1987, in which she presented with left oculomotor weakness, left rotatory nystagmus, left facial numbness and dysfunction of her right arm and leg.  She still has residual numbness of the left face.   In 2018, she endorsed balance problems and  exhibited some symptoms of neuropathy in the feet.  She underwent work-up for neuropathy.  ANA, sed rate, B12, B6, TSH, SPEP and IFE were unremarkable.  She was referred to physical therapy to help with balance.  It was minimally effective but may have not been as proactive in home exercises.  She has to think before turning around too fast.  No associated dizziness.  PAST MEDICAL HISTORY: Past Medical History:  Diagnosis Date   Arthritis    Cancer ([place]) 02/2013   skin melanoma   Heart murmur    Hyperlipidemia    Hypertension    Neuralgia facialis vera    Stroke ([place]) 1987   has a little balance issue since stroke    MEDICATIONS: Current Outpatient Medications on File Prior to Visit  Medication Sig Dispense Refill   albuterol (VENTOLIN HFA) 108 (90 Base) MCG/ACT inhaler Inhale 2 puffs into the lungs every 6 (six) hours as needed for wheezing or shortness of breath. 6.7 g 1   Calcium Carbonate-Vitamin D (CALCIUM 600/VITAMIN D) 600-400 MG-UNIT chew tablet Chew 1 tablet by mouth daily.     FLECTOR 1.3 % PTCH Place 1 patch onto the skin as needed.      Fluticasone-Umeclidin-Vilant 100-62.5-25 MCG/INH AEPB Inhale into the lungs.     gabapentin (NEURONTIN) 300 MG capsule TAKE 1 CAPSULE 3 TIMES A   DAY 270 capsule 1   gabapentin (NEURONTIN) 600 MG tablet TAKE 1 TABLET 3 TIMES A DAY 270 tablet 1   metoprolol tartrate (LOPRESSOR) 25 MG tablet TAKE 1/2 TABLET TWICE A DAY 90 tablet 3   polyvinyl  alcohol (LIQUIFILM TEARS) 1.4 % ophthalmic solution Place 1 drop into both eyes as needed for dry eyes.     pravastatin (PRAVACHOL) 80 MG tablet Take 80 mg by mouth daily.     traMADol (ULTRAM) 50 MG tablet Take 50 mg by mouth every 6 (six) hours.     vitamin C (ASCORBIC ACID) 500 MG tablet Take 500 mg by mouth daily.     No current facility-administered medications on file prior to visit.    ALLERGIES: Allergies  Allergen Reactions   Trileptal [Oxcarbazepine] Other (See Comments)    Low potassium     FAMILY HISTORY: Family History  Adopted: Yes  Problem Relation Age of Onset   Ataxia Neg Hx    Chorea Neg Hx    Dementia Neg Hx    Mental retardation Neg Hx    Migraines Neg Hx    Multiple sclerosis Neg Hx    Neurofibromatosis Neg Hx    Neuropathy Neg Hx    Parkinsonism Neg Hx    Seizures Neg Hx    Stroke Neg Hx       Objective:  Blood pressure (!) 166/73, pulse 70, height 5\' 2"  (1.575 m), weight 137 lb 9.6 oz (62.4 kg), SpO2 91 %. General: No acute distress.  Patient appears well-groomed.   Head:  Normocephalic/atraumatic Eyes:  Fundi examined but not visualized Neck: supple, no paraspinal tenderness, full range of motion Heart:  Regular rate and rhythm Lungs:  Clear to auscultation bilaterally Back: No paraspinal tenderness Neurological Exam: alert and oriented to person, place, and time.  Speech fluent and not dysarthric, language intact.  CN II-XII intact. Bulk and tone normal, muscle strength 5/5 throughout.  Sensation to light touch intact.  Deep tendon reflexes 2+ throughout, toes downgoing.  Finger to nose testing intact.  Gait normal, Romberg negative.   Metta Clines, DO  CC: [name], MD

## 2021-03-28 ENCOUNTER — Ambulatory Visit (INDEPENDENT_AMBULATORY_CARE_PROVIDER_SITE_OTHER): Payer: Medicare Other | Admitting: Neurology

## 2021-03-28 ENCOUNTER — Other Ambulatory Visit: Payer: Self-pay

## 2021-03-28 VITALS — BP 166/73 | HR 70 | Ht 62.0 in | Wt 137.6 lb

## 2021-03-28 DIAGNOSIS — G501 Atypical facial pain: Secondary | ICD-10-CM

## 2021-03-28 DIAGNOSIS — I10 Essential (primary) hypertension: Secondary | ICD-10-CM

## 2021-03-28 MED ORDER — BACLOFEN 5 MG PO TABS: ORAL_TABLET | ORAL | 1 refills | Status: DC

## 2021-03-28 MED ORDER — GABAPENTIN 600 MG PO TABS: 600.0000 mg | ORAL_TABLET | Freq: Three times a day (TID) | ORAL | 1 refills | Status: DC

## 2021-03-28 MED ORDER — GABAPENTIN 300 MG PO CAPS: ORAL_CAPSULE | ORAL | 1 refills | Status: DC

## 2021-03-28 NOTE — Patient Instructions (Signed)
In addition to gabapentin, try baclofen 5mg .  You may take it 3 times daily.  Try taking it first for breakthrough

## 2021-03-29 ENCOUNTER — Encounter: Payer: Self-pay | Admitting: Neurology

## 2021-04-10 IMAGING — DX DG CHEST 1V PORT
1 series · 1 of 1 positions shown · non-contrast
Comparison: Chest radiograph 01/23/2019

CLINICAL DATA: Shortness of breath

EXAM:
PORTABLE CHEST 1 VIEW

[chest ap]
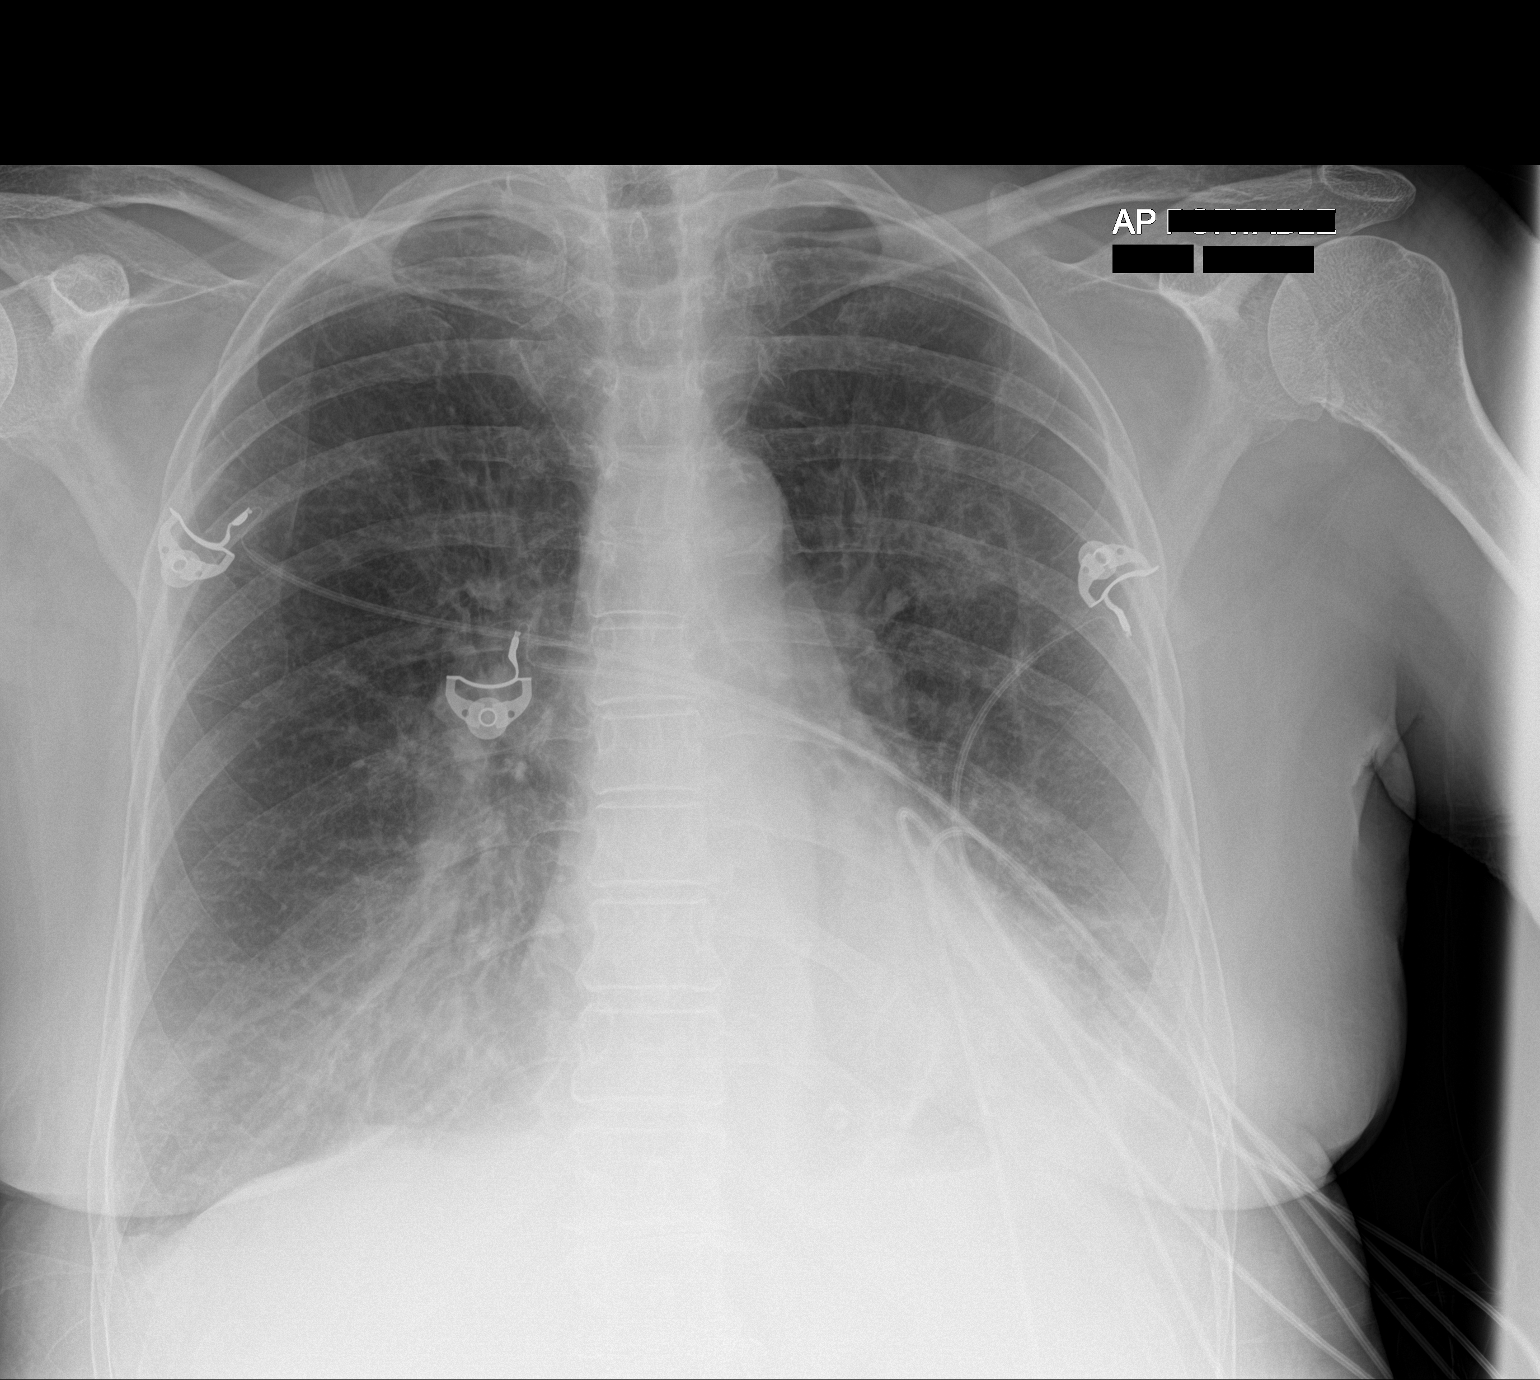

[1 of 1 positions shown; findings below may reference images not displayed]

FINDINGS: Monitoring leads overlie the patient. Stable cardiac and mediastinal
contours. Slight interval improvement left mid lower lung
consolidation. New right lower lung consolidation. Small left
greater than right bilateral pleural effusions.
IMPRESSION: 1. Slight interval improvement left mid lower lung consolidation.
2. New right lower lung consolidation concerning for pneumonia or
aspiration.
3. Small left greater than right pleural effusions.

## 2021-04-11 IMAGING — DX DG CHEST 1V PORT
1 series · 1 of 1 positions shown · non-contrast
Comparison: Chest radiograph 01/25/2019

CLINICAL DATA: Shortness of breath and weakness.

EXAM:
PORTABLE CHEST 1 VIEW

[chest ap]
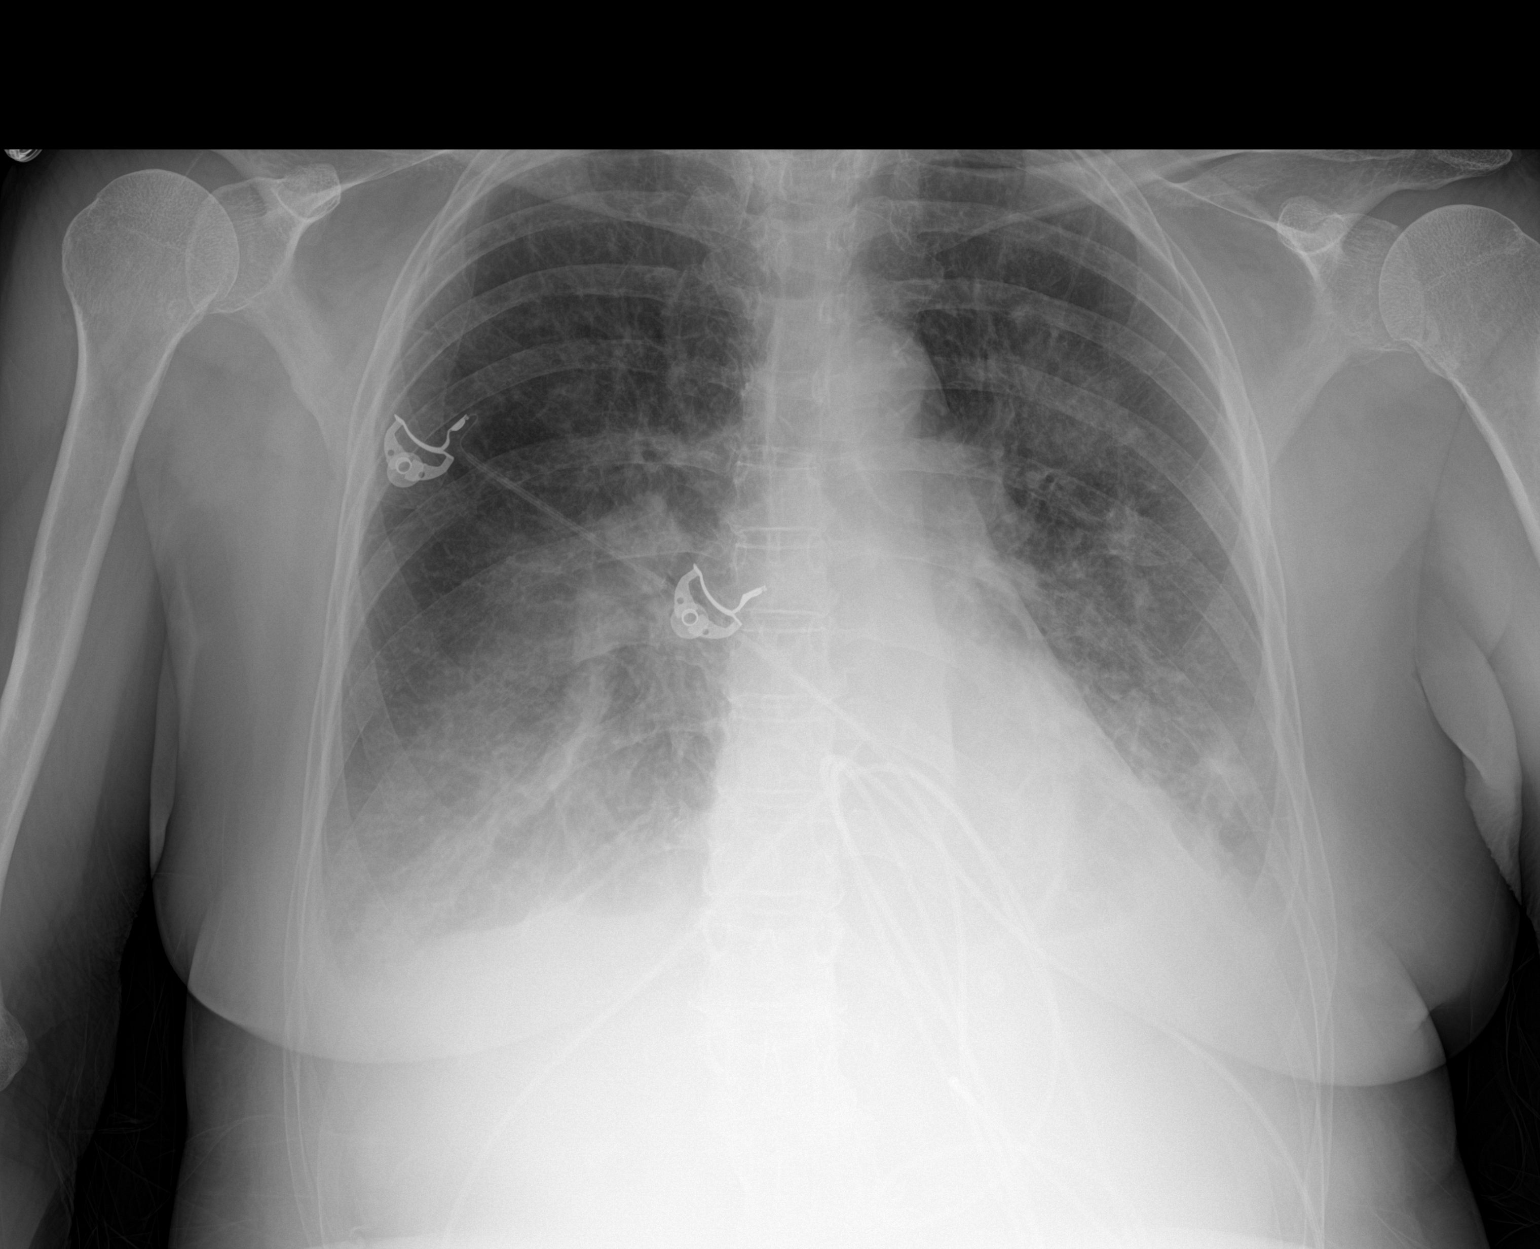

[1 of 1 positions shown; findings below may reference images not displayed]

FINDINGS: Overlying monitoring leads. The cardiomediastinal silhouette is
unchanged. Redemonstrated airspace disease within the bilateral mid
to lower lungs as well as bilateral pleural effusions. The
right-sided airspace disease and pleural effusion appear increased
from prior exam. No evidence of pneumothorax. No acute bony
abnormality.
IMPRESSION: Bilateral airspace disease within the mid to lower lungs as well as
bilateral pleural effusions, increased on the right as compared to
prior exam.

## 2021-04-12 IMAGING — DX DG CHEST 2V
3 series · 3 of 3 positions shown · non-contrast
Comparison: Chest radiograph dated 01/26/2019

CLINICAL DATA: 78-year-old female with fever. Concern for
aspiration.

EXAM:
CHEST - 2 VIEW

[chest pa (1 of 2)]
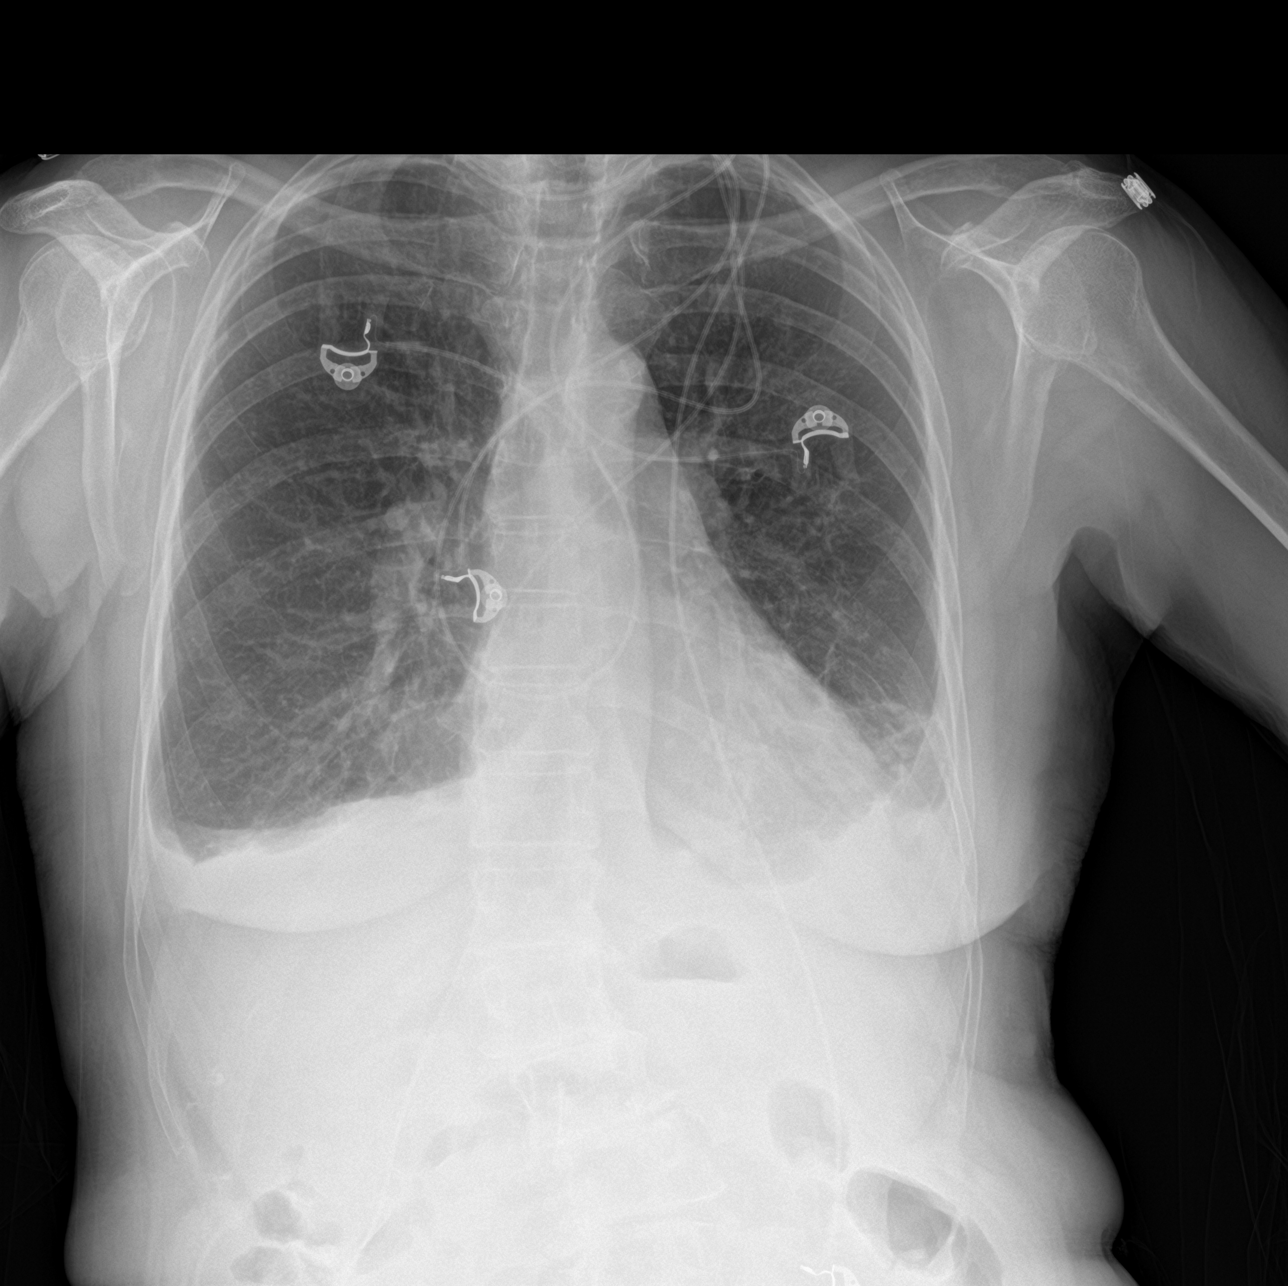

[chest lat]
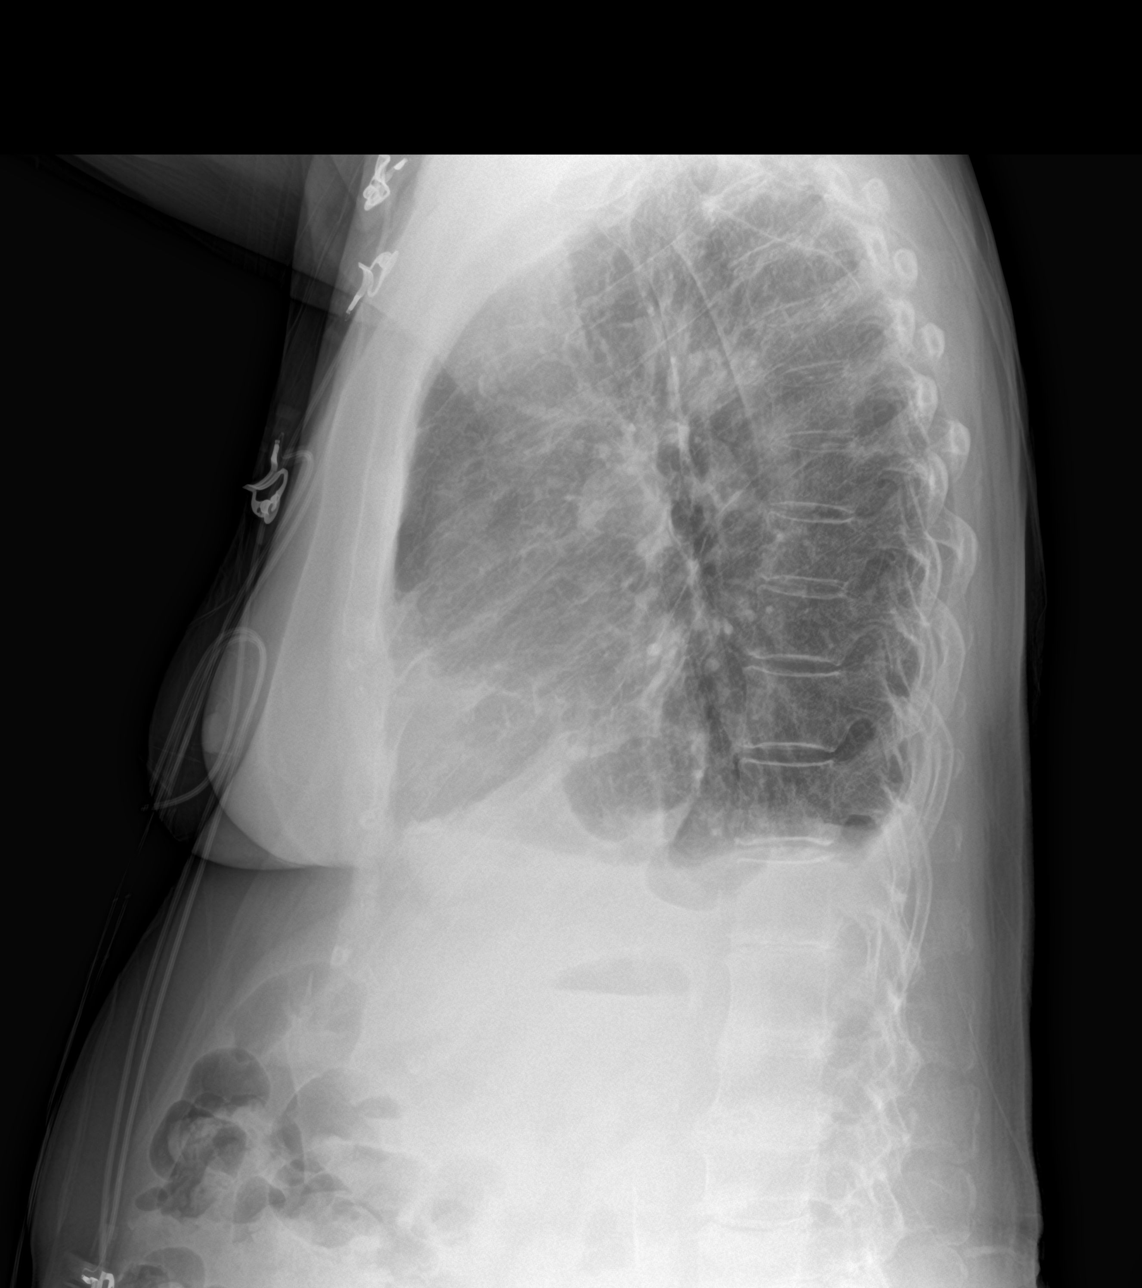

[chest pa (2 of 2)]
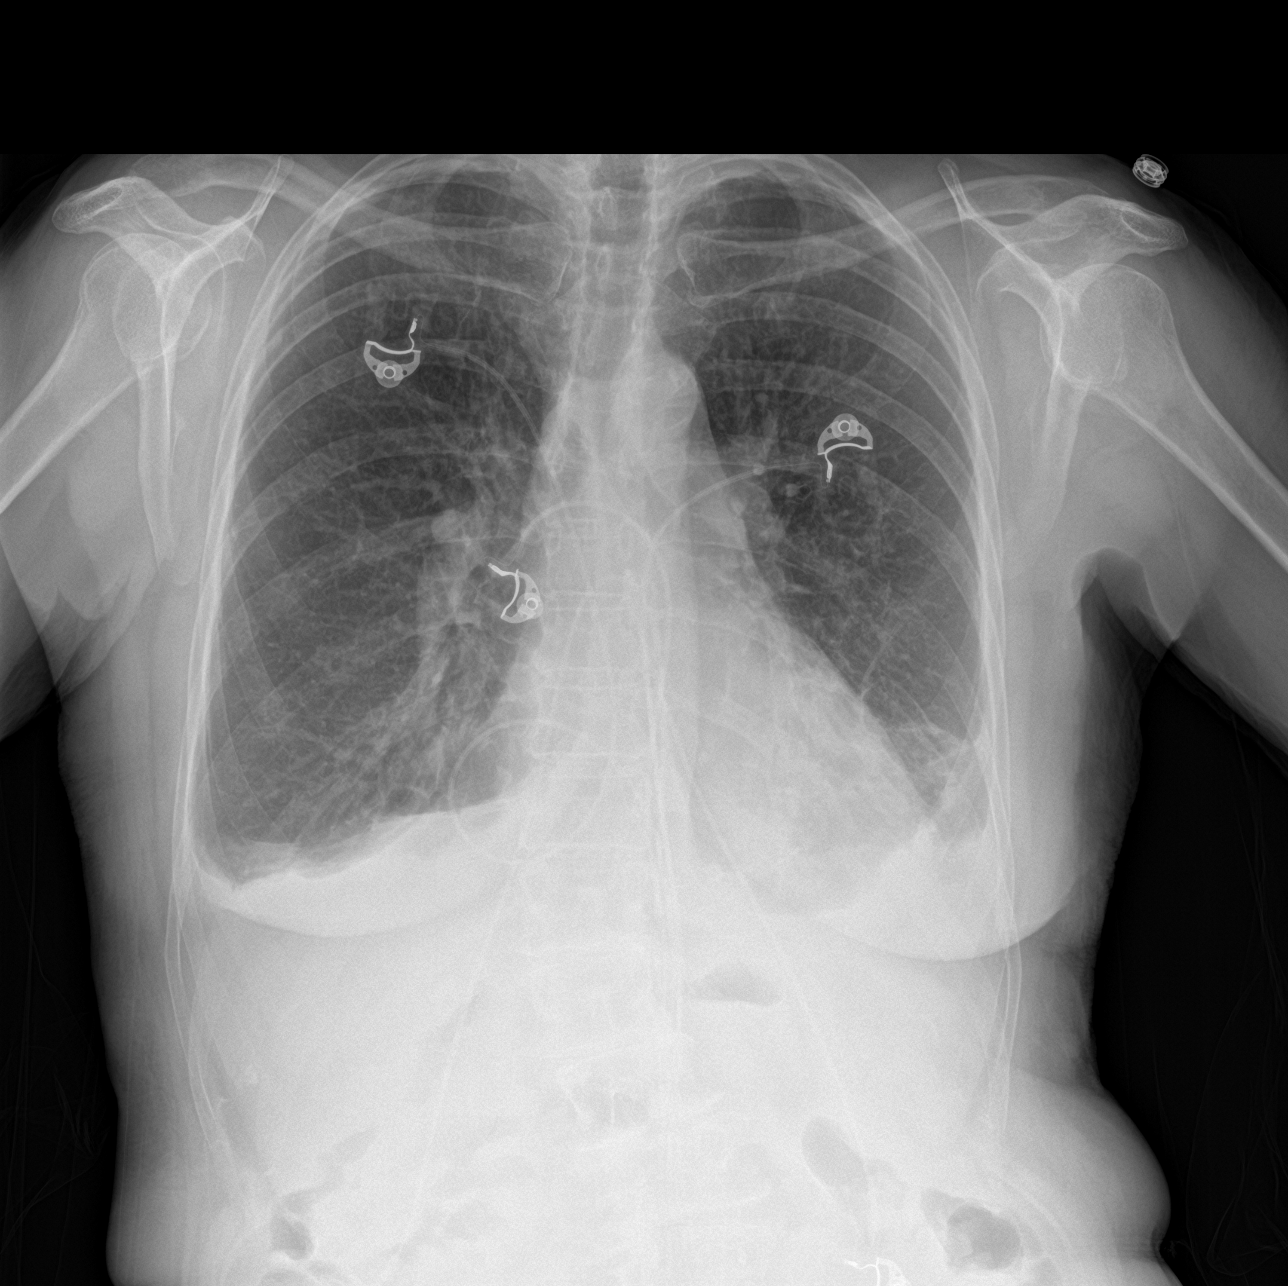

[3 of 3 positions shown; findings below may reference images not displayed]

FINDINGS: Small bilateral pleural effusions and bibasilar atelectasis versus
infiltrate. Overall improved aeration of the lungs with decreased in
the size of the pleural effusions compared to the radiograph of
01/26/2019. There is no pneumothorax. The cardiac silhouette is
within normal limits. Atherosclerotic calcification of the aortic
arch. No acute osseous pathology.
IMPRESSION: Small bilateral pleural effusions and bibasilar atelectasis versus
infiltrate. Overall improved aeration of the lungs compared to the
radiograph of 01/26/2019.

## 2021-05-17 DIAGNOSIS — R030 Elevated blood-pressure reading, without diagnosis of hypertension: Secondary | ICD-10-CM | POA: Diagnosis not present

## 2021-05-17 DIAGNOSIS — H9193 Unspecified hearing loss, bilateral: Secondary | ICD-10-CM | POA: Diagnosis not present

## 2021-05-17 DIAGNOSIS — H6123 Impacted cerumen, bilateral: Secondary | ICD-10-CM | POA: Diagnosis not present

## 2021-05-18 DIAGNOSIS — M25511 Pain in right shoulder: Secondary | ICD-10-CM | POA: Diagnosis not present

## 2021-05-31 ENCOUNTER — Encounter: Payer: Self-pay | Admitting: Cardiovascular Disease

## 2021-06-01 NOTE — Telephone Encounter (Signed)
Patient was on Valsartan/HCTZ 320/12.5 mg in 01/2019, but it was discontinued due to low BPs (see mychart message 02/15/2019). Will inform patient to follow up with her PCP about her BPs. ?

## 2021-08-21 DIAGNOSIS — I10 Essential (primary) hypertension: Secondary | ICD-10-CM | POA: Diagnosis not present

## 2021-08-21 DIAGNOSIS — I480 Paroxysmal atrial fibrillation: Secondary | ICD-10-CM | POA: Diagnosis not present

## 2021-08-21 DIAGNOSIS — D6869 Other thrombophilia: Secondary | ICD-10-CM | POA: Diagnosis not present

## 2021-08-21 DIAGNOSIS — E785 Hyperlipidemia, unspecified: Secondary | ICD-10-CM | POA: Diagnosis not present

## 2021-08-21 DIAGNOSIS — J449 Chronic obstructive pulmonary disease, unspecified: Secondary | ICD-10-CM | POA: Diagnosis not present

## 2021-08-29 ENCOUNTER — Other Ambulatory Visit: Payer: Self-pay | Admitting: Neurology

## 2021-11-17 DIAGNOSIS — Z1231 Encounter for screening mammogram for malignant neoplasm of breast: Secondary | ICD-10-CM | POA: Diagnosis not present

## 2021-12-18 NOTE — Progress Notes (Unsigned)
Office Visit    Brittany Oliver Name: Brittany Oliver Date of Encounter: 12/20/2021  Primary Care Provider:  [name], MD Primary Cardiologist:  Brittany Rouge, MD Primary Electrophysiologist: None  Chief Complaint    Brittany Oliver is a 81 y.o. female with PMH of HTN, HLD, CVA (1987), GI bleed, COPD, PAF who presents today for annual follow-up of hypertension and hyperlipidemia.   Past Medical History:  Diagnosis Date   Arthritis    Cancer ([place]) 02/2013   skin melanoma   Heart murmur    Hyperlipidemia    Hypertension    Neuralgia facialis vera    Stroke ([place]) 1987   has a little balance issue since stroke   Past Surgical History:  Procedure Laterality Date   ABDOMINAL HYSTERECTOMY  1974   pt still has her uterus, took only 1 ovary and 1 tube   APPENDECTOMY     COLONOSCOPY     DILATION AND CURETTAGE OF UTERUS  1965   ESOPHAGOGASTRODUODENOSCOPY (EGD) WITH PROPOFOL Left 01/23/2019   Procedure: ESOPHAGOGASTRODUODENOSCOPY (EGD) WITH PROPOFOL;  Surgeon: Brittany Silence, MD;  Location: [facility];  Service: Endoscopy;  Laterality: Left;   EXCISION MELANOMA WITH SENTINEL LYMPH NODE BIOPSY Left 03/27/2013   Procedure: EXCISION MELANOMA LEFT POSTERIOR ARM WITH LEFT AXILLARY SENTINEL LYMPH NODE BIOPSY;  Surgeon: Brittany Jarred, MD;  Location: [facility];  Service: General;  Laterality: Left;   EYE SURGERY Bilateral 2006   cataract and laser surgery   FINGER SURGERY Right    middle finger   MASS EXCISION Left 09/15/2013   Procedure: EXCISION MASS LEFT UPPER ARM;  Surgeon: Brittany Jarred, MD;  Location: [facility];  Service: General;  Laterality: Left;   TONSILLECTOMY      Allergies  Allergies  Allergen Reactions   Trileptal [Oxcarbazepine] Other (See Comments)    Low potassium    History of Present Illness    Brittany Oliver  is a 81 year old female with the above mention past medical history who presents today for annual follow-up of hypertension and hyperlipidemia.   She was initially seen in 2020 by Dr. Johnsie Oliver following atrial fibrillation with RVR resulting from GI bleed.  She was initially treated with flecainide and then switch to amiodarone with conversion to sinus rhythm.  She was subsequently taken off of amiodarone due to elevated TSH. CHA2DS2-VASc score was 6 and she was started on low-dose Xarelto.  Brittany Oliver elected to discontinue Xarelto and would like to restart only if recurrence of atrial fibrillation occurs.  She was last seen by Dr. Johnsie Oliver on 03/2020 for follow-up of atrial fibrillation.  During visit Brittany Oliver had no cardiac symptoms.  She is rate controlled with metoprolol and blood pressure was noted to be stable and Brittany Oliver was continued on valsartan/HCTZ.  Brittany Oliver presents today alone for annual follow-up.  Since last being seen in the office Brittany Oliver reports that she has noticed increased swelling lower extremities and her blood pressures have been fluctuating at home.  During today's visit blood pressure was initially 126/94 and was 138/82 on recheck.  She is also endorsing increased swelling that she has noticed over the past month.  The swelling is not associated with any increase shortness of breath however she does have shortness of breath at baseline due to COPD.  She does endorse some sodium indiscretions due to having family vacation last week.  She denies any chest pain or shortness of breath associated with increased swelling.  She notes that  she and her husband had COVID in June of this year and during that time her blood pressures were labile for short period.  Brittany Oliver denies chest pain, palpitations, dyspnea, PND, orthopnea, nausea, vomiting, dizziness, syncope, edema, weight gain, or early satiety.   Home Medications    Current Outpatient Medications  Medication Sig Dispense Refill   albuterol (VENTOLIN HFA) 108 (90 Base) MCG/ACT inhaler Inhale 2 puffs into the lungs every 6 (six) hours as needed for wheezing or shortness of  breath. 6.7 g 1   Baclofen 5 MG TABS Take 1 tablet three times daily as needed. 270 tablet 1   Calcium Carbonate-Vitamin D (CALCIUM 600/VITAMIN D) 600-400 MG-UNIT chew tablet Chew 1 tablet by mouth daily.     carvedilol (COREG) 3.125 MG tablet Take 1 tablet (3.125 mg total) by mouth 2 (two) times daily with a meal. 180 tablet 3   FLECTOR 1.3 % PTCH Place 1 patch onto the skin as needed.      Fluticasone-Umeclidin-Vilant 100-62.5-25 MCG/INH AEPB Inhale into the lungs.     furosemide (LASIX) 20 MG tablet Take one tablet by mouth for 3 days. 3 tablet 0   gabapentin (NEURONTIN) 300 MG capsule TAKE 1 CAPSULE 3 TIMES A   DAY 270 capsule 1   gabapentin (NEURONTIN) 600 MG tablet TAKE 1 TABLET 3 TIMES A DAY 270 tablet 1   polyvinyl alcohol (LIQUIFILM TEARS) 1.4 % ophthalmic solution Place 1 drop into both eyes as needed for dry eyes.     pravastatin (PRAVACHOL) 80 MG tablet Take 80 mg by mouth daily.     traMADol (ULTRAM) 50 MG tablet Take 50 mg by mouth every 6 (six) hours.     valsartan (DIOVAN) 160 MG tablet Take 160 mg by mouth daily.     vitamin C (ASCORBIC ACID) 500 MG tablet Take 500 mg by mouth daily.     No current facility-administered medications for this visit.     Review of Systems  Please see the history of present illness.    (+) Lower extremity swelling (+) Shortness of breath chronic  All other systems reviewed and are otherwise negative except as noted above.  Physical Exam    Wt Readings from Last 3 Encounters:  12/20/21 139 lb 3.2 oz (63.1 kg)  03/28/21 137 lb 9.6 oz (62.4 kg)  09/23/20 131 lb (59.4 kg)   VS: Vitals:   12/20/21 0839 12/20/21 0913  BP: (!) 126/94 138/82  Pulse: (!) 58   SpO2: 94%   ,Body mass index is 25.46 kg/m.  Constitutional:      Appearance: Healthy appearance. Not in distress.  Neck:     Vascular: JVD normal.  Pulmonary:     Effort: Pulmonary effort is normal.     Breath sounds: No wheezing. No rales. Diminished in the  bases Cardiovascular:     Normal rate. Regular rhythm. Normal S1. Normal S2.      Murmurs: There is no murmur.  Edema:    +1 bilateral pitting lower extremity edema Abdominal:     Palpations: Abdomen is soft non tender. There is no hepatomegaly.  Skin:    General: Skin is warm and dry.  Neurological:     General: No focal deficit present.     Mental Status: Alert and oriented to person, place and time.     Cranial Nerves: Cranial nerves are intact.  EKG/LABS/Other Studies Reviewed    ECG personally reviewed by me today -sinus bradycardia with rate of 58 bpm with  no acute changes noted and normal axis similar to previous EKG  Risk Assessment/Calculations:   This patients CHA2DS2-VASc Score and unadjusted Ischemic Stroke Rate (% per year) is equal to 9.7 % stroke rate/year from a score of 6  Above score calculated as 1 point each if present [CHF, HTN, DM, Vascular=MI/PAD/Aortic Plaque, Age if 65-74, or Female] Above score calculated as 2 points each if present [Age > 75, or Stroke/TIA/TE]   Lab Results  Component Value Date   WBC 5.3 01/27/2019   HGB 10.2 (L) 01/27/2019   HCT 31.5 (L) 01/27/2019   MCV 94.0 01/27/2019   PLT 208 01/27/2019   Lab Results  Component Value Date   CREATININE 0.91 03/26/2019   BUN 17 03/26/2019   NA 139 03/26/2019   K 4.1 03/26/2019   CL 103 03/26/2019   CO2 28 03/26/2019   Lab Results  Component Value Date   ALT 11 01/23/2019   AST 12 (L) 01/23/2019   ALKPHOS 33 (L) 01/23/2019   BILITOT 0.9 01/23/2019   No results found for: "CHOL", "HDL", "LDLCALC", "LDLDIRECT", "TRIG", "CHOLHDL"  No results found for: "HGBA1C"  Assessment & Plan    1.  Paroxysmal atrial fibrillation: -Brittany Oliver currently rate controlled with metoprolol -CHA2DS2-VASc score of 6 and currently not anticoagulated by choice and has elected not to resume -Today Brittany Oliver was sinus bradycardia with rate of 58 bpm by EKG. -She denies any recurrence of palpitations or  tachycardia.   2.  Hypertension: -Brittany Oliver's blood pressure today was initially elevated at 126/94 and was 138/82 on recheck -Continue we will change her metoprolol to carvedilol 3.125 mg twice daily and continue valsartan 160 mg daily  3.  Hyperlipidemia: -Cholesterol followed by PCP -Continue pravastatin 80 mg  4.  History of stroke: -Brittany Oliver has remote history of stroke from 1987  5.  Lower extremity edema: -Brittany Oliver endorses lower extremity edema  -We will evaluate valve and heart structure with 2D echo and to rule out any decreased LV function. -We will start Lasix 20 mg x 3 days and then continue Lasix as needed -BMET in 1 week  Disposition: Follow-up with Brittany Rouge, MD or APP in 1 month   Medication Adjustments/Labs and Tests Ordered: Current medicines are reviewed at length with the Brittany Oliver today.  Concerns regarding medicines are outlined above.   Signed, Mable Fill, Marissa Nestle, NP 12/20/2021, 9:16 AM [facility]  Note:  This document was prepared using Dragon voice recognition software and may include unintentional dictation errors.

## 2021-12-20 ENCOUNTER — Ambulatory Visit: Payer: Medicare Other | Attending: Nurse Practitioner | Admitting: Nurse Practitioner

## 2021-12-20 ENCOUNTER — Encounter: Payer: Self-pay | Admitting: Nurse Practitioner

## 2021-12-20 VITALS — BP 138/82 | HR 58 | Ht 62.0 in | Wt 139.2 lb

## 2021-12-20 DIAGNOSIS — R600 Localized edema: Secondary | ICD-10-CM | POA: Insufficient documentation

## 2021-12-20 DIAGNOSIS — E785 Hyperlipidemia, unspecified: Secondary | ICD-10-CM | POA: Diagnosis not present

## 2021-12-20 DIAGNOSIS — Z79899 Other long term (current) drug therapy: Secondary | ICD-10-CM | POA: Insufficient documentation

## 2021-12-20 DIAGNOSIS — I10 Essential (primary) hypertension: Secondary | ICD-10-CM | POA: Diagnosis not present

## 2021-12-20 DIAGNOSIS — I639 Cerebral infarction, unspecified: Secondary | ICD-10-CM | POA: Insufficient documentation

## 2021-12-20 DIAGNOSIS — I480 Paroxysmal atrial fibrillation: Secondary | ICD-10-CM | POA: Diagnosis not present

## 2021-12-20 LAB — BASIC METABOLIC PANEL
BUN/Creatinine Ratio: 13 (ref 12–28)
BUN: 9 mg/dL (ref 8–27)
CO2: 27 mmol/L (ref 20–29)
Calcium: 9.1 mg/dL (ref 8.7–10.3)
Chloride: 95 mmol/L — ABNORMAL LOW (ref 96–106)
Creatinine, Ser: 0.7 mg/dL (ref 0.57–1.00)
Glucose: 110 mg/dL — ABNORMAL HIGH (ref 70–99)
Potassium: 4.6 mmol/L (ref 3.5–5.2)
Sodium: 136 mmol/L (ref 134–144)
eGFR: 87 mL/min/{1.73_m2} (ref >59)

## 2021-12-20 MED ORDER — FUROSEMIDE 20 MG PO TABS: ORAL_TABLET | ORAL | 0 refills | Status: DC

## 2021-12-20 MED ORDER — FUROSEMIDE 20 MG PO TABS: ORAL_TABLET | ORAL | 3 refills | Status: AC

## 2021-12-20 MED ORDER — CARVEDILOL 3.125 MG PO TABS: 3.1250 mg | ORAL_TABLET | Freq: Two times a day (BID) | ORAL | 3 refills | Status: AC

## 2021-12-20 MED ORDER — CARVEDILOL 3.125 MG PO TABS: 3.1250 mg | ORAL_TABLET | Freq: Two times a day (BID) | ORAL | 3 refills | Status: DC

## 2021-12-20 NOTE — Patient Instructions (Signed)
Medication Instructions:  Stop Metoprolol  Start Coreg 3.125 mg twice a day  Take lasix 20 mg for 3 days   *If you need a refill on your cardiac medications before your next appointment, please call your pharmacy*   Lab Work: Bmet today   If you have labs (blood work) drawn today and your tests are completely normal, you will receive your results only by: [place] (if you have MyChart) OR A paper copy in the mail If you have any lab test that is abnormal or we need to change your treatment, we will call you to review the results.   Testing/Procedures: Your physician has requested that you have an echocardiogram. Echocardiography is a painless test that uses sound waves to create images of your heart. It provides your doctor with information about the size and shape of your heart and how well your heart's chambers and valves are working. This procedure takes approximately one hour. There are no restrictions for this procedure.    Follow-Up: At [facility], you and your health needs are our priority.  As part of our continuing mission to provide you with exceptional heart care, we have created designated Provider Care Teams.  These Care Teams include your primary Cardiologist (physician) and Advanced Practice Providers (APPs -  Physician Assistants and Nurse Practitioners) who all work together to provide you with the care you need, when you need it.  We recommend signing up for the patient portal called "MyChart".  Sign up information is provided on this After Visit Summary.  MyChart is used to connect with patients for Virtual Visits (Telemedicine).  Patients are able to view lab/test results, encounter notes, upcoming appointments, etc.  Non-urgent messages can be sent to your provider as well.   To learn more about what you can do with MyChart, go to [email or web address].    Your next appointment:   1 month(s)  The format for your next appointment:   In  Person  Provider:   Ambrose Pancoast, NP         Other Instructions  Check BP over the next 12 weeks and send/ call readings to [place]  Low-Sodium Eating Plan Sodium, which is an element that makes up salt, helps you maintain a healthy balance of fluids in your body. Too much sodium can increase your blood pressure and cause fluid and waste to be held in your body. Your health care provider or dietitian may recommend following this plan if you have high blood pressure (hypertension), kidney disease, liver disease, or heart failure. Eating less sodium can help lower your blood pressure, reduce swelling, and protect your heart, liver, and kidneys. What are tips for following this plan? Reading food labels The Nutrition Facts label lists the amount of sodium in one serving of the food. If you eat more than one serving, you must multiply the listed amount of sodium by the number of servings. Choose foods with less than 140 mg of sodium per serving. Avoid foods with 300 mg of sodium or more per serving. Shopping  Look for lower-sodium products, often labeled as "low-sodium" or "no salt added." Always check the sodium content, even if foods are labeled as "unsalted" or "no salt added." Buy fresh foods. Avoid canned foods and pre-made or frozen meals. Avoid canned, cured, or processed meats. Buy breads that have less than 80 mg of sodium per slice. Cooking  Eat more home-cooked food and less restaurant, buffet, and fast food. Avoid adding salt when cooking.  Use salt-free seasonings or herbs instead of table salt or sea salt. Check with your health care provider or pharmacist before using salt substitutes. Cook with plant-based oils, such as canola, sunflower, or olive oil. Meal planning When eating at a restaurant, ask that your food be prepared with less salt or no salt, if possible. Avoid dishes labeled as brined, pickled, cured, smoked, or made with soy sauce, miso, or teriyaki sauce. Avoid foods  that contain MSG (monosodium glutamate). MSG is sometimes added to [place] food, bouillon, and some canned foods. Make meals that can be grilled, baked, poached, roasted, or steamed. These are generally made with less sodium. General information Most people on this plan should limit their sodium intake to 1,500-2,000 mg (milligrams) of sodium each day. What foods should I eat? Fruits Fresh, frozen, or canned fruit. Fruit juice. Vegetables Fresh or frozen vegetables. "No salt added" canned vegetables. "No salt added" tomato sauce and paste. Low-sodium or reduced-sodium tomato and vegetable juice. Grains Low-sodium cereals, including oats, puffed wheat and rice, and shredded wheat. Low-sodium crackers. Unsalted rice. Unsalted pasta. Low-sodium bread. Whole-grain breads and whole-grain pasta. Meats and other proteins Fresh or frozen (no salt added) meat, poultry, seafood, and fish. Low-sodium canned tuna and salmon. Unsalted nuts. Dried peas, beans, and lentils without added salt. Unsalted canned beans. Eggs. Unsalted nut butters. Dairy Milk. Soy milk. Cheese that is naturally low in sodium, such as ricotta cheese, fresh mozzarella, or Swiss cheese. Low-sodium or reduced-sodium cheese. Cream cheese. Yogurt. Seasonings and condiments Fresh and dried herbs and spices. Salt-free seasonings. Low-sodium mustard and ketchup. Sodium-free salad dressing. Sodium-free light mayonnaise. Fresh or refrigerated horseradish. Lemon juice. Vinegar. Other foods Homemade, reduced-sodium, or low-sodium soups. Unsalted popcorn and pretzels. Low-salt or salt-free chips. The items listed above may not be a complete list of foods and beverages you can eat. Contact a dietitian for more information. What foods should I avoid? Vegetables Sauerkraut, pickled vegetables, and relishes. Olives. [place] fries. Onion rings. Regular canned vegetables (not low-sodium or reduced-sodium). Regular canned tomato sauce and paste (not  low-sodium or reduced-sodium). Regular tomato and vegetable juice (not low-sodium or reduced-sodium). Frozen vegetables in sauces. Grains Instant hot cereals. Bread stuffing, pancake, and biscuit mixes. Croutons. Seasoned rice or pasta mixes. Noodle soup cups. Boxed or frozen macaroni and cheese. Regular salted crackers. Self-rising flour. Meats and other proteins Meat or fish that is salted, canned, smoked, spiced, or pickled. Precooked or cured meat, such as sausages or meat loaves. Berniece Salines. Ham. Pepperoni. Hot dogs. Corned beef. Chipped beef. Salt pork. Jerky. Pickled herring. Anchovies and sardines. Regular canned tuna. Salted nuts. Dairy Processed cheese and cheese spreads. Hard cheeses. Cheese curds. Blue cheese. Feta cheese. String cheese. Regular cottage cheese. Buttermilk. Canned milk. Fats and oils Salted butter. Regular margarine. Ghee. Bacon fat. Seasonings and condiments Onion salt, garlic salt, seasoned salt, table salt, and sea salt. Canned and packaged gravies. Worcestershire sauce. Tartar sauce. Barbecue sauce. Teriyaki sauce. Soy sauce, including reduced-sodium. Steak sauce. Fish sauce. Oyster sauce. Cocktail sauce. Horseradish that you find on the shelf. Regular ketchup and mustard. Meat flavorings and tenderizers. Bouillon cubes. Hot sauce. Pre-made or packaged marinades. Pre-made or packaged taco seasonings. Relishes. Regular salad dressings. Salsa. Other foods Salted popcorn and pretzels. Corn chips and puffs. Potato and tortilla chips. Canned or dried soups. Pizza. Frozen entrees and pot pies. The items listed above may not be a complete list of foods and beverages you should avoid. Contact a dietitian for more information. Summary Eating less  sodium can help lower your blood pressure, reduce swelling, and protect your heart, liver, and kidneys. Most people on this plan should limit their sodium intake to 1,500-2,000 mg (milligrams) of sodium each day. Canned, boxed, and frozen  foods are high in sodium. Restaurant foods, fast foods, and pizza are also very high in sodium. You also get sodium by adding salt to food. Try to cook at home, eat more fresh fruits and vegetables, and eat less fast food and canned, processed, or prepared foods. This information is not intended to replace advice given to you by your health care provider. Make sure you discuss any questions you have with your health care provider. Document Revised: 04/10/2019 Document Reviewed: 02/04/2019 Elsevier Patient Education  [place]

## 2021-12-22 DIAGNOSIS — M85852 Other specified disorders of bone density and structure, left thigh: Secondary | ICD-10-CM | POA: Diagnosis not present

## 2021-12-22 DIAGNOSIS — M85851 Other specified disorders of bone density and structure, right thigh: Secondary | ICD-10-CM | POA: Diagnosis not present

## 2021-12-22 NOTE — Addendum Note (Signed)
Addended by: Christella Scheuermann C on: 12/22/2021 07:52 AM   Modules accepted: Orders

## 2021-12-26 DIAGNOSIS — Z23 Encounter for immunization: Secondary | ICD-10-CM | POA: Diagnosis not present

## 2022-01-02 ENCOUNTER — Ambulatory Visit (HOSPITAL_COMMUNITY): Payer: Medicare Other | Attending: Nurse Practitioner

## 2022-01-02 DIAGNOSIS — I480 Paroxysmal atrial fibrillation: Secondary | ICD-10-CM | POA: Diagnosis not present

## 2022-01-03 LAB — ECHOCARDIOGRAM COMPLETE
Area-P 1/2: 3.3 cm2
S' Lateral: 2.8 cm

## 2022-01-04 DIAGNOSIS — H40013 Open angle with borderline findings, low risk, bilateral: Secondary | ICD-10-CM | POA: Diagnosis not present

## 2022-01-04 DIAGNOSIS — H52203 Unspecified astigmatism, bilateral: Secondary | ICD-10-CM | POA: Diagnosis not present

## 2022-01-04 DIAGNOSIS — Z961 Presence of intraocular lens: Secondary | ICD-10-CM | POA: Diagnosis not present

## 2022-01-23 ENCOUNTER — Ambulatory Visit: Payer: Medicare Other | Admitting: Nurse Practitioner

## 2022-01-23 NOTE — Progress Notes (Unsigned)
Office Visit    Patient Name: Brittany Oliver Date of Encounter: 01/25/2022  Primary Care Provider:  [name], MD Primary Cardiologist:  Jenkins Rouge, MD Primary Electrophysiologist: None  Chief Complaint    ARIATNA JESTER is a 81 y.o. female with PMH of HTN, HLD, CVA (1987), GI bleed, COPD, PAF who presents today for annual follow-up of hypertension.  Past Medical History    Past Medical History:  Diagnosis Date   Arthritis    Cancer ([place]) 02/2013   skin melanoma   Heart murmur    Hyperlipidemia    Hypertension    Neuralgia facialis vera    Stroke ([place]) 1987   has a little balance issue since stroke   Past Surgical History:  Procedure Laterality Date   ABDOMINAL HYSTERECTOMY  1974   pt still has her uterus, took only 1 ovary and 1 tube   APPENDECTOMY     COLONOSCOPY     DILATION AND CURETTAGE OF UTERUS  1965   ESOPHAGOGASTRODUODENOSCOPY (EGD) WITH PROPOFOL Left 01/23/2019   Procedure: ESOPHAGOGASTRODUODENOSCOPY (EGD) WITH PROPOFOL;  Surgeon: Arta Silence, MD;  Location: [facility];  Service: Endoscopy;  Laterality: Left;   EXCISION MELANOMA WITH SENTINEL LYMPH NODE BIOPSY Left 03/27/2013   Procedure: EXCISION MELANOMA LEFT POSTERIOR ARM WITH LEFT AXILLARY SENTINEL LYMPH NODE BIOPSY;  Surgeon: Zenovia Jarred, MD;  Location: [place];  Service: General;  Laterality: Left;   EYE SURGERY Bilateral 2006   cataract and laser surgery   FINGER SURGERY Right    middle finger   MASS EXCISION Left 09/15/2013   Procedure: EXCISION MASS LEFT UPPER ARM;  Surgeon: Zenovia Jarred, MD;  Location: [place];  Service: General;  Laterality: Left;   TONSILLECTOMY      Allergies  Allergies  Allergen Reactions   Trileptal [Oxcarbazepine] Other (See Comments)    Low potassium    History of Present Illness    Brittany Oliver  is a 81 year old female with the above mention past medical history who presents today for annual follow-up of hypertension and  hyperlipidemia.  She was initially seen in 2020 by Dr. Johnsie Cancel following atrial fibrillation with RVR resulting from GI bleed.  She was initially treated with flecainide and then switch to amiodarone with conversion to sinus rhythm.  She was subsequently taken off of amiodarone due to elevated TSH. CHA2DS2-VASc score was 6 and she was started on low-dose Xarelto. Patient elected to discontinue Xarelto and would like to restart only if recurrence of atrial fibrillation occurs.   She was last seen by Dr. Johnsie Cancel on 03/2020 for follow-up of atrial fibrillation.  During visit patient had no cardiac symptoms.  She is rate controlled with metoprolol and blood pressure was noted to be stable and patient was continued on valsartan/HCTZ.  She was seen and follow-up on 12/20/2021 with complaint of increased lower extremity swelling.  Blood pressures were slightly elevated initially at 126/94 and reduced to 138/82 on recheck.  She was sent for 2D echo to rule out decreased LV function as cause to lower extremity swelling. 2D echo results showed no change from previous study in 2020.  She was also started on Lasix 20 mg as needed with 3-day dose of 20 mg daily.  Brittany Oliver presents today alone for follow-up.  Since last being seen in the office patient reports her blood pressures have been much improved since switching to carvedilol.  Blood pressure today was 130/70 and pressures at home have  been in the 809X to 833A systolically.  She is also tolerating her as needed Lasix and is euvolemic on exam today.  She denies any adverse reactions or side effects with her current medication regimen.  She is also abstaining from excess salt in her diet.  During her visit we reviewed her latest echo report and all questions were answered to her satisfaction.  Patient denies chest pain, palpitations, dyspnea, PND, orthopnea, nausea, vomiting, dizziness, syncope, edema, weight gain, or early satiety.  Home Medications    Current  Outpatient Medications  Medication Sig Dispense Refill   Baclofen 5 MG TABS Take 1 tablet three times daily as needed. 270 tablet 1   Calcium Carbonate-Vitamin D (CALCIUM 600/VITAMIN D) 600-400 MG-UNIT chew tablet Chew 1 tablet by mouth daily.     carvedilol (COREG) 3.125 MG tablet Take 1 tablet (3.125 mg total) by mouth 2 (two) times daily with a meal. 180 tablet 3   FLECTOR 1.3 % PTCH Place 1 patch onto the skin as needed.      Fluticasone-Umeclidin-Vilant 100-62.5-25 MCG/INH AEPB Inhale into the lungs.     furosemide (LASIX) 20 MG tablet Take one tablet by mouth for 3 days and as needed 30 tablet 3   gabapentin (NEURONTIN) 300 MG capsule TAKE 1 CAPSULE 3 TIMES A   DAY 270 capsule 1   gabapentin (NEURONTIN) 600 MG tablet TAKE 1 TABLET 3 TIMES A DAY 270 tablet 1   polyvinyl alcohol (LIQUIFILM TEARS) 1.4 % ophthalmic solution Place 1 drop into both eyes as needed for dry eyes.     pravastatin (PRAVACHOL) 80 MG tablet Take 80 mg by mouth daily.     traMADol (ULTRAM) 50 MG tablet Take 50 mg by mouth every 6 (six) hours.     valsartan (DIOVAN) 160 MG tablet Take 160 mg by mouth daily.     vitamin C (ASCORBIC ACID) 500 MG tablet Take 500 mg by mouth daily.     No current facility-administered medications for this visit.     Review of Systems  Please see the history of present illness.     All other systems reviewed and are otherwise negative except as noted above.  Physical Exam    Wt Readings from Last 3 Encounters:  01/25/22 129 lb (58.5 kg)  12/20/21 139 lb 3.2 oz (63.1 kg)  03/28/21 137 lb 9.6 oz (62.4 kg)   VS: Vitals:   01/25/22 1348  BP: 130/70  Pulse: 86  SpO2: 94%  ,Body mass index is 23.59 kg/m.  Constitutional:      Appearance: Healthy appearance. Not in distress.  Neck:     Vascular: JVD normal.  Pulmonary:     Effort: Pulmonary effort is normal.     Breath sounds: No wheezing. No rales. Diminished in the bases Cardiovascular:     Normal rate. Regular rhythm.  Normal S1. Normal S2.      Murmurs: There is no murmur.  Edema:    Peripheral edema absent.  Abdominal:     Palpations: Abdomen is soft non tender. There is no hepatomegaly.  Skin:    General: Skin is warm and dry.  Neurological:     General: No focal deficit present.     Mental Status: Alert and oriented to person, place and time.     Cranial Nerves: Cranial nerves are intact.  EKG/LABS/Other Studies Reviewed    ECG personally reviewed by me today -none completed today  Risk Assessment/Calculations:    CHA2DS2-VASc Score = 7  This indicates a 11.2% annual risk of stroke. The patient's score is based upon: CHF History: 0 HTN History: 1 Diabetes History: 0 Stroke History: 2 Vascular Disease History: 1 Age Score: 2 Gender Score: 1           Lab Results  Component Value Date   WBC 5.3 01/27/2019   HGB 10.2 (L) 01/27/2019   HCT 31.5 (L) 01/27/2019   MCV 94.0 01/27/2019   PLT 208 01/27/2019   Lab Results  Component Value Date   CREATININE 0.70 12/20/2021   BUN 9 12/20/2021   NA 136 12/20/2021   K 4.6 12/20/2021   CL 95 (L) 12/20/2021   CO2 27 12/20/2021   Lab Results  Component Value Date   ALT 11 01/23/2019   AST 12 (L) 01/23/2019   ALKPHOS 33 (L) 01/23/2019   BILITOT 0.9 01/23/2019   No results found for: "CHOL", "HDL", "LDLCALC", "LDLDIRECT", "TRIG", "CHOLHDL"  No results found for: "HGBA1C"  Assessment & Plan    1.  Paroxysmal atrial fibrillation: -Patient currently rate controlled with metoprolol -CHA2DS2-VASc score of 6 and currently not anticoagulated by choice and has elected not to resume -Today she is sinus rhythm based on auscultation.  2. Hypertension: -Patient's blood pressure today was well controlled today at 130/70 -Continue we will change her metoprolol to carvedilol 3.125 mg twice daily and continue valsartan 160 mg daily   3.  Hyperlipidemia: -Cholesterol followed by PCP -Continue pravastatin 80 mg   4.  History of  stroke:-Patient has remote history of stroke from 1987   5.  Lower extremity edema: -Patient endorses lower extremity edema  -We will evaluate valve and heart structure with 2D echo and to rule out any decreased LV function.  Disposition: Follow-up with Jenkins Rouge, MD or APP in 12 months    Medication Adjustments/Labs and Tests Ordered: Current medicines are reviewed at length with the patient today.  Concerns regarding medicines are outlined above.   Signed, Mable Fill, Marissa Nestle, NP 01/25/2022, 3:52 PM [facility]  Note:  This document was prepared using Dragon voice recognition software and may include unintentional dictation errors.

## 2022-01-25 ENCOUNTER — Encounter: Payer: Self-pay | Admitting: Nurse Practitioner

## 2022-01-25 ENCOUNTER — Ambulatory Visit: Payer: Medicare Other | Attending: Nurse Practitioner | Admitting: Nurse Practitioner

## 2022-01-25 VITALS — BP 130/70 | HR 86 | Ht 62.0 in | Wt 129.0 lb

## 2022-01-25 DIAGNOSIS — I480 Paroxysmal atrial fibrillation: Secondary | ICD-10-CM | POA: Diagnosis not present

## 2022-01-25 DIAGNOSIS — R600 Localized edema: Secondary | ICD-10-CM

## 2022-01-25 DIAGNOSIS — I639 Cerebral infarction, unspecified: Secondary | ICD-10-CM

## 2022-01-25 DIAGNOSIS — E785 Hyperlipidemia, unspecified: Secondary | ICD-10-CM | POA: Diagnosis not present

## 2022-01-25 NOTE — Patient Instructions (Signed)
Medication Instructions:  Your physician recommends that you continue on your current medications as directed. Please refer to the Current Medication list given to you today. *If you need a refill on your cardiac medications before your next appointment, please call your pharmacy*   Lab Work: None ordered   Testing/Procedures: None ordered   Follow-Up: At [facility], you and your health needs are our priority.  As part of our continuing mission to provide you with exceptional heart care, we have created designated Provider Care Teams.  These Care Teams include your primary Cardiologist (physician) and Advanced Practice Providers (APPs -  Physician Assistants and Nurse Practitioners) who all work together to provide you with the care you need, when you need it.  We recommend signing up for the patient portal called "MyChart".  Sign up information is provided on this After Visit Summary.  MyChart is used to connect with patients for Virtual Visits (Telemedicine).  Patients are able to view lab/test results, encounter notes, upcoming appointments, etc.  Non-urgent messages can be sent to your provider as well.   To learn more about what you can do with MyChart, go to [email or web address].    Your next appointment:   12 month(s)  The format for your next appointment:   In Person  Provider:   Jenkins Rouge, MD    Other Instructions   Important Information About Sugar

## 2022-01-29 ENCOUNTER — Other Ambulatory Visit (HOSPITAL_COMMUNITY): Payer: Self-pay | Admitting: Sports Medicine

## 2022-01-29 ENCOUNTER — Ambulatory Visit (HOSPITAL_COMMUNITY)
Admission: RE | Admit: 2022-01-29 | Discharge: 2022-01-29 | Disposition: A | Discharge status: Home / Self Care | Payer: Medicare Other | Source: Ambulatory Visit | Attending: Sports Medicine | Admitting: Sports Medicine

## 2022-01-29 DIAGNOSIS — M47816 Spondylosis without myelopathy or radiculopathy, lumbar region: Secondary | ICD-10-CM | POA: Diagnosis not present

## 2022-01-29 DIAGNOSIS — M545 Low back pain, unspecified: Secondary | ICD-10-CM | POA: Insufficient documentation

## 2022-01-30 ENCOUNTER — Other Ambulatory Visit: Payer: Self-pay | Admitting: Neurology

## 2022-02-21 DIAGNOSIS — J449 Chronic obstructive pulmonary disease, unspecified: Secondary | ICD-10-CM | POA: Diagnosis not present

## 2022-02-21 DIAGNOSIS — E785 Hyperlipidemia, unspecified: Secondary | ICD-10-CM | POA: Diagnosis not present

## 2022-02-21 DIAGNOSIS — I10 Essential (primary) hypertension: Secondary | ICD-10-CM | POA: Diagnosis not present

## 2022-02-21 DIAGNOSIS — Z23 Encounter for immunization: Secondary | ICD-10-CM | POA: Diagnosis not present

## 2022-02-21 DIAGNOSIS — G500 Trigeminal neuralgia: Secondary | ICD-10-CM | POA: Diagnosis not present

## 2022-02-21 DIAGNOSIS — I480 Paroxysmal atrial fibrillation: Secondary | ICD-10-CM | POA: Diagnosis not present

## 2022-02-21 DIAGNOSIS — D6869 Other thrombophilia: Secondary | ICD-10-CM | POA: Diagnosis not present

## 2022-03-27 NOTE — Progress Notes (Unsigned)
NEUROLOGY FOLLOW UP OFFICE NOTE  SHARDAY MICHL [id number]  Assessment/Plan:   Atypical left sided facial pain   Gabapentin '900mg'$  three times daily (takes '300mg'$  with '600mg'$  capsule with each dose) Baclofen '5mg'$  three times daily as needed for breakthrough pain. Follow up one year ***  Subjective:  Breckyn Castles is a 82 year old right-handed woman with hypertension, hypercholesterolemia, and history of posterior circulation stroke who follows up for atypical left-sided facial pain.   UPDATE:  Current medication: Gabapentin 900 mg ('300mg'$  cap + '600mg'$  cap) 3 times daily (6 hours apart starting at 7AM); baclofen '5mg'$  TID PRN (for breakthrough pain)  Facial pain is overall controlled.  ***   HISTORY:  She began experiencing left-sided facial pain and 2006.  She reports a fairly constant burning and pins and needles sensation in the left V1 and V2 distribution.  Light touch, cold and wind can trigger a shooting pain down the face, but there is no spontaneous paroxysmal shooting pain. Eating and brushing her teeth does not aggravate it.  She says heat seems to help relieve it.  She was eventually diagnosed with left trigeminal neuralgia in 2012.   Past medications:  Trileptal (caused hyponatremia) Other therapy:  Gamma Knife x2 (ineffective).   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   She does have a history of brainstem stroke in 1987, in which she presented with left oculomotor weakness, left rotatory nystagmus, left facial numbness and dysfunction of her right arm and leg.  She still has residual numbness of the left face.   In 2018, she endorsed balance problems and exhibited some symptoms of neuropathy in the feet.  She underwent work-up for neuropathy.  ANA, sed rate, B12, B6, TSH, SPEP and IFE were  unremarkable.  She was referred to physical therapy to help with balance.  It was minimally effective but may have not been as proactive in home exercises.  She has to think before turning around too fast.  No associated dizziness.  PAST MEDICAL HISTORY: Past Medical History:  Diagnosis Date   Arthritis    Cancer ([place]) 02/2013   skin melanoma   Heart murmur    Hyperlipidemia    Hypertension    Neuralgia facialis vera    Stroke [facility]) 1987   has a little balance issue since stroke    MEDICATIONS: Current Outpatient Medications on File Prior to Visit  Medication Sig Dispense Refill   Baclofen 5 MG TABS Take 1 tablet three times daily as needed. 270 tablet 1   Calcium Carbonate-Vitamin D (CALCIUM 600/VITAMIN D) 600-400 MG-UNIT chew tablet Chew 1 tablet by mouth daily.     carvedilol (COREG) 3.125 MG tablet Take 1 tablet (3.125 mg total) by mouth 2 (two) times daily with a meal. 180 tablet 3   FLECTOR 1.3 % PTCH Place 1 patch onto the skin as needed.      Fluticasone-Umeclidin-Vilant 100-62.5-25 MCG/INH AEPB Inhale into the lungs.     furosemide (LASIX) 20 MG tablet Take one tablet by mouth for 3 days and as needed 30 tablet 3   gabapentin (NEURONTIN) 300 MG capsule TAKE 1 CAPSULE 3 TIMES A   DAY 270 capsule 0   gabapentin (NEURONTIN) 600 MG tablet TAKE 1 TABLET 3 TIMES A DAY 270 tablet 0   polyvinyl alcohol (LIQUIFILM TEARS) 1.4 % ophthalmic solution Place 1 drop into  both eyes as needed for dry eyes.     pravastatin (PRAVACHOL) 80 MG tablet Take 80 mg by mouth daily.     traMADol (ULTRAM) 50 MG tablet Take 50 mg by mouth every 6 (six) hours.     valsartan (DIOVAN) 160 MG tablet Take 160 mg by mouth daily.     vitamin C (ASCORBIC ACID) 500 MG tablet Take 500 mg by mouth daily.     No current facility-administered medications on file prior to visit.    ALLERGIES: Allergies  Allergen Reactions   Trileptal [Oxcarbazepine] Other (See Comments)    Low potassium    FAMILY  HISTORY: Family History  Adopted: Yes  Problem Relation Age of Onset   Ataxia Neg Hx    Chorea Neg Hx    Dementia Neg Hx    Mental retardation Neg Hx    Migraines Neg Hx    Multiple sclerosis Neg Hx    Neurofibromatosis Neg Hx    Neuropathy Neg Hx    Parkinsonism Neg Hx    Seizures Neg Hx    Stroke Neg Hx       Objective:  *** General: No acute distress.  Patient appears well-groomed.   Head:  Normocephalic/atraumatic Eyes:  Fundi examined but not visualized Neck: supple, no paraspinal tenderness, full range of motion Heart:  Regular rate and rhythm Neurological Exam: ***   Metta Clines, DO  CC: [name], MD

## 2022-03-28 ENCOUNTER — Ambulatory Visit (INDEPENDENT_AMBULATORY_CARE_PROVIDER_SITE_OTHER): Payer: Medicare Other | Admitting: Neurology

## 2022-03-28 ENCOUNTER — Encounter: Payer: Self-pay | Admitting: Neurology

## 2022-03-28 VITALS — BP 173/84 | HR 77 | Ht 62.0 in | Wt 138.0 lb

## 2022-03-28 DIAGNOSIS — G501 Atypical facial pain: Secondary | ICD-10-CM | POA: Diagnosis not present

## 2022-03-28 MED ORDER — GABAPENTIN 600 MG PO TABS: 600.0000 mg | ORAL_TABLET | Freq: Three times a day (TID) | ORAL | 3 refills | Status: DC

## 2022-03-28 MED ORDER — GABAPENTIN 300 MG PO CAPS: 300.0000 mg | ORAL_CAPSULE | Freq: Three times a day (TID) | ORAL | 3 refills | Status: DC

## 2022-04-02 DIAGNOSIS — M25511 Pain in right shoulder: Secondary | ICD-10-CM | POA: Diagnosis not present

## 2022-04-16 DIAGNOSIS — D2271 Melanocytic nevi of right lower limb, including hip: Secondary | ICD-10-CM | POA: Diagnosis not present

## 2022-04-16 DIAGNOSIS — Z85828 Personal history of other malignant neoplasm of skin: Secondary | ICD-10-CM | POA: Diagnosis not present

## 2022-04-16 DIAGNOSIS — L570 Actinic keratosis: Secondary | ICD-10-CM | POA: Diagnosis not present

## 2022-04-16 DIAGNOSIS — L821 Other seborrheic keratosis: Secondary | ICD-10-CM | POA: Diagnosis not present

## 2022-04-16 DIAGNOSIS — D225 Melanocytic nevi of trunk: Secondary | ICD-10-CM | POA: Diagnosis not present

## 2022-04-16 DIAGNOSIS — L918 Other hypertrophic disorders of the skin: Secondary | ICD-10-CM | POA: Diagnosis not present

## 2022-04-16 DIAGNOSIS — Z85820 Personal history of malignant melanoma of skin: Secondary | ICD-10-CM | POA: Diagnosis not present

## 2022-08-26 ENCOUNTER — Encounter: Payer: Self-pay | Admitting: Neurology

## 2022-08-27 ENCOUNTER — Other Ambulatory Visit: Payer: Self-pay | Admitting: Neurology

## 2022-08-27 DIAGNOSIS — D6869 Other thrombophilia: Secondary | ICD-10-CM | POA: Diagnosis not present

## 2022-08-27 DIAGNOSIS — I480 Paroxysmal atrial fibrillation: Secondary | ICD-10-CM | POA: Diagnosis not present

## 2022-08-27 DIAGNOSIS — J449 Chronic obstructive pulmonary disease, unspecified: Secondary | ICD-10-CM | POA: Diagnosis not present

## 2022-08-27 DIAGNOSIS — I10 Essential (primary) hypertension: Secondary | ICD-10-CM | POA: Diagnosis not present

## 2022-08-27 DIAGNOSIS — M8588 Other specified disorders of bone density and structure, other site: Secondary | ICD-10-CM | POA: Diagnosis not present

## 2022-08-27 DIAGNOSIS — E785 Hyperlipidemia, unspecified: Secondary | ICD-10-CM | POA: Diagnosis not present

## 2022-08-27 MED ORDER — BACLOFEN 5 MG PO TABS: ORAL_TABLET | ORAL | 3 refills | Status: DC

## 2022-10-01 ENCOUNTER — Encounter: Payer: Self-pay | Admitting: Neurology

## 2022-11-14 DIAGNOSIS — Z23 Encounter for immunization: Secondary | ICD-10-CM | POA: Diagnosis not present

## 2022-12-04 DIAGNOSIS — Z1231 Encounter for screening mammogram for malignant neoplasm of breast: Secondary | ICD-10-CM | POA: Diagnosis not present

## 2022-12-18 DIAGNOSIS — N390 Urinary tract infection, site not specified: Secondary | ICD-10-CM | POA: Diagnosis not present

## 2022-12-18 DIAGNOSIS — R399 Unspecified symptoms and signs involving the genitourinary system: Secondary | ICD-10-CM | POA: Diagnosis not present

## 2022-12-31 DIAGNOSIS — N390 Urinary tract infection, site not specified: Secondary | ICD-10-CM | POA: Diagnosis not present

## 2022-12-31 DIAGNOSIS — R399 Unspecified symptoms and signs involving the genitourinary system: Secondary | ICD-10-CM | POA: Diagnosis not present

## 2023-01-09 DIAGNOSIS — H52203 Unspecified astigmatism, bilateral: Secondary | ICD-10-CM | POA: Diagnosis not present

## 2023-01-09 DIAGNOSIS — Z961 Presence of intraocular lens: Secondary | ICD-10-CM | POA: Diagnosis not present

## 2023-01-09 DIAGNOSIS — H40013 Open angle with borderline findings, low risk, bilateral: Secondary | ICD-10-CM | POA: Diagnosis not present

## 2023-02-25 DIAGNOSIS — D6869 Other thrombophilia: Secondary | ICD-10-CM | POA: Diagnosis not present

## 2023-02-25 DIAGNOSIS — I480 Paroxysmal atrial fibrillation: Secondary | ICD-10-CM | POA: Diagnosis not present

## 2023-02-25 DIAGNOSIS — Z23 Encounter for immunization: Secondary | ICD-10-CM | POA: Diagnosis not present

## 2023-02-25 DIAGNOSIS — E785 Hyperlipidemia, unspecified: Secondary | ICD-10-CM | POA: Diagnosis not present

## 2023-02-25 DIAGNOSIS — J449 Chronic obstructive pulmonary disease, unspecified: Secondary | ICD-10-CM | POA: Diagnosis not present

## 2023-02-25 DIAGNOSIS — M5136 Other intervertebral disc degeneration, lumbar region with discogenic back pain only: Secondary | ICD-10-CM | POA: Diagnosis not present

## 2023-02-25 DIAGNOSIS — I10 Essential (primary) hypertension: Secondary | ICD-10-CM | POA: Diagnosis not present

## 2023-03-27 ENCOUNTER — Ambulatory Visit: Payer: Medicare Other | Admitting: Neurology

## 2023-04-01 ENCOUNTER — Other Ambulatory Visit: Payer: Self-pay | Admitting: Neurology

## 2023-04-11 ENCOUNTER — Ambulatory Visit: Payer: Medicare Other | Admitting: Neurology

## 2023-04-18 NOTE — Progress Notes (Signed)
NEUROLOGY FOLLOW UP OFFICE NOTE  VASHON ARCH [id number]  Assessment/Plan:   Atypical facial pain    Gabapentin 900mg  three times daily (takes 300mg  with 600mg  capsule with each dose).  Will request most recent labs (kidney function) from PCP's office Baclofen to 10mg  three times daily as needed for breakthrough pain. Follow up with PCP regarding blood pressure Follow up one year   Subjective:  Brittany Oliver is a 83 year old right-handed woman with hypertension, hypercholesterolemia, and history of posterior circulation stroke who follows up for atypical left-sided facial pain.   UPDATE:  Current medication: Gabapentin 900 mg (300mg  cap + 600mg  cap) 3 times daily; baclofen 5mg  TID PRN (for breakthrough pain)  Facial pain is overall controlled. May have a breakthrough pain once in awhile.     HISTORY:  She began experiencing left-sided facial pain and 2006.  She reports a fairly constant burning and pins and needles sensation in the left V1 and V2 distribution.  Light touch, cold and wind can trigger a shooting pain down the face, but there is no spontaneous paroxysmal shooting pain. Eating and brushing her teeth does not aggravate it.  She says heat seems to help relieve it.  She was eventually diagnosed with left trigeminal neuralgia in 2012.   Past medications:  Trileptal (caused hyponatremia) Other therapy:  Gamma Knife x2 (ineffective).   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   She does have a history of brainstem stroke in 1987, in which she presented with left oculomotor weakness, left rotatory nystagmus, left facial numbness and dysfunction of her right arm and leg.  She still has residual numbness of the left face.   In 2018, she endorsed balance problems and exhibited some symptoms of  neuropathy in the feet.  She underwent work-up for neuropathy.  ANA, sed rate, B12, B6, TSH, SPEP and IFE were unremarkable.  She was referred to physical therapy to help with balance.  It was minimally effective but may have not been as proactive in home exercises.  She has to think before turning around too fast.  No associated dizziness.  PAST MEDICAL HISTORY: Past Medical History:  Diagnosis Date   Arthritis    Cancer (HCC) 02/2013   skin melanoma   Heart murmur    Hyperlipidemia    Hypertension    Neuralgia facialis vera    Stroke (HCC) 1987   has a little balance issue since stroke    MEDICATIONS: Current Outpatient Medications on File Prior to Visit  Medication Sig Dispense Refill   Baclofen 5 MG TABS Take 2 tablets once daily 180 tablet 3   Calcium Carbonate-Vitamin D (CALCIUM 600/VITAMIN D) 600-400 MG-UNIT chew tablet Chew 1 tablet by mouth daily.     carvedilol (COREG) 3.125 MG tablet Take 1 tablet (3.125 mg total) by mouth 2 (two) times daily with a meal. 180 tablet 3   FLECTOR 1.3 % PTCH Place 1 patch onto the skin as needed.      Fluticasone-Umeclidin-Vilant 100-62.5-25 MCG/INH AEPB Inhale into the lungs.     furosemide (LASIX) 20 MG tablet Take one tablet by mouth for 3 days and as needed 30 tablet 3   gabapentin (NEURONTIN) 300 MG capsule TAKE 1 CAPSULE 3 TIMES A   DAY 270 capsule 0   gabapentin (NEURONTIN) 600 MG tablet TAKE 1 TABLET 3 TIMES A DAY  270 tablet 0   polyvinyl alcohol (LIQUIFILM TEARS) 1.4 % ophthalmic solution Place 1 drop into both eyes as needed for dry eyes.     pravastatin (PRAVACHOL) 80 MG tablet Take 80 mg by mouth daily.     traMADol (ULTRAM) 50 MG tablet Take 50 mg by mouth every 6 (six) hours.     valsartan (DIOVAN) 160 MG tablet Take 160 mg by mouth daily.     vitamin C (ASCORBIC ACID) 500 MG tablet Take 500 mg by mouth daily.     No current facility-administered medications on file prior to visit.    ALLERGIES: Allergies  Allergen  Reactions   Trileptal [Oxcarbazepine] Other (See Comments)    Low potassium    FAMILY HISTORY: Family History  Adopted: Yes  Problem Relation Age of Onset   Ataxia Neg Hx    Chorea Neg Hx    Dementia Neg Hx    Mental retardation Neg Hx    Migraines Neg Hx    Multiple sclerosis Neg Hx    Neurofibromatosis Neg Hx    Neuropathy Neg Hx    Parkinsonism Neg Hx    Seizures Neg Hx    Stroke Neg Hx       Objective:  Blood pressure (!) 143/110, pulse 93, height 5\' 2"  (1.575 m), weight 140 lb (63.5 kg), SpO2 92%. General: No acute distress.  Patient appears well-groomed.   Head:  Normocephalic/atraumatic Eyes:  Fundi examined but not visualized Neck: supple, no paraspinal tenderness, full range of motion Heart:  Regular rate and rhythm Neurological Exam: alert and oriented.  Speech fluent and not dysarthric, language intact.  CN II-XII intact. Bulk and tone normal, muscle strength 5/5 throughout.  Sensation to light touch intact.  Deep tendon reflexes 1+ throughout.  Finger to nose testing intact.  Wide-based gait.   Romberg with sway.   Brittany Millet, DO  CC: Brittany Montana, MD

## 2023-04-19 ENCOUNTER — Ambulatory Visit (INDEPENDENT_AMBULATORY_CARE_PROVIDER_SITE_OTHER): Payer: Medicare Other | Admitting: Neurology

## 2023-04-19 ENCOUNTER — Encounter: Payer: Self-pay | Admitting: Neurology

## 2023-04-19 VITALS — BP 143/110 | HR 93 | Ht 62.0 in | Wt 140.0 lb

## 2023-04-19 DIAGNOSIS — I10 Essential (primary) hypertension: Secondary | ICD-10-CM

## 2023-04-19 DIAGNOSIS — G501 Atypical facial pain: Secondary | ICD-10-CM | POA: Diagnosis not present

## 2023-06-05 DIAGNOSIS — L570 Actinic keratosis: Secondary | ICD-10-CM | POA: Diagnosis not present

## 2023-06-05 DIAGNOSIS — L821 Other seborrheic keratosis: Secondary | ICD-10-CM | POA: Diagnosis not present

## 2023-06-05 DIAGNOSIS — L720 Epidermal cyst: Secondary | ICD-10-CM | POA: Diagnosis not present

## 2023-06-05 DIAGNOSIS — D1801 Hemangioma of skin and subcutaneous tissue: Secondary | ICD-10-CM | POA: Diagnosis not present

## 2023-06-05 DIAGNOSIS — L814 Other melanin hyperpigmentation: Secondary | ICD-10-CM | POA: Diagnosis not present

## 2023-06-05 DIAGNOSIS — Z85820 Personal history of malignant melanoma of skin: Secondary | ICD-10-CM | POA: Diagnosis not present

## 2023-06-05 DIAGNOSIS — L738 Other specified follicular disorders: Secondary | ICD-10-CM | POA: Diagnosis not present

## 2023-06-05 DIAGNOSIS — Z85828 Personal history of other malignant neoplasm of skin: Secondary | ICD-10-CM | POA: Diagnosis not present

## 2023-06-22 ENCOUNTER — Other Ambulatory Visit: Payer: Self-pay | Admitting: Neurology

## 2023-07-03 DIAGNOSIS — M47896 Other spondylosis, lumbar region: Secondary | ICD-10-CM | POA: Diagnosis not present

## 2023-07-03 DIAGNOSIS — M25511 Pain in right shoulder: Secondary | ICD-10-CM | POA: Diagnosis not present

## 2023-08-18 ENCOUNTER — Telehealth: Payer: Self-pay | Admitting: Neurology

## 2023-08-19 NOTE — Telephone Encounter (Signed)
 Pt called in to let Sheena know she has enough gabapentin  to last until her PCP visit next week. She will have them do labs and send them to us .

## 2023-08-19 NOTE — Telephone Encounter (Signed)
 Patient will make sure per [facility] note, would like to request liver and kidney function tests from PCP . The PCP will get the test done.

## 2023-08-22 ENCOUNTER — Ambulatory Visit (HOSPITAL_BASED_OUTPATIENT_CLINIC_OR_DEPARTMENT_OTHER): Attending: Sports Medicine | Admitting: Physical Therapy

## 2023-08-22 ENCOUNTER — Other Ambulatory Visit: Payer: Self-pay

## 2023-08-22 ENCOUNTER — Encounter (HOSPITAL_BASED_OUTPATIENT_CLINIC_OR_DEPARTMENT_OTHER): Payer: Self-pay | Admitting: Physical Therapy

## 2023-08-22 DIAGNOSIS — M5459 Other low back pain: Secondary | ICD-10-CM | POA: Insufficient documentation

## 2023-08-22 DIAGNOSIS — M6281 Muscle weakness (generalized): Secondary | ICD-10-CM | POA: Insufficient documentation

## 2023-08-22 DIAGNOSIS — R2681 Unsteadiness on feet: Secondary | ICD-10-CM | POA: Insufficient documentation

## 2023-08-22 DIAGNOSIS — M47896 Other spondylosis, lumbar region: Secondary | ICD-10-CM | POA: Diagnosis not present

## 2023-08-22 DIAGNOSIS — R2689 Other abnormalities of gait and mobility: Secondary | ICD-10-CM | POA: Insufficient documentation

## 2023-08-22 NOTE — Therapy (Signed)
 OUTPATIENT PHYSICAL THERAPY THORACOLUMBAR EVALUATION   Patient Name: Brittany Oliver MRN: [id number] DOB:[birth year], 83 y.o., female Today's Date: 08/22/2023  END OF SESSION:  PT End of Session - 08/22/23 1319     Visit Number 1    Number of Visits 16    Date for PT Re-Evaluation 11/01/23    Authorization Type mcr    Progress Note Due on Visit 10    PT Start Time 1101    PT Stop Time 1140    PT Time Calculation (min) 39 min    Activity Tolerance Patient tolerated treatment well    Behavior During Therapy [facility] for tasks assessed/performed             Past Medical History:  Diagnosis Date   Arthritis    Cancer (HCC) 02/2013   skin melanoma   Heart murmur    Hyperlipidemia    Hypertension    Neuralgia facialis vera    Stroke (HCC) 1987   has a little balance issue since stroke   Past Surgical History:  Procedure Laterality Date   ABDOMINAL HYSTERECTOMY  1974   pt still has her uterus, took only 1 ovary and 1 tube   APPENDECTOMY     COLONOSCOPY     DILATION AND CURETTAGE OF UTERUS  1965   ESOPHAGOGASTRODUODENOSCOPY (EGD) WITH PROPOFOL  Left 01/23/2019   Procedure: ESOPHAGOGASTRODUODENOSCOPY (EGD) WITH PROPOFOL ;  Surgeon: Evangeline Hilts, MD;  Location: [facility];  Service: Endoscopy;  Laterality: Left;   EXCISION MELANOMA WITH SENTINEL LYMPH NODE BIOPSY Left 03/27/2013   Procedure: EXCISION MELANOMA LEFT POSTERIOR ARM WITH LEFT AXILLARY SENTINEL LYMPH NODE BIOPSY;  Surgeon: Cloyce Darby, MD;  Location: [facility] OR;  Service: General;  Laterality: Left;   EYE SURGERY Bilateral 2006   cataract and laser surgery   FINGER SURGERY Right    middle finger   MASS EXCISION Left 09/15/2013   Procedure: EXCISION MASS LEFT UPPER ARM;  Surgeon: Cloyce Darby, MD;  Location: [facility] OR;  Service: General;  Laterality: Left;   TONSILLECTOMY     Patient Active Problem List   Diagnosis Date Noted   Secondary hypercoagulable state (HCC) 03/26/2019   Paroxysmal atrial fibrillation  (HCC)    GI bleed 01/22/2019   Hypertension    Hyperlipidemia    Neuralgia facialis vera    COPD mixed type (HCC) 03/02/2015   Dyspnea 12/23/2014   Ex-cigarette smoker 12/23/2014   Seroma complicating a procedure 09/24/2013   Mass of arm 08/26/2013   Atypical facial pain 07/13/2013   Melanoma of left upper arm (HCC) 03/23/2013   Stroke (HCC) 1987   Anemia due to acute blood loss 1987    PCP: Victorio Grave MD  REFERRING PROVIDER: Frances Ingles MD  REFERRING DIAG: [phone] (ICD-10-CM) - Other spondylosis, lumbar region   Rationale for Evaluation and Treatment: Rehabilitation  THERAPY DIAG:  Other low back pain  Muscle weakness (generalized)  Other abnormalities of gait and mobility  Unsteadiness on feet  ONSET DATE: exacerbation 1 yr  SUBJECTIVE:  SUBJECTIVE STATEMENT: Exercises about 2 x week walking out door.  My strength and balance are my 2 things I want to improve.  When I was 45 I had a brain stem stroke completely recovered except minor balance issue mostly when I am tired. Rotator nystagmus residual but MD says my brain has overrode it.  PERTINENT HISTORY:  Convex left scoliosis centered at L3  A-fib COPD  PAIN:  Are you having pain? Yes: NPRS scale: current 3/10; worst 7/10; least 0-1/10 Pain location: low thoracic upper lumbar spine Pain description: ache Aggravating factors: standing 15 minutes Relieving factors: sitting  PRECAUTIONS: None  RED FLAGS: None   WEIGHT BEARING RESTRICTIONS: No  FALLS:  Has patient fallen in last 6 months? No  LIVING ENVIRONMENT: Lives with: lives with their spouse Lives in: House/apartment Stairs: Yes: Internal: 16 steps; on right going up main floor with bed and bath 1st floor Has following equipment at home: None  OCCUPATION:  retired  PLOF: Independent  PATIENT GOALS: improve my strength and my balance  NEXT MD VISIT: as needed  OBJECTIVE:  Note: Objective measures were completed at Evaluation unless otherwise noted.  DIAGNOSTIC FINDINGS:  MRI Lumbar 11/23 IMPRESSION: 1. Compared with previous MRI from 2019, no acute findings or clear explanation for the patient's symptoms. 2. Chronic multilevel spondylosis associated with a convex left scoliosis, similar to previous study from 2019. 3. Chronic foraminal narrowing on the right at L3-4, bilaterally at L4-5 and on the left at L5-S1. No high-grade spinal stenosis or acute nerve root encroachment.  PATIENT SURVEYS:  Modified Oswestry 14/50=28%   COGNITION: Overall cognitive status: Within functional limits for tasks assessed     SENSATION: WFL  MUSCLE LENGTH: Hamstrings: wfl tested in sitting   POSTURE: rounded shoulders, forward head, increased thoracic kyphosis, and left pelvic obliquity Convex left scoliosis centered at L3   PALPATION: Mild TTP upper lumbar and lower thoracic paraspinals  LUMBAR ROM:   AROM eval  Flexion FT to mid calf P!  Extension Limited 25%  Right lateral flexion full  Left lateral flexion full  Right rotation   Left rotation    (Blank rows = not tested)  LOWER EXTREMITY ROM:     wfl  LOWER EXTREMITY MMT:    MMT Right eval Left eval  Hip flexion 31.1 32.0  Hip extension    Hip abduction 28.5 21.8  Hip adduction    Hip internal rotation    Hip external rotation    Knee flexion    Knee extension 29.7 35.1  Ankle dorsiflexion    Ankle plantarflexion    Ankle inversion    Ankle eversion     (Blank rows = not tested)  LUMBAR SPECIAL TESTS:  Slump test: Negative  FUNCTIONAL TESTS:  5 times sit to stand: from bench 24.45 using  Timed up and go (TUG): 13.24  BERG Balance Test          Date:   Sit to Stand 2  Standing unsupported 4  Sitting with back unsupported but feet supported 4  Stand to  sit  2  Transfers  3  Standing unsupported with eyes closed 3  Standing unsupported feet together 1  From standing position, reach forward with outstretched arm 3  From standing position, pick up object from floor 3  From standing position, turn and look behind over each shoulder 3  Turn 360 1  Standing unsupported, alternately place foot on step 1  Standing unsupported, one foot in front 1  Standing on one leg 0  Total:  28    GAIT: Distance walked: 500 ft Assistive device utilized: None Level of assistance: Complete Independence Comments: shorted step length, decreased cadence  TREATMENT  Eval Self care:Posture and Optometrist handout and instruction                                                                                                                       PATIENT EDUCATION:  Education details: Discussed eval findings, rehab rationale, aquatic program progression/POC and pools in area. Patient is in agreement  Person educated: Patient Education method: Explanation Education comprehension: verbalized understanding  HOME EXERCISE PROGRAM: TBA  ASSESSMENT:  CLINICAL IMPRESSION: Patient is a 83 y.o. f who was seen today for physical therapy evaluation and treatment for LBP due to lumbar spondylosis. It is also noted she has a Convex left scoliosis centered at L3 .  She is an active senior and reports reduction in mobility recently partly due to back pain and fear of falling.  Exam demonstrates she has bilat hip weakness, core weakness and as per Randye Buttner balance is a high fall risk. Her pain level increases in LB with extended times standing/walking.  It will reduce to 0/10 with rest.  No interruptions in sleep. Pt will benefit from skilled physical therapy to improve all areas of deficits which are effecting her functional mobility and decreasing safety with ADL's.  Plan to initiate in aquatic then transition to land when strength is gained, balance is improved and pain  is reduced.  OBJECTIVE IMPAIRMENTS: Abnormal gait, decreased activity tolerance, decreased balance, decreased endurance, decreased mobility, difficulty walking, decreased strength, postural dysfunction, and pain.   ACTIVITY LIMITATIONS: carrying, lifting, bending, standing, squatting, sleeping, stairs, transfers, locomotion level, and caring for others  PARTICIPATION LIMITATIONS: meal prep, cleaning, laundry, shopping, community activity, and yard work  PERSONAL FACTORS: Past/current experiences, Time since onset of injury/illness/exacerbation, and 1 comorbidity: see PmHx are also affecting patient's functional outcome.   REHAB POTENTIAL: Good  CLINICAL DECISION MAKING: Evolving/moderate complexity  EVALUATION COMPLEXITY: Moderate   GOALS: Goals reviewed with patient? Yes  SHORT TERM GOALS: Target date: 09/26/23  Pt will tolerate full aquatic sessions consistently without increase in pain and with improving function to demonstrate good toleration and effectiveness of intervention.  Baseline: Goal status: INITIAL  2.  Pt will tolerate walking to and from setting and engaging in aquatic therapy session without excessive fatigue or increase in pain to demonstrate improved toleration to activity. Baseline:  Goal status: INITIAL  3.  Pt will perform tandem and SLS in 3.6 ft holding for up towards 15s to demonstrate improvements in balance ability Baseline: unable to complete on "land" Goal status: INITIAL    LONG TERM GOALS: Target date: 11/03/23  Pt to improve on ODI by 15% (MCID) to demonstrate statistically significant Improvement in function. Baseline: 14/50=28% Goal status: INITIAL  2.  Pt will be indep with final HEP's (land and aquatic as appropriate) for continued management of condition  Baseline:  Goal status: INITIAL  3.  Pt will report decrease in pain by at least 50% for improved toleration to activity/quality of life and to demonstrate improved management of  pain. Baseline:  Goal status: INITIAL  4.  Pt will report standing toleration up towards 30 minutes to prepare a meal or do dishes without limiting pain Baseline: 15 min Max Goal status: INITIAL  5.  Pt will improve on Berg balance test to >/= 43/56  (MDC 35/56) to demonstrate a decrease in fall risk. Baseline: 28/56 Goal status: INITIAL  6.  Pt will improve strength in hips by at least 10 lbs (HD) to demonstrate improved overall physical function Baseline: see chart Goal status: INITIAL  PLAN:  PT FREQUENCY: 2x/week  PT DURATION: 10 weeks 16 visits  PLANNED INTERVENTIONS: 97164- PT Re-evaluation, 97110-Therapeutic exercises, 97530- Therapeutic activity, 97112- Neuromuscular re-education, 97535- Self Care, 96045- Manual therapy, [id number]- Gait training, [phone]- Aquatic Therapy, [phone]- Electrical stimulation (unattended), [phone]- Ionotophoresis 4mg /ml Dexamethasone, Patient/Family education, Balance training, Stair training, Taping, Joint mobilization, DME instructions, Cryotherapy, and Moist heat.  PLAN FOR NEXT SESSION: Aquatic: le and core strengthening; balance and proprioception retraining, gait toleration   Lucinda Saber) Jomaira Darr MPT 08/22/23 1:21 PM [facility] [address] [place], [place], [address] Phone: [phone]   Fax:  [phone]

## 2023-08-26 DIAGNOSIS — R7303 Prediabetes: Secondary | ICD-10-CM | POA: Diagnosis not present

## 2023-08-26 DIAGNOSIS — I10 Essential (primary) hypertension: Secondary | ICD-10-CM | POA: Diagnosis not present

## 2023-08-26 DIAGNOSIS — E559 Vitamin D deficiency, unspecified: Secondary | ICD-10-CM | POA: Diagnosis not present

## 2023-08-26 DIAGNOSIS — G500 Trigeminal neuralgia: Secondary | ICD-10-CM | POA: Diagnosis not present

## 2023-08-26 DIAGNOSIS — J449 Chronic obstructive pulmonary disease, unspecified: Secondary | ICD-10-CM | POA: Diagnosis not present

## 2023-08-26 DIAGNOSIS — M5136 Other intervertebral disc degeneration, lumbar region with discogenic back pain only: Secondary | ICD-10-CM | POA: Diagnosis not present

## 2023-08-26 DIAGNOSIS — E785 Hyperlipidemia, unspecified: Secondary | ICD-10-CM | POA: Diagnosis not present

## 2023-08-26 DIAGNOSIS — I480 Paroxysmal atrial fibrillation: Secondary | ICD-10-CM | POA: Diagnosis not present

## 2023-08-28 DIAGNOSIS — M79672 Pain in left foot: Secondary | ICD-10-CM | POA: Diagnosis not present

## 2023-08-28 DIAGNOSIS — S9031XA Contusion of right foot, initial encounter: Secondary | ICD-10-CM | POA: Diagnosis not present

## 2023-08-31 ENCOUNTER — Encounter: Payer: Self-pay | Admitting: Neurology

## 2023-09-03 ENCOUNTER — Telehealth: Payer: Self-pay | Admitting: Neurology

## 2023-09-03 NOTE — Telephone Encounter (Signed)
 Received labs from PCP.  Gabapentin  refilled

## 2023-09-03 NOTE — Telephone Encounter (Signed)
 Patient advised.

## 2023-09-03 NOTE — Telephone Encounter (Signed)
 Patient advised labs received.   Gabapentin  orders approved.

## 2023-09-04 ENCOUNTER — Encounter (HOSPITAL_BASED_OUTPATIENT_CLINIC_OR_DEPARTMENT_OTHER): Payer: Self-pay | Admitting: Physical Therapy

## 2023-09-04 ENCOUNTER — Ambulatory Visit (HOSPITAL_BASED_OUTPATIENT_CLINIC_OR_DEPARTMENT_OTHER): Admitting: Physical Therapy

## 2023-09-04 DIAGNOSIS — M5459 Other low back pain: Secondary | ICD-10-CM

## 2023-09-04 DIAGNOSIS — R2689 Other abnormalities of gait and mobility: Secondary | ICD-10-CM | POA: Diagnosis not present

## 2023-09-04 DIAGNOSIS — R2681 Unsteadiness on feet: Secondary | ICD-10-CM | POA: Diagnosis not present

## 2023-09-04 DIAGNOSIS — M6281 Muscle weakness (generalized): Secondary | ICD-10-CM | POA: Diagnosis not present

## 2023-09-04 DIAGNOSIS — M47896 Other spondylosis, lumbar region: Secondary | ICD-10-CM | POA: Diagnosis not present

## 2023-09-04 NOTE — Therapy (Signed)
 OUTPATIENT PHYSICAL THERAPY THORACOLUMBAR EVALUATION   Patient Name: Brittany Oliver MRN: [id number] DOB:[birth year], 83 y.o., female Today's Date: 09/04/2023  END OF SESSION:  PT End of Session - 09/04/23 1605     Visit Number 2    Number of Visits 16    Date for PT Re-Evaluation 11/01/23    Authorization Type mcr    Progress Note Due on Visit 10    PT Start Time 1616    PT Stop Time 1633   cut short due to weather   PT Time Calculation (min) 17 min    Activity Tolerance Patient tolerated treatment well    Behavior During Therapy [facility] for tasks assessed/performed          Past Medical History:  Diagnosis Date   Arthritis    Cancer (HCC) 02/2013   skin melanoma   Heart murmur    Hyperlipidemia    Hypertension    Neuralgia facialis vera    Stroke (HCC) 1987   has a little balance issue since stroke   Past Surgical History:  Procedure Laterality Date   ABDOMINAL HYSTERECTOMY  1974   pt still has her uterus, took only 1 ovary and 1 tube   APPENDECTOMY     COLONOSCOPY     DILATION AND CURETTAGE OF UTERUS  1965   ESOPHAGOGASTRODUODENOSCOPY (EGD) WITH PROPOFOL  Left 01/23/2019   Procedure: ESOPHAGOGASTRODUODENOSCOPY (EGD) WITH PROPOFOL ;  Surgeon: Evangeline Hilts, MD;  Location: [facility];  Service: Endoscopy;  Laterality: Left;   EXCISION MELANOMA WITH SENTINEL LYMPH NODE BIOPSY Left 03/27/2013   Procedure: EXCISION MELANOMA LEFT POSTERIOR ARM WITH LEFT AXILLARY SENTINEL LYMPH NODE BIOPSY;  Surgeon: Cloyce Darby, MD;  Location: [facility] OR;  Service: General;  Laterality: Left;   EYE SURGERY Bilateral 2006   cataract and laser surgery   FINGER SURGERY Right    middle finger   MASS EXCISION Left 09/15/2013   Procedure: EXCISION MASS LEFT UPPER ARM;  Surgeon: Cloyce Darby, MD;  Location: [facility] OR;  Service: General;  Laterality: Left;   TONSILLECTOMY     Patient Active Problem List   Diagnosis Date Noted   Secondary hypercoagulable state (HCC) 03/26/2019   Paroxysmal  atrial fibrillation (HCC)    GI bleed 01/22/2019   Hypertension    Hyperlipidemia    Neuralgia facialis vera    COPD mixed type (HCC) 03/02/2015   Dyspnea 12/23/2014   Ex-cigarette smoker 12/23/2014   Seroma complicating a procedure 09/24/2013   Mass of arm 08/26/2013   Atypical facial pain 07/13/2013   Melanoma of left upper arm (HCC) 03/23/2013   Stroke (HCC) 1987   Anemia due to acute blood loss 1987    PCP: Victorio Grave MD  REFERRING PROVIDER: Frances Ingles MD  REFERRING DIAG: [phone] (ICD-10-CM) - Other spondylosis, lumbar region   Rationale for Evaluation and Treatment: Rehabilitation  THERAPY DIAG:  Other low back pain  Muscle weakness (generalized)  Other abnormalities of gait and mobility  Unsteadiness on feet  ONSET DATE: exacerbation 1 yr  SUBJECTIVE:  SUBJECTIVE STATEMENT: Hit my left foot on a stump last week and bruised my foot bad.  It is better now. LBP is low today   Initial Subjective Exercises about 2 x week walking out door.  My strength and balance are my 2 things I want to improve.  When I was 45 I had a brain stem stroke completely recovered except minor balance issue mostly when I am tired. Rotator nystagmus residual but MD says my brain has overrode it.  PERTINENT HISTORY:  Convex left scoliosis centered at L3  A-fib COPD  PAIN:  Are you having pain? Yes: NPRS scale: current 2/10; worst 7/10; least 0-1/10 Pain location: low thoracic upper lumbar spine Pain description: ache Aggravating factors: standing 15 minutes Relieving factors: sitting  PRECAUTIONS: None  RED FLAGS: None   WEIGHT BEARING RESTRICTIONS: No  FALLS:  Has patient fallen in last 6 months? No  LIVING ENVIRONMENT: Lives with: lives with their spouse Lives in:  House/apartment Stairs: Yes: Internal: 16 steps; on right going up main floor with bed and bath 1st floor Has following equipment at home: None  OCCUPATION: retired  PLOF: Independent  PATIENT GOALS: improve my strength and my balance  NEXT MD VISIT: as needed  OBJECTIVE:  Note: Objective measures were completed at Evaluation unless otherwise noted.  DIAGNOSTIC FINDINGS:  MRI Lumbar 11/23 IMPRESSION: 1. Compared with previous MRI from 2019, no acute findings or clear explanation for the patient's symptoms. 2. Chronic multilevel spondylosis associated with a convex left scoliosis, similar to previous study from 2019. 3. Chronic foraminal narrowing on the right at L3-4, bilaterally at L4-5 and on the left at L5-S1. No high-grade spinal stenosis or acute nerve root encroachment.  PATIENT SURVEYS:  Modified Oswestry 14/50=28%   COGNITION: Overall cognitive status: Within functional limits for tasks assessed     SENSATION: WFL  MUSCLE LENGTH: Hamstrings: wfl tested in sitting   POSTURE: rounded shoulders, forward head, increased thoracic kyphosis, and left pelvic obliquity Convex left scoliosis centered at L3   PALPATION: Mild TTP upper lumbar and lower thoracic paraspinals  LUMBAR ROM:   AROM eval  Flexion FT to mid calf P!  Extension Limited 25%  Right lateral flexion full  Left lateral flexion full  Right rotation   Left rotation    (Blank rows = not tested)  LOWER EXTREMITY ROM:     wfl  LOWER EXTREMITY MMT:    MMT Right eval Left eval  Hip flexion 31.1 32.0  Hip extension    Hip abduction 28.5 21.8  Hip adduction    Hip internal rotation    Hip external rotation    Knee flexion    Knee extension 29.7 35.1  Ankle dorsiflexion    Ankle plantarflexion    Ankle inversion    Ankle eversion     (Blank rows = not tested)  LUMBAR SPECIAL TESTS:  Slump test: Negative  FUNCTIONAL TESTS:  5 times sit to stand: from bench 24.45 using  Timed up  and go (TUG): 13.24  BERG Balance Test          Date:   Sit to Stand 2  Standing unsupported 4  Sitting with back unsupported but feet supported 4  Stand to sit  2  Transfers  3  Standing unsupported with eyes closed 3  Standing unsupported feet together 1  From standing position, reach forward with outstretched arm 3  From standing position, pick up object from floor 3  From standing position, turn and  look behind over each shoulder 3  Turn 360 1  Standing unsupported, alternately place foot on step 1  Standing unsupported, one foot in front 1  Standing on one leg 0  Total:  28    GAIT: Distance walked: 500 ft Assistive device utilized: None Level of assistance: Complete Independence Comments: shorted step length, decreased cadence  TREATMENT  [facility] Adult PT Treatment:                                                DATE: 09/04/23 Pt seen for aquatic therapy today.  Treatment took place in water 3.5-4.75 ft in depth at the [facility] pool. Temp of water was 91.  Pt entered/exited the pool via stairs using step to pattern with hand rail.  *Intro to setting *walking forward, back and side stepping in 3.6 ft with ue support of barbell->unsupported *side stepping with RBHB ue add/abd.  Cues for slowed pacing and touching feet together for balance challenge. *HB Carry RBHB forward and back.  Instruction on abdominal bracing for core activation.-> bilaterally then  unilaterally. Cues for maintaining a direct path  Pt requires the buoyancy and hydrostatic pressure of water for support, and to offload joints by unweighting joint load by at least 50 % in navel deep water and by at least 75-80% in chest to neck deep water.  Viscosity of the water is needed for resistance of strengthening. Water current perturbations provides challenge to standing balance requiring increased core activation.                                                                                                                           PATIENT EDUCATION:  Education details: Discussed eval findings, rehab rationale, aquatic program progression/POC and pools in area. Patient is in agreement  Person educated: Patient Education method: Explanation Education comprehension: verbalized understanding  HOME EXERCISE PROGRAM: TBA  ASSESSMENT:  CLINICAL IMPRESSION: Session cut short due to lightening Pt demonstrates safety and independence in aquatic setting with therapist instructing from deck. She is confident in setting, moving throughout all depths easily.  Pt given cues for core engagement and is cued throughout session to maintain position.  She does have some difficulty moving into position with feet together due to balance deficit but does not have any LOB.  Requires multiple cues for slowing pace. She is a good candidate for aquatic intervention and will benefit from the properties of water to progress towards functional goals.     Initial Impression Patient is a 83 y.o. f who was seen today for physical therapy evaluation and treatment for LBP due to lumbar spondylosis. It is also noted she has a Convex left scoliosis centered at L3 .  She is an active senior and reports reduction in mobility recently partly due to back pain and fear of falling.  Exam demonstrates she has bilat hip weakness, core weakness and as per Randye Buttner balance is a high fall risk. Her pain level increases in LB with extended times standing/walking.  It will reduce to 0/10 with rest.  No interruptions in sleep. Pt will benefit from skilled physical therapy to improve all areas of deficits which are effecting her functional mobility and decreasing safety with ADL's.  Plan to initiate in aquatic then transition to land when strength is gained, balance is improved and pain is reduced.  OBJECTIVE IMPAIRMENTS: Abnormal gait, decreased activity tolerance, decreased balance, decreased endurance, decreased mobility, difficulty  walking, decreased strength, postural dysfunction, and pain.   ACTIVITY LIMITATIONS: carrying, lifting, bending, standing, squatting, sleeping, stairs, transfers, locomotion level, and caring for others  PARTICIPATION LIMITATIONS: meal prep, cleaning, laundry, shopping, community activity, and yard work  PERSONAL FACTORS: Past/current experiences, Time since onset of injury/illness/exacerbation, and 1 comorbidity: see PmHx are also affecting patient's functional outcome.   REHAB POTENTIAL: Good  CLINICAL DECISION MAKING: Evolving/moderate complexity  EVALUATION COMPLEXITY: Moderate   GOALS: Goals reviewed with patient? Yes  SHORT TERM GOALS: Target date: 09/26/23  Pt will tolerate full aquatic sessions consistently without increase in pain and with improving function to demonstrate good toleration and effectiveness of intervention.  Baseline: Goal status: INITIAL  2.  Pt will tolerate walking to and from setting and engaging in aquatic therapy session without excessive fatigue or increase in pain to demonstrate improved toleration to activity. Baseline:  Goal status: INITIAL  3.  Pt will perform tandem and SLS in 3.6 ft holding for up towards 15s to demonstrate improvements in balance ability Baseline: unable to complete on land Goal status: INITIAL    LONG TERM GOALS: Target date: 11/03/23  Pt to improve on ODI by 15% ([facility]) to demonstrate statistically significant Improvement in function. Baseline: 14/50=28% Goal status: INITIAL  2.  Pt will be indep with final HEP's (land and aquatic as appropriate) for continued management of condition Baseline:  Goal status: INITIAL  3.  Pt will report decrease in pain by at least 50% for improved toleration to activity/quality of life and to demonstrate improved management of pain. Baseline:  Goal status: INITIAL  4.  Pt will report standing toleration up towards 30 minutes to prepare a meal or do dishes without limiting  pain Baseline: 15 min Max Goal status: INITIAL  5.  Pt will improve on Berg balance test to >/= 43/56  (MDC 35/56) to demonstrate a decrease in fall risk. Baseline: 28/56 Goal status: INITIAL  6.  Pt will improve strength in hips by at least 10 lbs (HD) to demonstrate improved overall physical function Baseline: see chart Goal status: INITIAL  PLAN:  PT FREQUENCY: 2x/week  PT DURATION: 10 weeks 16 visits  PLANNED INTERVENTIONS: 97164- PT Re-evaluation, 97110-Therapeutic exercises, 97530- Therapeutic activity, 97112- Neuromuscular re-education, 97535- Self Care, [id number]- Manual therapy, [id number]- Gait training, [phone]- Aquatic Therapy, [phone]- Electrical stimulation (unattended), [phone]- Ionotophoresis 4mg /ml Dexamethasone, Patient/Family education, Balance training, Stair training, Taping, Joint mobilization, DME instructions, Cryotherapy, and Moist heat.  PLAN FOR NEXT SESSION: Aquatic: le and core strengthening; balance and proprioception retraining, gait toleration   Lucinda Saber) Davy Faught MPT 09/04/23 4:44 PM [facility] [address] [place], [place], [address] Phone: [phone]   Fax:  [phone]

## 2023-09-10 ENCOUNTER — Encounter (HOSPITAL_BASED_OUTPATIENT_CLINIC_OR_DEPARTMENT_OTHER): Payer: Self-pay | Admitting: Physical Therapy

## 2023-09-10 ENCOUNTER — Ambulatory Visit (HOSPITAL_BASED_OUTPATIENT_CLINIC_OR_DEPARTMENT_OTHER): Payer: Self-pay | Admitting: Physical Therapy

## 2023-09-10 DIAGNOSIS — M6281 Muscle weakness (generalized): Secondary | ICD-10-CM | POA: Diagnosis not present

## 2023-09-10 DIAGNOSIS — M5459 Other low back pain: Secondary | ICD-10-CM

## 2023-09-10 DIAGNOSIS — M47896 Other spondylosis, lumbar region: Secondary | ICD-10-CM | POA: Diagnosis not present

## 2023-09-10 DIAGNOSIS — R2681 Unsteadiness on feet: Secondary | ICD-10-CM | POA: Diagnosis not present

## 2023-09-10 DIAGNOSIS — R2689 Other abnormalities of gait and mobility: Secondary | ICD-10-CM

## 2023-09-10 MED ORDER — GABAPENTIN 600 MG PO TABS
600.0000 mg | ORAL_TABLET | Freq: Three times a day (TID) | ORAL | 0 refills | Status: DC
Start: 2023-09-10 — End: 2023-11-20

## 2023-09-10 MED ORDER — GABAPENTIN 300 MG PO CAPS
300.0000 mg | ORAL_CAPSULE | Freq: Three times a day (TID) | ORAL | 1 refills | Status: DC
Start: 2023-09-10 — End: 2024-01-27

## 2023-09-10 NOTE — Telephone Encounter (Signed)
 Per patient [employer], Hi, Brittany Oliver, Sorry to be a pest but [facility] does not have my gabapentin  prescription in process. I haven't received an email and online just shows that there are no refills. Can you fax to them again or follow up to see what happened? I usually receive notice within 24 hours that they are working on it.

## 2023-09-10 NOTE — Addendum Note (Signed)
 Addended by: Renardo Cheatum J on: 09/10/2023 09:48 AM   Modules accepted: Orders

## 2023-09-10 NOTE — Therapy (Signed)
 OUTPATIENT PHYSICAL THERAPY THORACOLUMBAR EVALUATION   Patient Name: Brittany Oliver MRN: [id number] DOB:[birth year], 83 y.o., female Today's Date: 09/10/2023  END OF SESSION:  PT End of Session - 09/10/23 1020     Visit Number 3    Number of Visits 16    Date for PT Re-Evaluation 11/01/23    Authorization Type mcr    Progress Note Due on Visit 10    PT Start Time 1019    PT Stop Time 1100    PT Time Calculation (min) 41 min    Activity Tolerance Patient tolerated treatment well    Behavior During Therapy [facility] for tasks assessed/performed          Past Medical History:  Diagnosis Date   Arthritis    Cancer (HCC) 02/2013   skin melanoma   Heart murmur    Hyperlipidemia    Hypertension    Neuralgia facialis vera    Stroke (HCC) 1987   has a little balance issue since stroke   Past Surgical History:  Procedure Laterality Date   ABDOMINAL HYSTERECTOMY  1974   pt still has her uterus, took only 1 ovary and 1 tube   APPENDECTOMY     COLONOSCOPY     DILATION AND CURETTAGE OF UTERUS  1965   ESOPHAGOGASTRODUODENOSCOPY (EGD) WITH PROPOFOL  Left 01/23/2019   Procedure: ESOPHAGOGASTRODUODENOSCOPY (EGD) WITH PROPOFOL ;  Surgeon: Burnette Fallow, MD;  Location: [facility];  Service: Endoscopy;  Laterality: Left;   EXCISION MELANOMA WITH SENTINEL LYMPH NODE BIOPSY Left 03/27/2013   Procedure: EXCISION MELANOMA LEFT POSTERIOR ARM WITH LEFT AXILLARY SENTINEL LYMPH NODE BIOPSY;  Surgeon: Dann FORBES Hummer, MD;  Location: [facility] OR;  Service: General;  Laterality: Left;   EYE SURGERY Bilateral 2006   cataract and laser surgery   FINGER SURGERY Right    middle finger   MASS EXCISION Left 09/15/2013   Procedure: EXCISION MASS LEFT UPPER ARM;  Surgeon: Dann FORBES Hummer, MD;  Location: [facility] OR;  Service: General;  Laterality: Left;   TONSILLECTOMY     Patient Active Problem List   Diagnosis Date Noted   Secondary hypercoagulable state (HCC) 03/26/2019   Paroxysmal atrial fibrillation (HCC)     GI bleed 01/22/2019   Hypertension    Hyperlipidemia    Neuralgia facialis vera    COPD mixed type (HCC) 03/02/2015   Dyspnea 12/23/2014   Ex-cigarette smoker 12/23/2014   Seroma complicating a procedure 09/24/2013   Mass of arm 08/26/2013   Atypical facial pain 07/13/2013   Melanoma of left upper arm (HCC) 03/23/2013   Stroke (HCC) 1987   Anemia due to acute blood loss 1987    PCP: Montie Pizza MD  REFERRING PROVIDER: Juliene Bailey MD  REFERRING DIAG: [phone] (ICD-10-CM) - Other spondylosis, lumbar region   Rationale for Evaluation and Treatment: Rehabilitation  THERAPY DIAG:  Other low back pain  Muscle weakness (generalized)  Other abnormalities of gait and mobility  ONSET DATE: exacerbation 1 yr  SUBJECTIVE:  SUBJECTIVE STATEMENT: Had a busy weekend and my talked to me a little bit.  Used pain patch and rested and it was better.   Initial Subjective Exercises about 2 x week walking out door.  My strength and balance are my 2 things I want to improve.  When I was 45 I had a brain stem stroke completely recovered except minor balance issue mostly when I am tired. Rotator nystagmus residual but MD says my brain has overrode it.  PERTINENT HISTORY:  Convex left scoliosis centered at L3  A-fib COPD  PAIN:  Are you having pain? Yes: NPRS scale: current 2/10; worst 7/10; least 0-1/10 Pain location: low thoracic upper lumbar spine Pain description: ache Aggravating factors: standing 15 minutes Relieving factors: sitting  PRECAUTIONS: None  RED FLAGS: None   WEIGHT BEARING RESTRICTIONS: No  FALLS:  Has patient fallen in last 6 months? No  LIVING ENVIRONMENT: Lives with: lives with their spouse Lives in: House/apartment Stairs: Yes: Internal: 16 steps; on right going up  main floor with bed and bath 1st floor Has following equipment at home: None  OCCUPATION: retired  PLOF: Independent  PATIENT GOALS: improve my strength and my balance  NEXT MD VISIT: as needed  OBJECTIVE:  Note: Objective measures were completed at Evaluation unless otherwise noted.  DIAGNOSTIC FINDINGS:  MRI Lumbar 11/23 IMPRESSION: 1. Compared with previous MRI from 2019, no acute findings or clear explanation for the patient's symptoms. 2. Chronic multilevel spondylosis associated with a convex left scoliosis, similar to previous study from 2019. 3. Chronic foraminal narrowing on the right at L3-4, bilaterally at L4-5 and on the left at L5-S1. No high-grade spinal stenosis or acute nerve root encroachment.  PATIENT SURVEYS:  Modified Oswestry 14/50=28%   COGNITION: Overall cognitive status: Within functional limits for tasks assessed     SENSATION: WFL  MUSCLE LENGTH: Hamstrings: wfl tested in sitting   POSTURE: rounded shoulders, forward head, increased thoracic kyphosis, and left pelvic obliquity Convex left scoliosis centered at L3   PALPATION: Mild TTP upper lumbar and lower thoracic paraspinals  LUMBAR ROM:   AROM eval  Flexion FT to mid calf P!  Extension Limited 25%  Right lateral flexion full  Left lateral flexion full  Right rotation   Left rotation    (Blank rows = not tested)  LOWER EXTREMITY ROM:     wfl  LOWER EXTREMITY MMT:    MMT Right eval Left eval  Hip flexion 31.1 32.0  Hip extension    Hip abduction 28.5 21.8  Hip adduction    Hip internal rotation    Hip external rotation    Knee flexion    Knee extension 29.7 35.1  Ankle dorsiflexion    Ankle plantarflexion    Ankle inversion    Ankle eversion     (Blank rows = not tested)  LUMBAR SPECIAL TESTS:  Slump test: Negative  FUNCTIONAL TESTS:  5 times sit to stand: from bench 24.45 using  Timed up and go (TUG): 13.24  BERG Balance Test          Date:   Sit to  Stand 2  Standing unsupported 4  Sitting with back unsupported but feet supported 4  Stand to sit  2  Transfers  3  Standing unsupported with eyes closed 3  Standing unsupported feet together 1  From standing position, reach forward with outstretched arm 3  From standing position, pick up object from floor 3  From standing position, turn and look  behind over each shoulder 3  Turn 360 1  Standing unsupported, alternately place foot on step 1  Standing unsupported, one foot in front 1  Standing on one leg 0  Total:  28    GAIT: Distance walked: 500 ft Assistive device utilized: None Level of assistance: Complete Independence Comments: shorted step length, decreased cadence  TREATMENT  [facility] Adult PT Treatment:                                                DATE: 09/10/23 Pt seen for aquatic therapy today.  Treatment took place in water 3.5-4.75 ft in depth at the [facility] pool. Temp of water was 91.  Pt entered/exited the pool via stairs using step to pattern with hand rail.  *walking forward, back and side stepping in 3.6 ft with unsupported *L stretch x 2.  Some right shoulder discomfort *side stepping with RBHB ue add/abd.  Cues for slowed pacing and touching feet together for balance challenge. *HB Carry RBHB forward and back.  Instruction on abdominal bracing for core activation.-> bilaterally then  unilaterally. Cues for maintaining a direct path - seated rest period on water bench *half noodle pull down in 3.8 ft staggered stances x 10 *tandem stances ue support RBHB holding statically x 20 s leading R/L->attempted unsupported with difficulty->ue add/abd x 10 completes after several trials. *seated on lift: cycling; hip add/abd; LAQ  Pt requires the buoyancy and hydrostatic pressure of water for support, and to offload joints by unweighting joint load by at least 50 % in navel deep water and by at least 75-80% in chest to neck deep water.  Viscosity of the water  is needed for resistance of strengthening. Water current perturbations provides challenge to standing balance requiring increased core activation.                                                                                                                          PATIENT EDUCATION:  Education details: Discussed eval findings, rehab rationale, aquatic program progression/POC and pools in area. Patient is in agreement  Person educated: Patient Education method: Explanation Education comprehension: verbalized understanding  HOME EXERCISE PROGRAM: TBA  ASSESSMENT:  CLINICAL IMPRESSION: Focus on core stretching and strengthening along with initiating balance work. She requires VC and demonstration for execution.  Good completion once motor plans established.  Is unable to maintain Tandem stance unsupported. Good toleration of session. Will plan to continue with aquatics using the properties of water to progress towards land based goals.   Initial Impression Patient is a 83 y.o. f who was seen today for physical therapy evaluation and treatment for LBP due to lumbar spondylosis. It is also noted she has a Convex left scoliosis centered at L3 .  She is an active senior and reports reduction in mobility recently partly due  to back pain and fear of falling.  Exam demonstrates she has bilat hip weakness, core weakness and as per Lars balance is a high fall risk. Her pain level increases in LB with extended times standing/walking.  It will reduce to 0/10 with rest.  No interruptions in sleep. Pt will benefit from skilled physical therapy to improve all areas of deficits which are effecting her functional mobility and decreasing safety with ADL's.  Plan to initiate in aquatic then transition to land when strength is gained, balance is improved and pain is reduced.  OBJECTIVE IMPAIRMENTS: Abnormal gait, decreased activity tolerance, decreased balance, decreased endurance, decreased mobility,  difficulty walking, decreased strength, postural dysfunction, and pain.   ACTIVITY LIMITATIONS: carrying, lifting, bending, standing, squatting, sleeping, stairs, transfers, locomotion level, and caring for others  PARTICIPATION LIMITATIONS: meal prep, cleaning, laundry, shopping, community activity, and yard work  PERSONAL FACTORS: Past/current experiences, Time since onset of injury/illness/exacerbation, and 1 comorbidity: see PmHx are also affecting patient's functional outcome.   REHAB POTENTIAL: Good  CLINICAL DECISION MAKING: Evolving/moderate complexity  EVALUATION COMPLEXITY: Moderate   GOALS: Goals reviewed with patient? Yes  SHORT TERM GOALS: Target date: 09/26/23  Pt will tolerate full aquatic sessions consistently without increase in pain and with improving function to demonstrate good toleration and effectiveness of intervention.  Baseline: Goal status: INITIAL  2.  Pt will tolerate walking to and from setting and engaging in aquatic therapy session without excessive fatigue or increase in pain to demonstrate improved toleration to activity. Baseline:  Goal status: INITIAL  3.  Pt will perform tandem and SLS in 3.6 ft holding for up towards 15s to demonstrate improvements in balance ability Baseline: unable to complete on land Goal status: INITIAL    LONG TERM GOALS: Target date: 11/03/23  Pt to improve on ODI by 15% (MCID) to demonstrate statistically significant Improvement in function. Baseline: 14/50=28% Goal status: INITIAL  2.  Pt will be indep with final HEP's (land and aquatic as appropriate) for continued management of condition Baseline:  Goal status: INITIAL  3.  Pt will report decrease in pain by at least 50% for improved toleration to activity/quality of life and to demonstrate improved management of pain. Baseline:  Goal status: INITIAL  4.  Pt will report standing toleration up towards 30 minutes to prepare a meal or do dishes without  limiting pain Baseline: 15 min Max Goal status: INITIAL  5.  Pt will improve on Berg balance test to >/= 43/56  (MDC 35/56) to demonstrate a decrease in fall risk. Baseline: 28/56 Goal status: INITIAL  6.  Pt will improve strength in hips by at least 10 lbs (HD) to demonstrate improved overall physical function Baseline: see chart Goal status: INITIAL  PLAN:  PT FREQUENCY: 2x/week  PT DURATION: 10 weeks 16 visits  PLANNED INTERVENTIONS: 97164- PT Re-evaluation, 97110-Therapeutic exercises, 97530- Therapeutic activity, 97112- Neuromuscular re-education, 97535- Self Care, 02859- Manual therapy, [id number]- Gait training, [phone]- Aquatic Therapy, [phone]- Electrical stimulation (unattended), [phone]- Ionotophoresis 4mg /ml Dexamethasone, Patient/Family education, Balance training, Stair training, Taping, Joint mobilization, DME instructions, Cryotherapy, and Moist heat.  PLAN FOR NEXT SESSION: Aquatic: le and core strengthening; balance and proprioception retraining, gait toleration   Ronal Foots) Myria Steenbergen MPT 09/10/23 10:59 AM [facility] [address] [place], [place], [address] Phone: [phone]   Fax:  [phone]

## 2023-09-17 ENCOUNTER — Encounter (HOSPITAL_BASED_OUTPATIENT_CLINIC_OR_DEPARTMENT_OTHER): Payer: Self-pay | Admitting: Physical Therapy

## 2023-09-17 ENCOUNTER — Ambulatory Visit (HOSPITAL_BASED_OUTPATIENT_CLINIC_OR_DEPARTMENT_OTHER): Attending: Sports Medicine | Admitting: Physical Therapy

## 2023-09-17 DIAGNOSIS — M6281 Muscle weakness (generalized): Secondary | ICD-10-CM | POA: Diagnosis not present

## 2023-09-17 DIAGNOSIS — R2681 Unsteadiness on feet: Secondary | ICD-10-CM | POA: Insufficient documentation

## 2023-09-17 DIAGNOSIS — R2689 Other abnormalities of gait and mobility: Secondary | ICD-10-CM | POA: Insufficient documentation

## 2023-09-17 DIAGNOSIS — M5459 Other low back pain: Secondary | ICD-10-CM | POA: Insufficient documentation

## 2023-09-17 NOTE — Therapy (Signed)
 OUTPATIENT PHYSICAL THERAPY THORACOLUMBAR EVALUATION   Patient Name: Brittany Oliver MRN: [id number] DOB:November 19, 1940, 83 y.o., female Today's Date: 09/17/2023  END OF SESSION:  PT End of Session - 09/17/23 1541     Visit Number 4    Number of Visits 16    Date for PT Re-Evaluation 11/01/23    Authorization Type mcr    Progress Note Due on Visit 10    PT Start Time 1533    PT Stop Time 1615    PT Time Calculation (min) 42 min    Activity Tolerance Patient tolerated treatment well    Behavior During Therapy [facility] for tasks assessed/performed           Past Medical History:  Diagnosis Date   Arthritis    Cancer (HCC) 02/2013   skin melanoma   Heart murmur    Hyperlipidemia    Hypertension    Neuralgia facialis vera    Stroke (HCC) 1987   has a little balance issue since stroke   Past Surgical History:  Procedure Laterality Date   ABDOMINAL HYSTERECTOMY  1974   pt still has her uterus, took only 1 ovary and 1 tube   APPENDECTOMY     COLONOSCOPY     DILATION AND CURETTAGE OF UTERUS  1965   ESOPHAGOGASTRODUODENOSCOPY (EGD) WITH PROPOFOL  Left 01/23/2019   Procedure: ESOPHAGOGASTRODUODENOSCOPY (EGD) WITH PROPOFOL ;  Surgeon: Burnette Fallow, MD;  Location: [facility];  Service: Endoscopy;  Laterality: Left;   EXCISION MELANOMA WITH SENTINEL LYMPH NODE BIOPSY Left 03/27/2013   Procedure: EXCISION MELANOMA LEFT POSTERIOR ARM WITH LEFT AXILLARY SENTINEL LYMPH NODE BIOPSY;  Surgeon: Dann FORBES Hummer, MD;  Location: [facility] OR;  Service: General;  Laterality: Left;   EYE SURGERY Bilateral 2006   cataract and laser surgery   FINGER SURGERY Right    middle finger   MASS EXCISION Left 09/15/2013   Procedure: EXCISION MASS LEFT UPPER ARM;  Surgeon: Dann FORBES Hummer, MD;  Location: [facility] OR;  Service: General;  Laterality: Left;   TONSILLECTOMY     Patient Active Problem List   Diagnosis Date Noted   Secondary hypercoagulable state (HCC) 03/26/2019   Paroxysmal atrial fibrillation  (HCC)    GI bleed 01/22/2019   Hypertension    Hyperlipidemia    Neuralgia facialis vera    COPD mixed type (HCC) 03/02/2015   Dyspnea 12/23/2014   Ex-cigarette smoker 12/23/2014   Seroma complicating a procedure 09/24/2013   Mass of arm 08/26/2013   Atypical facial pain 07/13/2013   Melanoma of left upper arm (HCC) 03/23/2013   Stroke (HCC) 1987   Anemia due to acute blood loss 1987    PCP: Montie Pizza MD  REFERRING PROVIDER: Juliene Bailey MD  REFERRING DIAG: [id number] (ICD-10-CM) - Other spondylosis, lumbar region   Rationale for Evaluation and Treatment: Rehabilitation  THERAPY DIAG:  Other low back pain  Muscle weakness (generalized)  Other abnormalities of gait and mobility  ONSET DATE: exacerbation 1 yr  SUBJECTIVE:  SUBJECTIVE STATEMENT: Feel pretty good.  Have been practicing getting up out of my chair and it is better   Initial Subjective Exercises about 2 x week walking out door.  My strength and balance are my 2 things I want to improve.  When I was 45 I had a brain stem stroke completely recovered except minor balance issue mostly when I am tired. Rotator nystagmus residual but MD says my brain has overrode it.  PERTINENT HISTORY:  Convex left scoliosis centered at L3  A-fib COPD  PAIN:  Are you having pain? Yes: NPRS scale: current 2/10; worst 7/10; least 0-1/10 Pain location: low thoracic upper lumbar spine Pain description: ache Aggravating factors: standing 15 minutes Relieving factors: sitting  PRECAUTIONS: None  RED FLAGS: None   WEIGHT BEARING RESTRICTIONS: No  FALLS:  Has patient fallen in last 6 months? No  LIVING ENVIRONMENT: Lives with: lives with their spouse Lives in: House/apartment Stairs: Yes: Internal: 16 steps; on right going up main floor  with bed and bath 1st floor Has following equipment at home: None  OCCUPATION: retired  PLOF: Independent  PATIENT GOALS: improve my strength and my balance  NEXT MD VISIT: as needed  OBJECTIVE:  Note: Objective measures were completed at Evaluation unless otherwise noted.  DIAGNOSTIC FINDINGS:  MRI Lumbar 11/23 IMPRESSION: 1. Compared with previous MRI from 2019, no acute findings or clear explanation for the patient's symptoms. 2. Chronic multilevel spondylosis associated with a convex left scoliosis, similar to previous study from 2019. 3. Chronic foraminal narrowing on the right at L3-4, bilaterally at L4-5 and on the left at L5-S1. No high-grade spinal stenosis or acute nerve root encroachment.  PATIENT SURVEYS:  Modified Oswestry 14/50=28%   COGNITION: Overall cognitive status: Within functional limits for tasks assessed     SENSATION: WFL  MUSCLE LENGTH: Hamstrings: wfl tested in sitting   POSTURE: rounded shoulders, forward head, increased thoracic kyphosis, and left pelvic obliquity Convex left scoliosis centered at L3   PALPATION: Mild TTP upper lumbar and lower thoracic paraspinals  LUMBAR ROM:   AROM eval  Flexion FT to mid calf P!  Extension Limited 25%  Right lateral flexion full  Left lateral flexion full  Right rotation   Left rotation    (Blank rows = not tested)  LOWER EXTREMITY ROM:     wfl  LOWER EXTREMITY MMT:    MMT Right eval Left eval  Hip flexion 31.1 32.0  Hip extension    Hip abduction 28.5 21.8  Hip adduction    Hip internal rotation    Hip external rotation    Knee flexion    Knee extension 29.7 35.1  Ankle dorsiflexion    Ankle plantarflexion    Ankle inversion    Ankle eversion     (Blank rows = not tested)  LUMBAR SPECIAL TESTS:  Slump test: Negative  FUNCTIONAL TESTS:  5 times sit to stand: from bench 24.45 using  Timed up and go (TUG): 13.24  BERG Balance Test          Date:   Sit to Stand 2   Standing unsupported 4  Sitting with back unsupported but feet supported 4  Stand to sit  2  Transfers  3  Standing unsupported with eyes closed 3  Standing unsupported feet together 1  From standing position, reach forward with outstretched arm 3  From standing position, pick up object from floor 3  From standing position, turn and look behind over each shoulder 3  Turn 360 1  Standing unsupported, alternately place foot on step 1  Standing unsupported, one foot in front 1  Standing on one leg 0  Total:  28    GAIT: Distance walked: 500 ft Assistive device utilized: None Level of assistance: Complete Independence Comments: shorted step length, decreased cadence  TREATMENT  [facility] Adult PT Treatment:                                                DATE: 09/17/23  STS from pool bench x 5 unsupported  Pt seen for aquatic therapy today.  Treatment took place in water 3.5-4.75 ft in depth at the [facility] pool. Temp of water was 91.  Pt entered/exited the pool via stairs using step to pattern with hand rail.  *walking forward, back and side stepping in 3.6 ft with unsupported *L stretch x 2.  Some right shoulder discomfort *side stepping with RBHB ue add/abd. *HB Carry RBHB forward and back.  Instruction on abdominal bracing for core activation.-> bilaterally then  unilaterally.  - seated rest period on lift *seated on lift: cycling; hip add/abd; LAQ *half noodle ->full hollow noodle pull down in 3.8 ft wide stance and staggered stances x 10 *step up onto 1st step leading R/L x 10 light ue support for balance *tandem stances ue support RBHB holding statically x 20 s leading R/L-> SLS  completed same x 20 s.  Requires 2 tries with rle *STS from water bench onto water step 2x 10.  VC for slowed decent.   Pt requires the buoyancy and hydrostatic pressure of water for support, and to offload joints by unweighting joint load by at least 50 % in navel deep water and by at  least 75-80% in chest to neck deep water.  Viscosity of the water is needed for resistance of strengthening. Water current perturbations provides challenge to standing balance requiring increased core activation.                                                                                                                          PATIENT EDUCATION:  Education details: Discussed eval findings, rehab rationale, aquatic program progression/POC and pools in area. Patient is in agreement  Person educated: Patient Education method: Explanation Education comprehension: verbalized understanding  HOME EXERCISE PROGRAM: TBA  ASSESSMENT:  CLINICAL IMPRESSION: Pt with good response from last session.  Some add soreness which passes as day progresses.  She demonstrates STS from pool bench with increased strength. Progressed balance and strength submerged.  She is given at least 2 seated rest periods.  She demonstrates improved tandem balance added SL.  Demonstrates good immediate standing balance with STS transfers. Goals ongoing     Initial Impression Patient is a 83 y.o. f who was seen today for physical therapy evaluation and treatment for LBP due  to lumbar spondylosis. It is also noted she has a Convex left scoliosis centered at L3 .  She is an active senior and reports reduction in mobility recently partly due to back pain and fear of falling.  Exam demonstrates she has bilat hip weakness, core weakness and as per Lars balance is a high fall risk. Her pain level increases in LB with extended times standing/walking.  It will reduce to 0/10 with rest.  No interruptions in sleep. Pt will benefit from skilled physical therapy to improve all areas of deficits which are effecting her functional mobility and decreasing safety with ADL's.  Plan to initiate in aquatic then transition to land when strength is gained, balance is improved and pain is reduced.  OBJECTIVE IMPAIRMENTS: Abnormal gait, decreased  activity tolerance, decreased balance, decreased endurance, decreased mobility, difficulty walking, decreased strength, postural dysfunction, and pain.   ACTIVITY LIMITATIONS: carrying, lifting, bending, standing, squatting, sleeping, stairs, transfers, locomotion level, and caring for others  PARTICIPATION LIMITATIONS: meal prep, cleaning, laundry, shopping, community activity, and yard work  PERSONAL FACTORS: Past/current experiences, Time since onset of injury/illness/exacerbation, and 1 comorbidity: see PmHx are also affecting patient's functional outcome.   REHAB POTENTIAL: Good  CLINICAL DECISION MAKING: Evolving/moderate complexity  EVALUATION COMPLEXITY: Moderate   GOALS: Goals reviewed with patient? Yes  SHORT TERM GOALS: Target date: 09/26/23  Pt will tolerate full aquatic sessions consistently without increase in pain and with improving function to demonstrate good toleration and effectiveness of intervention.  Baseline: Goal status: INITIAL  2.  Pt will tolerate walking to and from setting and engaging in aquatic therapy session without excessive fatigue or increase in pain to demonstrate improved toleration to activity. Baseline:  Goal status: INITIAL  3.  Pt will perform tandem and SLS in 3.6 ft holding for up towards 15s to demonstrate improvements in balance ability Baseline: unable to complete on land Goal status: INITIAL    LONG TERM GOALS: Target date: 11/03/23  Pt to improve on ODI by 15% (MCID) to demonstrate statistically significant Improvement in function. Baseline: 14/50=28% Goal status: INITIAL  2.  Pt will be indep with final HEP's (land and aquatic as appropriate) for continued management of condition Baseline:  Goal status: INITIAL  3.  Pt will report decrease in pain by at least 50% for improved toleration to activity/quality of life and to demonstrate improved management of pain. Baseline:  Goal status: INITIAL  4.  Pt will report  standing toleration up towards 30 minutes to prepare a meal or do dishes without limiting pain Baseline: 15 min Max Goal status: INITIAL  5.  Pt will improve on Berg balance test to >/= 43/56  (MDC 35/56) to demonstrate a decrease in fall risk. Baseline: 28/56 Goal status: INITIAL  6.  Pt will improve strength in hips by at least 10 lbs (HD) to demonstrate improved overall physical function Baseline: see chart Goal status: INITIAL  PLAN:  PT FREQUENCY: 2x/week  PT DURATION: 10 weeks 16 visits  PLANNED INTERVENTIONS: 97164- PT Re-evaluation, 97110-Therapeutic exercises, 97530- Therapeutic activity, 97112- Neuromuscular re-education, 97535- Self Care, [id number]- Manual therapy, [id number]- Gait training, [phone]- Aquatic Therapy, [phone]- Electrical stimulation (unattended), [phone]- Ionotophoresis 4mg /ml Dexamethasone, Patient/Family education, Balance training, Stair training, Taping, Joint mobilization, DME instructions, Cryotherapy, and Moist heat.  PLAN FOR NEXT SESSION: Aquatic: le and core strengthening; balance and proprioception retraining, gait toleration   Ronal Foots) Geoge Lawrance MPT 09/17/23 3:44 PM [facility] [address] [place], [place], [address] Phone:  [phone]   Fax:  [phone]

## 2023-09-19 ENCOUNTER — Ambulatory Visit (HOSPITAL_BASED_OUTPATIENT_CLINIC_OR_DEPARTMENT_OTHER): Payer: Self-pay | Admitting: Physical Therapy

## 2023-09-19 ENCOUNTER — Encounter (HOSPITAL_BASED_OUTPATIENT_CLINIC_OR_DEPARTMENT_OTHER): Payer: Self-pay | Admitting: Physical Therapy

## 2023-09-19 DIAGNOSIS — R2689 Other abnormalities of gait and mobility: Secondary | ICD-10-CM

## 2023-09-19 DIAGNOSIS — M6281 Muscle weakness (generalized): Secondary | ICD-10-CM

## 2023-09-19 DIAGNOSIS — R2681 Unsteadiness on feet: Secondary | ICD-10-CM

## 2023-09-19 DIAGNOSIS — M5459 Other low back pain: Secondary | ICD-10-CM

## 2023-09-19 NOTE — Therapy (Signed)
 OUTPATIENT PHYSICAL THERAPY THORACOLUMBAR TREATMENT   Patient Name: Brittany Oliver MRN: [id number] DOB:Sep 20, 1940, 83 y.o., female Today's Date: 09/19/2023  END OF SESSION:  PT End of Session - 09/19/23 1447     Visit Number 5    Number of Visits 16    Date for PT Re-Evaluation 11/01/23    Authorization Type mcr    Progress Note Due on Visit 10    PT Start Time 1446    PT Stop Time 1525    PT Time Calculation (min) 39 min    Activity Tolerance Patient tolerated treatment well    Behavior During Therapy [facility] for tasks assessed/performed           Past Medical History:  Diagnosis Date   Arthritis    Cancer (HCC) 02/2013   skin melanoma   Heart murmur    Hyperlipidemia    Hypertension    Neuralgia facialis vera    Stroke (HCC) 1987   has a little balance issue since stroke   Past Surgical History:  Procedure Laterality Date   ABDOMINAL HYSTERECTOMY  1974   pt still has her uterus, took only 1 ovary and 1 tube   APPENDECTOMY     COLONOSCOPY     DILATION AND CURETTAGE OF UTERUS  1965   ESOPHAGOGASTRODUODENOSCOPY (EGD) WITH PROPOFOL  Left 01/23/2019   Procedure: ESOPHAGOGASTRODUODENOSCOPY (EGD) WITH PROPOFOL ;  Surgeon: Burnette Fallow, MD;  Location: [facility];  Service: Endoscopy;  Laterality: Left;   EXCISION MELANOMA WITH SENTINEL LYMPH NODE BIOPSY Left 03/27/2013   Procedure: EXCISION MELANOMA LEFT POSTERIOR ARM WITH LEFT AXILLARY SENTINEL LYMPH NODE BIOPSY;  Surgeon: Dann FORBES Hummer, MD;  Location: [facility] OR;  Service: General;  Laterality: Left;   EYE SURGERY Bilateral 2006   cataract and laser surgery   FINGER SURGERY Right    middle finger   MASS EXCISION Left 09/15/2013   Procedure: EXCISION MASS LEFT UPPER ARM;  Surgeon: Dann FORBES Hummer, MD;  Location: [facility] OR;  Service: General;  Laterality: Left;   TONSILLECTOMY     Patient Active Problem List   Diagnosis Date Noted   Secondary hypercoagulable state (HCC) 03/26/2019   Paroxysmal atrial fibrillation (HCC)     GI bleed 01/22/2019   Hypertension    Hyperlipidemia    Neuralgia facialis vera    COPD mixed type (HCC) 03/02/2015   Dyspnea 12/23/2014   Ex-cigarette smoker 12/23/2014   Seroma complicating a procedure 09/24/2013   Mass of arm 08/26/2013   Atypical facial pain 07/13/2013   Melanoma of left upper arm (HCC) 03/23/2013   Stroke (HCC) 1987   Anemia due to acute blood loss 1987    PCP: Montie Pizza MD  REFERRING PROVIDER: Juliene Bailey MD  REFERRING DIAG: [phone] (ICD-10-CM) - Other spondylosis, lumbar region   Rationale for Evaluation and Treatment: Rehabilitation  THERAPY DIAG:  Other low back pain  Muscle weakness (generalized)  Other abnormalities of gait and mobility  Unsteadiness on feet  ONSET DATE: exacerbation 1 yr  SUBJECTIVE:  SUBJECTIVE STATEMENT: I felt good after last treatment today I am hurting a little more, I don't know why.   Initial Subjective Exercises about 2 x week walking out door.  My strength and balance are my 2 things I want to improve.  When I was 45 I had a brain stem stroke completely recovered except minor balance issue mostly when I am tired. Rotator nystagmus residual but MD says my brain has overrode it.  PERTINENT HISTORY:  Convex left scoliosis centered at L3  A-fib COPD  PAIN:  Are you having pain? Yes: NPRS scale: current 4/10; worst 7/10; least 0-1/10 Pain location: low thoracic upper lumbar spine Pain description: ache Aggravating factors: standing 15 minutes Relieving factors: sitting  PRECAUTIONS: None  RED FLAGS: None   WEIGHT BEARING RESTRICTIONS: No  FALLS:  Has patient fallen in last 6 months? No  LIVING ENVIRONMENT: Lives with: lives with their spouse Lives in: House/apartment Stairs: Yes: Internal: 16 steps; on  right going up main floor with bed and bath 1st floor Has following equipment at home: None  OCCUPATION: retired  PLOF: Independent  PATIENT GOALS: improve my strength and my balance  NEXT MD VISIT: as needed  OBJECTIVE:  Note: Objective measures were completed at Evaluation unless otherwise noted.  DIAGNOSTIC FINDINGS:  MRI Lumbar 11/23 IMPRESSION: 1. Compared with previous MRI from 2019, no acute findings or clear explanation for the patient's symptoms. 2. Chronic multilevel spondylosis associated with a convex left scoliosis, similar to previous study from 2019. 3. Chronic foraminal narrowing on the right at L3-4, bilaterally at L4-5 and on the left at L5-S1. No high-grade spinal stenosis or acute nerve root encroachment.  PATIENT SURVEYS:  Modified Oswestry 14/50=28%   COGNITION: Overall cognitive status: Within functional limits for tasks assessed     SENSATION: WFL  MUSCLE LENGTH: Hamstrings: wfl tested in sitting   POSTURE: rounded shoulders, forward head, increased thoracic kyphosis, and left pelvic obliquity Convex left scoliosis centered at L3   PALPATION: Mild TTP upper lumbar and lower thoracic paraspinals  LUMBAR ROM:   AROM eval  Flexion FT to mid calf P!  Extension Limited 25%  Right lateral flexion full  Left lateral flexion full  Right rotation   Left rotation    (Blank rows = not tested)  LOWER EXTREMITY ROM:     wfl  LOWER EXTREMITY MMT:    MMT Right eval Left eval  Hip flexion 31.1 32.0  Hip extension    Hip abduction 28.5 21.8  Hip adduction    Hip internal rotation    Hip external rotation    Knee flexion    Knee extension 29.7 35.1  Ankle dorsiflexion    Ankle plantarflexion    Ankle inversion    Ankle eversion     (Blank rows = not tested)  LUMBAR SPECIAL TESTS:  Slump test: Negative  FUNCTIONAL TESTS:  5 times sit to stand: from bench 24.45 using  Timed up and go (TUG): 13.24  BERG Balance Test          Date:    Sit to Stand 2  Standing unsupported 4  Sitting with back unsupported but feet supported 4  Stand to sit  2  Transfers  3  Standing unsupported with eyes closed 3  Standing unsupported feet together 1  From standing position, reach forward with outstretched arm 3  From standing position, pick up object from floor 3  From standing position, turn and look behind over each shoulder 3  Turn 360 1  Standing unsupported, alternately place foot on step 1  Standing unsupported, one foot in front 1  Standing on one leg 0  Total:  28    GAIT: Distance walked: 500 ft Assistive device utilized: None Level of assistance: Complete Independence Comments: shorted step length, decreased cadence  TREATMENT  [facility] Adult PT Treatment:                                                DATE: 09/19/23 Pt seen for aquatic therapy today.  Treatment took place in water 3.5-4.75 ft in depth at the [facility] pool. Temp of water was 91.  Pt entered/exited the pool via stairs using step to pattern with hand rail.  *walking forward, back and side stepping in 3.6 ft with unsupported *L stretch x 3 at steps holding to hand rail (no shoulder pain).   *side stepping with RBHB ue add/abd. *HB Carry RBHB forward and back.  Instruction on abdominal bracing for core activation.-> bilaterally then  unilaterally.  - seated rest period on lift *seated on lift: cycling; hip add/abd; LAQ *full hollow noodle pull down in 3.6 ft wide stance and staggered stances x 10 *tandem stances ue support RBHB holding statically x 20 s leading R/L-> SLS  completed same x 20 s.  Requires 2 tries with rle *step up onto 1st step leading R/L x 10 light ue support -> unsupported (good challenge)for balance    Pt requires the buoyancy and hydrostatic pressure of water for support, and to offload joints by unweighting joint load by at least 50 % in navel deep water and by at least 75-80% in chest to neck deep water.  Viscosity of  the water is needed for resistance of strengthening. Water current perturbations provides challenge to standing balance requiring increased core activation.                                                                                                                          PATIENT EDUCATION:  Education details: Discussed eval findings, rehab rationale, aquatic program progression/POC and pools in area. Patient is in agreement  Person educated: Patient Education method: Explanation Education comprehension: verbalized understanding  HOME EXERCISE PROGRAM: TBA  ASSESSMENT:  CLINICAL IMPRESSION: Pt demonstrates improved tandem and SLS with ue support.  Able to progress to unsupported and then trial dynamic balance.  She gives good effort and has good attitude.  She does tire by end of session.  LBP remains same.  STG #1 met and she is making quick progress towards STG 2&3. Goals ongoing      Initial Impression Patient is a 83 y.o. f who was seen today for physical therapy evaluation and treatment for LBP due to lumbar spondylosis. It is also noted she has a Convex left scoliosis centered at L3 .  She is an active senior and reports reduction in mobility recently partly due to back pain and fear of falling.  Exam demonstrates she has bilat hip weakness, core weakness and as per Lars balance is a high fall risk. Her pain level increases in LB with extended times standing/walking.  It will reduce to 0/10 with rest.  No interruptions in sleep. Pt will benefit from skilled physical therapy to improve all areas of deficits which are effecting her functional mobility and decreasing safety with ADL's.  Plan to initiate in aquatic then transition to land when strength is gained, balance is improved and pain is reduced.  OBJECTIVE IMPAIRMENTS: Abnormal gait, decreased activity tolerance, decreased balance, decreased endurance, decreased mobility, difficulty walking, decreased strength, postural  dysfunction, and pain.   ACTIVITY LIMITATIONS: carrying, lifting, bending, standing, squatting, sleeping, stairs, transfers, locomotion level, and caring for others  PARTICIPATION LIMITATIONS: meal prep, cleaning, laundry, shopping, community activity, and yard work  PERSONAL FACTORS: Past/current experiences, Time since onset of injury/illness/exacerbation, and 1 comorbidity: see PmHx are also affecting patient's functional outcome.   REHAB POTENTIAL: Good  CLINICAL DECISION MAKING: Evolving/moderate complexity  EVALUATION COMPLEXITY: Moderate   GOALS: Goals reviewed with patient? Yes  SHORT TERM GOALS: Target date: 09/26/23  Pt will tolerate full aquatic sessions consistently without increase in pain and with improving function to demonstrate good toleration and effectiveness of intervention.  Baseline: Goal status:Met 09/19/23  2.  Pt will tolerate walking to and from setting and engaging in aquatic therapy session without excessive fatigue or increase in pain to demonstrate improved toleration to activity. Baseline:  Goal status: INITIAL  3.  Pt will perform tandem and SLS in 3.6 ft holding for up towards 15s to demonstrate improvements in balance ability Baseline: unable to complete on land Goal status: In progress. 09/19/23    LONG TERM GOALS: Target date: 11/03/23  Pt to improve on ODI by 15% (MCID) to demonstrate statistically significant Improvement in function. Baseline: 14/50=28% Goal status: INITIAL  2.  Pt will be indep with final HEP's (land and aquatic as appropriate) for continued management of condition Baseline:  Goal status: INITIAL  3.  Pt will report decrease in pain by at least 50% for improved toleration to activity/quality of life and to demonstrate improved management of pain. Baseline:  Goal status: INITIAL  4.  Pt will report standing toleration up towards 30 minutes to prepare a meal or do dishes without limiting pain Baseline: 15 min Max Goal  status: INITIAL  5.  Pt will improve on Berg balance test to >/= 43/56  (MDC 35/56) to demonstrate a decrease in fall risk. Baseline: 28/56 Goal status: INITIAL  6.  Pt will improve strength in hips by at least 10 lbs (HD) to demonstrate improved overall physical function Baseline: see chart Goal status: INITIAL  PLAN:  PT FREQUENCY: 2x/week  PT DURATION: 10 weeks 16 visits  PLANNED INTERVENTIONS: 97164- PT Re-evaluation, 97110-Therapeutic exercises, 97530- Therapeutic activity, 97112- Neuromuscular re-education, 97535- Self Care, [id number]- Manual therapy, [id number]- Gait training, [phone]- Aquatic Therapy, [phone]- Electrical stimulation (unattended), [phone]- Ionotophoresis 4mg /ml Dexamethasone, Patient/Family education, Balance training, Stair training, Taping, Joint mobilization, DME instructions, Cryotherapy, and Moist heat.  PLAN FOR NEXT SESSION: Aquatic: le and core strengthening; balance and proprioception retraining, gait toleration   Ronal Foots) Ayumi Wangerin MPT 09/19/23 3:02 PM [facility] [address] [place], [place], [address] Phone: [phone]   Fax:  [phone]

## 2023-09-25 ENCOUNTER — Ambulatory Visit (HOSPITAL_BASED_OUTPATIENT_CLINIC_OR_DEPARTMENT_OTHER): Admitting: Physical Therapy

## 2023-09-25 ENCOUNTER — Encounter (HOSPITAL_BASED_OUTPATIENT_CLINIC_OR_DEPARTMENT_OTHER): Payer: Self-pay | Admitting: Physical Therapy

## 2023-09-25 DIAGNOSIS — R2681 Unsteadiness on feet: Secondary | ICD-10-CM

## 2023-09-25 DIAGNOSIS — M5459 Other low back pain: Secondary | ICD-10-CM

## 2023-09-25 DIAGNOSIS — R2689 Other abnormalities of gait and mobility: Secondary | ICD-10-CM

## 2023-09-25 DIAGNOSIS — M6281 Muscle weakness (generalized): Secondary | ICD-10-CM | POA: Diagnosis not present

## 2023-09-25 NOTE — Therapy (Signed)
 OUTPATIENT PHYSICAL THERAPY THORACOLUMBAR TREATMENT   Patient Name: Brittany Oliver MRN: [id number] DOB:Sep 23, 1940, 83 y.o., female Today's Date: 09/25/2023  END OF SESSION:  PT End of Session - 09/25/23 1100     Visit Number 6    Number of Visits 16    Date for PT Re-Evaluation 11/01/23    Authorization Type MCR    Progress Note Due on Visit 10    PT Start Time 1055    PT Stop Time 1134    PT Time Calculation (min) 39 min    Activity Tolerance Patient tolerated treatment well    Behavior During Therapy [facility] for tasks assessed/performed           Past Medical History:  Diagnosis Date   Arthritis    Cancer (HCC) 02/2013   skin melanoma   Heart murmur    Hyperlipidemia    Hypertension    Neuralgia facialis vera    Stroke (HCC) 1987   has a little balance issue since stroke   Past Surgical History:  Procedure Laterality Date   ABDOMINAL HYSTERECTOMY  1974   pt still has her uterus, took only 1 ovary and 1 tube   APPENDECTOMY     COLONOSCOPY     DILATION AND CURETTAGE OF UTERUS  1965   ESOPHAGOGASTRODUODENOSCOPY (EGD) WITH PROPOFOL  Left 01/23/2019   Procedure: ESOPHAGOGASTRODUODENOSCOPY (EGD) WITH PROPOFOL ;  Surgeon: Burnette Fallow, MD;  Location: [facility];  Service: Endoscopy;  Laterality: Left;   EXCISION MELANOMA WITH SENTINEL LYMPH NODE BIOPSY Left 03/27/2013   Procedure: EXCISION MELANOMA LEFT POSTERIOR ARM WITH LEFT AXILLARY SENTINEL LYMPH NODE BIOPSY;  Surgeon: Dann FORBES Hummer, MD;  Location: [facility] OR;  Service: General;  Laterality: Left;   EYE SURGERY Bilateral 2006   cataract and laser surgery   FINGER SURGERY Right    middle finger   MASS EXCISION Left 09/15/2013   Procedure: EXCISION MASS LEFT UPPER ARM;  Surgeon: Dann FORBES Hummer, MD;  Location: [facility] OR;  Service: General;  Laterality: Left;   TONSILLECTOMY     Patient Active Problem List   Diagnosis Date Noted   Secondary hypercoagulable state (HCC) 03/26/2019   Paroxysmal atrial fibrillation (HCC)     GI bleed 01/22/2019   Hypertension    Hyperlipidemia    Neuralgia facialis vera    COPD mixed type (HCC) 03/02/2015   Dyspnea 12/23/2014   Ex-cigarette smoker 12/23/2014   Seroma complicating a procedure 09/24/2013   Mass of arm 08/26/2013   Atypical facial pain 07/13/2013   Melanoma of left upper arm (HCC) 03/23/2013   Stroke (HCC) 1987   Anemia due to acute blood loss 1987    PCP: Montie Pizza MD  REFERRING PROVIDER: Juliene Bailey MD  REFERRING DIAG: [phone] (ICD-10-CM) - Other spondylosis, lumbar region   Rationale for Evaluation and Treatment: Rehabilitation  THERAPY DIAG:  Other low back pain  Muscle weakness (generalized)  Other abnormalities of gait and mobility  Unsteadiness on feet  ONSET DATE: exacerbation 1 yr  SUBJECTIVE:  SUBJECTIVE STATEMENT: Pt reports that she was able to do some of the exercises in her niece's swimming pool.  Slept with pain patch on back last night and that helped.    Initial Subjective Exercises about 2 x week walking out door.  My strength and balance are my 2 things I want to improve.  When I was 45 I had a brain stem stroke completely recovered except minor balance issue mostly when I am tired. Rotator nystagmus residual but MD says my brain has overrode it.  PERTINENT HISTORY:  Convex left scoliosis centered at L3  A-fib COPD  PAIN:  Are you having pain? Yes: NPRS scale: current 2-3/10;  Pain location: Right hip Pain description: ache Aggravating factors: standing 15 minutes Relieving factors: sitting  PRECAUTIONS: None  RED FLAGS: None   WEIGHT BEARING RESTRICTIONS: No  FALLS:  Has patient fallen in last 6 months? No  LIVING ENVIRONMENT: Lives with: lives with their spouse Lives in: House/apartment Stairs: Yes: Internal:  16 steps; on right going up main floor with bed and bath 1st floor Has following equipment at home: None  OCCUPATION: retired  PLOF: Independent  PATIENT GOALS: improve my strength and my balance  NEXT MD VISIT: as needed  OBJECTIVE:  Note: Objective measures were completed at Evaluation unless otherwise noted.  DIAGNOSTIC FINDINGS:  MRI Lumbar 11/23 IMPRESSION: 1. Compared with previous MRI from 2019, no acute findings or clear explanation for the patient's symptoms. 2. Chronic multilevel spondylosis associated with a convex left scoliosis, similar to previous study from 2019. 3. Chronic foraminal narrowing on the right at L3-4, bilaterally at L4-5 and on the left at L5-S1. No high-grade spinal stenosis or acute nerve root encroachment.  PATIENT SURVEYS:  Modified Oswestry 14/50=28%   COGNITION: Overall cognitive status: Within functional limits for tasks assessed     SENSATION: WFL  MUSCLE LENGTH: Hamstrings: wfl tested in sitting   POSTURE: rounded shoulders, forward head, increased thoracic kyphosis, and left pelvic obliquity Convex left scoliosis centered at L3   PALPATION: Mild TTP upper lumbar and lower thoracic paraspinals  LUMBAR ROM:   AROM eval  Flexion FT to mid calf P!  Extension Limited 25%  Right lateral flexion full  Left lateral flexion full  Right rotation   Left rotation    (Blank rows = not tested)  LOWER EXTREMITY ROM:     wfl  LOWER EXTREMITY MMT:    MMT Right eval Left eval  Hip flexion 31.1 32.0  Hip extension    Hip abduction 28.5 21.8  Hip adduction    Hip internal rotation    Hip external rotation    Knee flexion    Knee extension 29.7 35.1  Ankle dorsiflexion    Ankle plantarflexion    Ankle inversion    Ankle eversion     (Blank rows = not tested)  LUMBAR SPECIAL TESTS:  Slump test: Negative  FUNCTIONAL TESTS:  5 times sit to stand: from bench 24.45 using  Timed up and go (TUG): 13.24  BERG Balance Test           Date:   Sit to Stand 2  Standing unsupported 4  Sitting with back unsupported but feet supported 4  Stand to sit  2  Transfers  3  Standing unsupported with eyes closed 3  Standing unsupported feet together 1  From standing position, reach forward with outstretched arm 3  From standing position, pick up object from floor 3  From standing position, turn  and look behind over each shoulder 3  Turn 360 1  Standing unsupported, alternately place foot on step 1  Standing unsupported, one foot in front 1  Standing on one leg 0  Total:  28    GAIT: Distance walked: 500 ft Assistive device utilized: None Level of assistance: Complete Independence Comments: shorted step length, decreased cadence  TREATMENT  [facility] Adult PT Treatment:                                                DATE: 09/24/23 Pt seen for aquatic therapy today.  Treatment took place in water 3.5-4.75 ft in depth at the [facility] pool. Temp of water was 91.  Pt entered/exited the pool via stairs using step to pattern with hand rail.  *unsupported: walking forward/ backward  in 3.6 ft  * side stepping unsupported-> with arm add/abdct -> with rainbow hand floats * suit case carry with single yellow hand float at side, walking backwards / forwards * UE On wall:  leg swings into hip flexion/ extension x 10 each * UE on yellow hand floats:  hip abdct/add x 10 each; heel/toe raises x 10; marching with row motion (forward/backward) *hip hinge with forward arm reach with UE on yellow hand floats x 3 * alternating toe taps to first step, without UE support -> 1st step, 2nd step (challenge when in Rt SLS) * figure 4 stretch with UE on rails  * Rt/Lt hamstring stretch with foot on 2nd step     Pt requires the buoyancy and hydrostatic pressure of water for support, and to offload joints by unweighting joint load by at least 50 % in navel deep water and by at least 75-80% in chest to neck deep water.  Viscosity of the  water is needed for resistance of strengthening. Water current perturbations provides challenge to standing balance requiring increased core activation.                                                                                                                          PATIENT EDUCATION:  Education details: aquatic therapy exercise progressions and modifications Person educated: Patient Education method: Explanation Education comprehension: verbalized understanding  HOME EXERCISE PROGRAM: TBA  ASSESSMENT:  CLINICAL IMPRESSION: Pt tolerated session well without increase in pain.  No change in Rt hip pain.  No need for rest break during session.  Will continue to progress as tolerated.  Will issue list of area pools next session as the current pool she has access to is outdoors.  Progressing well towards remaining goals.       Initial Impression Patient is a 83 y.o. f who was seen today for physical therapy evaluation and treatment for LBP due to lumbar spondylosis. It is also noted she has a Convex left scoliosis centered at L3 .  She is an active senior and reports reduction in mobility recently partly due to back pain and fear of falling.  Exam demonstrates she has bilat hip weakness, core weakness and as per Lars balance is a high fall risk. Her pain level increases in LB with extended times standing/walking.  It will reduce to 0/10 with rest.  No interruptions in sleep. Pt will benefit from skilled physical therapy to improve all areas of deficits which are effecting her functional mobility and decreasing safety with ADL's.  Plan to initiate in aquatic then transition to land when strength is gained, balance is improved and pain is reduced.  OBJECTIVE IMPAIRMENTS: Abnormal gait, decreased activity tolerance, decreased balance, decreased endurance, decreased mobility, difficulty walking, decreased strength, postural dysfunction, and pain.   ACTIVITY LIMITATIONS: carrying, lifting,  bending, standing, squatting, sleeping, stairs, transfers, locomotion level, and caring for others  PARTICIPATION LIMITATIONS: meal prep, cleaning, laundry, shopping, community activity, and yard work  PERSONAL FACTORS: Past/current experiences, Time since onset of injury/illness/exacerbation, and 1 comorbidity: see PmHx are also affecting patient's functional outcome.   REHAB POTENTIAL: Good  CLINICAL DECISION MAKING: Evolving/moderate complexity  EVALUATION COMPLEXITY: Moderate   GOALS: Goals reviewed with patient? Yes  SHORT TERM GOALS: Target date: 09/26/23  Pt will tolerate full aquatic sessions consistently without increase in pain and with improving function to demonstrate good toleration and effectiveness of intervention.  Baseline: Goal status:Met 09/19/23  2.  Pt will tolerate walking to and from setting and engaging in aquatic therapy session without excessive fatigue or increase in pain to demonstrate improved toleration to activity. Baseline:  Goal status: INITIAL  3.  Pt will perform tandem and SLS in 3.6 ft holding for up towards 15s to demonstrate improvements in balance ability Baseline: unable to complete on land Goal status: In progress. 09/19/23    LONG TERM GOALS: Target date: 11/03/23  Pt to improve on ODI by 15% (MCID) to demonstrate statistically significant Improvement in function. Baseline: 14/50=28% Goal status: INITIAL  2.  Pt will be indep with final HEP's (land and aquatic as appropriate) for continued management of condition Baseline:  Goal status: INITIAL  3.  Pt will report decrease in pain by at least 50% for improved toleration to activity/quality of life and to demonstrate improved management of pain. Baseline:  Goal status: INITIAL  4.  Pt will report standing toleration up towards 30 minutes to prepare a meal or do dishes without limiting pain Baseline: 15 min Max Goal status: INITIAL  5.  Pt will improve on Berg balance test to >/=  43/56  (MDC 35/56) to demonstrate a decrease in fall risk. Baseline: 28/56 Goal status: INITIAL  6.  Pt will improve strength in hips by at least 10 lbs (HD) to demonstrate improved overall physical function Baseline: see chart Goal status: INITIAL  PLAN:  PT FREQUENCY: 2x/week  PT DURATION: 10 weeks 16 visits  PLANNED INTERVENTIONS: [id number]- PT Re-evaluation, 97110-Therapeutic exercises, 97530- Therapeutic activity, 97112- Neuromuscular re-education, 97535- Self Care, [id number]- Manual therapy, [id number]- Gait training, [phone]- Aquatic Therapy, [phone]- Electrical stimulation (unattended), [phone]- Ionotophoresis 4mg /ml Dexamethasone, Patient/Family education, Balance training, Stair training, Taping, Joint mobilization, DME instructions, Cryotherapy, and Moist heat.  PLAN FOR NEXT SESSION: Aquatic: LE and core strengthening; balance and proprioception retraining, gait toleration  Delon Aquas, PTA 09/25/23 12:41 PM [facility] [address] [place], [place], [address] Phone: [phone]   Fax:  [phone]

## 2023-09-27 ENCOUNTER — Encounter (HOSPITAL_BASED_OUTPATIENT_CLINIC_OR_DEPARTMENT_OTHER): Payer: Self-pay | Admitting: Physical Therapy

## 2023-09-27 ENCOUNTER — Ambulatory Visit (HOSPITAL_BASED_OUTPATIENT_CLINIC_OR_DEPARTMENT_OTHER): Admitting: Physical Therapy

## 2023-09-27 DIAGNOSIS — R2681 Unsteadiness on feet: Secondary | ICD-10-CM

## 2023-09-27 DIAGNOSIS — M5459 Other low back pain: Secondary | ICD-10-CM

## 2023-09-27 DIAGNOSIS — M6281 Muscle weakness (generalized): Secondary | ICD-10-CM

## 2023-09-27 DIAGNOSIS — R2689 Other abnormalities of gait and mobility: Secondary | ICD-10-CM

## 2023-09-27 NOTE — Therapy (Signed)
 OUTPATIENT PHYSICAL THERAPY THORACOLUMBAR TREATMENT   Patient Name: Brittany Oliver MRN: [id number] DOB:07-May-1940, 83 y.o., female Today's Date: 09/27/2023  END OF SESSION:  PT End of Session - 09/27/23 1110     Visit Number 7    Number of Visits 16    Date for PT Re-Evaluation 11/01/23    Authorization Type MCR    Progress Note Due on Visit 10    PT Start Time 1100    PT Stop Time 1140    PT Time Calculation (min) 40 min    Activity Tolerance Patient tolerated treatment well    Behavior During Therapy [facility] for tasks assessed/performed           Past Medical History:  Diagnosis Date   Arthritis    Cancer (HCC) 02/2013   skin melanoma   Heart murmur    Hyperlipidemia    Hypertension    Neuralgia facialis vera    Stroke (HCC) 1987   has a little balance issue since stroke   Past Surgical History:  Procedure Laterality Date   ABDOMINAL HYSTERECTOMY  1974   pt still has her uterus, took only 1 ovary and 1 tube   APPENDECTOMY     COLONOSCOPY     DILATION AND CURETTAGE OF UTERUS  1965   ESOPHAGOGASTRODUODENOSCOPY (EGD) WITH PROPOFOL  Left 01/23/2019   Procedure: ESOPHAGOGASTRODUODENOSCOPY (EGD) WITH PROPOFOL ;  Surgeon: Burnette Fallow, MD;  Location: [facility];  Service: Endoscopy;  Laterality: Left;   EXCISION MELANOMA WITH SENTINEL LYMPH NODE BIOPSY Left 03/27/2013   Procedure: EXCISION MELANOMA LEFT POSTERIOR ARM WITH LEFT AXILLARY SENTINEL LYMPH NODE BIOPSY;  Surgeon: Dann FORBES Hummer, MD;  Location: [facility] OR;  Service: General;  Laterality: Left;   EYE SURGERY Bilateral 2006   cataract and laser surgery   FINGER SURGERY Right    middle finger   MASS EXCISION Left 09/15/2013   Procedure: EXCISION MASS LEFT UPPER ARM;  Surgeon: Dann FORBES Hummer, MD;  Location: [facility] OR;  Service: General;  Laterality: Left;   TONSILLECTOMY     Patient Active Problem List   Diagnosis Date Noted   Secondary hypercoagulable state (HCC) 03/26/2019   Paroxysmal atrial fibrillation  (HCC)    GI bleed 01/22/2019   Hypertension    Hyperlipidemia    Neuralgia facialis vera    COPD mixed type (HCC) 03/02/2015   Dyspnea 12/23/2014   Ex-cigarette smoker 12/23/2014   Seroma complicating a procedure 09/24/2013   Mass of arm 08/26/2013   Atypical facial pain 07/13/2013   Melanoma of left upper arm (HCC) 03/23/2013   Stroke (HCC) 1987   Anemia due to acute blood loss 1987    PCP: Montie Pizza MD  REFERRING PROVIDER: Juliene Bailey MD  REFERRING DIAG: [id number] (ICD-10-CM) - Other spondylosis, lumbar region   Rationale for Evaluation and Treatment: Rehabilitation  THERAPY DIAG:  Other low back pain  Muscle weakness (generalized)  Other abnormalities of gait and mobility  Unsteadiness on feet  ONSET DATE: exacerbation 1 yr  SUBJECTIVE:  SUBJECTIVE STATEMENT: Pt reports that Rt hip pain remains the same. Can't really feel back pain now that hip is hurting.  She contemplates taking muscle relaxer prior to therapy sessions.    Initial Subjective Exercises about 2 x week walking out door.  My strength and balance are my 2 things I want to improve.  When I was 45 I had a brain stem stroke completely recovered except minor balance issue mostly when I am tired. Rotator nystagmus residual but MD says my brain has overrode it.  PERTINENT HISTORY:  Convex left scoliosis centered at L3  A-fib COPD  PAIN:  Are you having pain? Yes: NPRS scale: current 3/10;  Pain location: Right hip Pain description: ache Aggravating factors: standing 15 minutes Relieving factors: sitting  PRECAUTIONS: None  RED FLAGS: None   WEIGHT BEARING RESTRICTIONS: No  FALLS:  Has patient fallen in last 6 months? No  LIVING ENVIRONMENT: Lives with: lives with their spouse Lives in:  House/apartment Stairs: Yes: Internal: 16 steps; on right going up main floor with bed and bath 1st floor Has following equipment at home: None  OCCUPATION: retired  PLOF: Independent  PATIENT GOALS: improve my strength and my balance  NEXT MD VISIT: as needed  OBJECTIVE:  Note: Objective measures were completed at Evaluation unless otherwise noted.  DIAGNOSTIC FINDINGS:  MRI Lumbar 11/23 IMPRESSION: 1. Compared with previous MRI from 2019, no acute findings or clear explanation for the patient's symptoms. 2. Chronic multilevel spondylosis associated with a convex left scoliosis, similar to previous study from 2019. 3. Chronic foraminal narrowing on the right at L3-4, bilaterally at L4-5 and on the left at L5-S1. No high-grade spinal stenosis or acute nerve root encroachment.  PATIENT SURVEYS:  Modified Oswestry 14/50=28%   COGNITION: Overall cognitive status: Within functional limits for tasks assessed     SENSATION: WFL  MUSCLE LENGTH: Hamstrings: wfl tested in sitting   POSTURE: rounded shoulders, forward head, increased thoracic kyphosis, and left pelvic obliquity Convex left scoliosis centered at L3   PALPATION: Mild TTP upper lumbar and lower thoracic paraspinals  LUMBAR ROM:   AROM eval  Flexion FT to mid calf P!  Extension Limited 25%  Right lateral flexion full  Left lateral flexion full  Right rotation   Left rotation    (Blank rows = not tested)  LOWER EXTREMITY ROM:     wfl  LOWER EXTREMITY MMT:    MMT Right eval Left eval  Hip flexion 31.1 32.0  Hip extension    Hip abduction 28.5 21.8  Hip adduction    Hip internal rotation    Hip external rotation    Knee flexion    Knee extension 29.7 35.1  Ankle dorsiflexion    Ankle plantarflexion    Ankle inversion    Ankle eversion     (Blank rows = not tested)  LUMBAR SPECIAL TESTS:  Slump test: Negative  FUNCTIONAL TESTS:  5 times sit to stand: from bench 24.45 using  Timed up  and go (TUG): 13.24  BERG Balance Test          Date: eval  Sit to Stand 2  Standing unsupported 4  Sitting with back unsupported but feet supported 4  Stand to sit  2  Transfers  3  Standing unsupported with eyes closed 3  Standing unsupported feet together 1  From standing position, reach forward with outstretched arm 3  From standing position, pick up object from floor 3  From standing position, turn  and look behind over each shoulder 3  Turn 360 1  Standing unsupported, alternately place foot on step 1  Standing unsupported, one foot in front 1  Standing on one leg 0  Total:  28    GAIT: Distance walked: 500 ft Assistive device utilized: None Level of assistance: Complete Independence Comments: shorted step length, decreased cadence  TREATMENT  [facility] Adult PT Treatment:                                                DATE: 09/27/23  Self care: pt instructed in self massage with ball to Rt hip musculature. Pt returned demo with cues.  Pt seen for aquatic therapy today.  Treatment took place in water 3.5-4.75 ft in depth at the [facility] pool. Temp of water was 91.  Pt entered/exited the pool via stairs using step-to pattern with bilat hand rail.  *unsupported: walking forward/ backward  in 3.6 ft  * side stepping unsupported with arm add/abdct with yellow hand floats x 1 lap  * suit case carry with single yellow hand float at side, walking backwards / forwards - repeated 1 lap on Rt * UE On wall: Hip abdct/ add x 10; alternating single leg clams x 10; hip crosses x 10;  leg swings into hip flexion/ extension  x 10 each * UE on yellow hand floats: tandem gait forward/ backward,  3 way toe touch x 2-> without UE support on floats x 5;   * no UE support:  heel raises x 10 * Straddling yellow noodle with UE on corner: cycling; hip abdct/ add -> UE on yellow hand floats: cycling across pool  Pt requires the buoyancy and hydrostatic pressure of water for support, and  to offload joints by unweighting joint load by at least 50 % in navel deep water and by at least 75-80% in chest to neck deep water.  Viscosity of the water is needed for resistance of strengthening. Water current perturbations provides challenge to standing balance requiring increased core activation.                                                                                                                          PATIENT EDUCATION:  Education details: aquatic therapy exercise progressions and modifications Person educated: Patient Education method: Explanation Education comprehension: verbalized understanding  HOME EXERCISE PROGRAM: TBA  ASSESSMENT:  CLINICAL IMPRESSION: Pt reported some increase in Rt hip pain with ER (with clams), some report of Rt knee pain with cycling. Advised pt to trial ice/ heat to hip this weekend, and issued small pack of biofreeze to trial. Lower back remained pain free throughout session, but Rt hip pain remained unchanged. Balance on RLE today was improved from last visit.  Will continue to progress as tolerated.   Progressing well towards remaining goals.  Initial Impression Patient is a 83 y.o. f who was seen today for physical therapy evaluation and treatment for LBP due to lumbar spondylosis. It is also noted she has a Convex left scoliosis centered at L3 .  She is an active senior and reports reduction in mobility recently partly due to back pain and fear of falling.  Exam demonstrates she has bilat hip weakness, core weakness and as per Lars balance is a high fall risk. Her pain level increases in LB with extended times standing/walking.  It will reduce to 0/10 with rest.  No interruptions in sleep. Pt will benefit from skilled physical therapy to improve all areas of deficits which are effecting her functional mobility and decreasing safety with ADL's.  Plan to initiate in aquatic then transition to land when strength is gained, balance is  improved and pain is reduced.  OBJECTIVE IMPAIRMENTS: Abnormal gait, decreased activity tolerance, decreased balance, decreased endurance, decreased mobility, difficulty walking, decreased strength, postural dysfunction, and pain.   ACTIVITY LIMITATIONS: carrying, lifting, bending, standing, squatting, sleeping, stairs, transfers, locomotion level, and caring for others  PARTICIPATION LIMITATIONS: meal prep, cleaning, laundry, shopping, community activity, and yard work  PERSONAL FACTORS: Past/current experiences, Time since onset of injury/illness/exacerbation, and 1 comorbidity: see PmHx are also affecting patient's functional outcome.   REHAB POTENTIAL: Good  CLINICAL DECISION MAKING: Evolving/moderate complexity  EVALUATION COMPLEXITY: Moderate   GOALS: Goals reviewed with patient? Yes  SHORT TERM GOALS: Target date: 09/26/23  Pt will tolerate full aquatic sessions consistently without increase in pain and with improving function to demonstrate good toleration and effectiveness of intervention.  Baseline: Goal status:Met 09/19/23  2.  Pt will tolerate walking to and from setting and engaging in aquatic therapy session without excessive fatigue or increase in pain to demonstrate improved toleration to activity. Baseline: Increased Rt hip pain with gait Goal status: In progress - 09/27/23  3.  Pt will perform tandem and SLS in 3.6 ft holding for up towards 15s to demonstrate improvements in balance ability Baseline: unable to complete on land Goal status: In progress. 09/19/23    LONG TERM GOALS: Target date: 11/03/23  Pt to improve on ODI by 15% ([facility]) to demonstrate statistically significant Improvement in function. Baseline: 14/50=28% Goal status: INITIAL  2.  Pt will be indep with final HEP's (land and aquatic as appropriate) for continued management of condition Baseline:  Goal status: INITIAL  3.  Pt will report decrease in pain by at least 50% for improved toleration  to activity/quality of life and to demonstrate improved management of pain. Baseline:  Goal status: INITIAL  4.  Pt will report standing toleration up towards 30 minutes to prepare a meal or do dishes without limiting pain Baseline: 15 min Max Goal status: INITIAL  5.  Pt will improve on Berg balance test to >/= 43/56  (MDC 35/56) to demonstrate a decrease in fall risk. Baseline: 28/56 Goal status: INITIAL  6.  Pt will improve strength in hips by at least 10 lbs (HD) to demonstrate improved overall physical function Baseline: see chart Goal status: INITIAL  PLAN:  PT FREQUENCY: 2x/week  PT DURATION: 10 weeks 16 visits  PLANNED INTERVENTIONS: 97164- PT Re-evaluation, 97110-Therapeutic exercises, 97530- Therapeutic activity, 97112- Neuromuscular re-education, 97535- Self Care, [id number]- Manual therapy, [id number]- Gait training, [phone]- Aquatic Therapy, [phone]- Electrical stimulation (unattended), [phone]- Ionotophoresis 4mg /ml Dexamethasone, Patient/Family education, Balance training, Stair training, Taping, Joint mobilization, DME instructions, Cryotherapy, and Moist heat.  PLAN FOR NEXT SESSION: Aquatic: LE and  core strengthening; balance and proprioception retraining, gait toleration  Delon Aquas, PTA 09/27/23 11:54 AM [facility] [address] [place], [place], [address] Phone: [phone]   Fax:  [phone]

## 2023-10-01 ENCOUNTER — Encounter (HOSPITAL_BASED_OUTPATIENT_CLINIC_OR_DEPARTMENT_OTHER): Payer: Self-pay | Admitting: Physical Therapy

## 2023-10-01 ENCOUNTER — Ambulatory Visit (HOSPITAL_BASED_OUTPATIENT_CLINIC_OR_DEPARTMENT_OTHER): Admitting: Physical Therapy

## 2023-10-01 DIAGNOSIS — M5459 Other low back pain: Secondary | ICD-10-CM

## 2023-10-01 DIAGNOSIS — R2681 Unsteadiness on feet: Secondary | ICD-10-CM | POA: Diagnosis not present

## 2023-10-01 DIAGNOSIS — M6281 Muscle weakness (generalized): Secondary | ICD-10-CM

## 2023-10-01 DIAGNOSIS — R2689 Other abnormalities of gait and mobility: Secondary | ICD-10-CM | POA: Diagnosis not present

## 2023-10-01 NOTE — Therapy (Signed)
 OUTPATIENT PHYSICAL THERAPY THORACOLUMBAR TREATMENT   Patient Name: Brittany Oliver MRN: [id number] DOB:Dec 09, 1940, 83 y.o., female Today's Date: 10/01/2023  END OF SESSION:  PT End of Session - 10/01/23 1353     Visit Number 8    Number of Visits 16    Date for PT Re-Evaluation 11/01/23    Authorization Type MCR    Progress Note Due on Visit 10    PT Start Time 1352    PT Stop Time 1430    PT Time Calculation (min) 38 min    Activity Tolerance Patient tolerated treatment well    Behavior During Therapy [facility] for tasks assessed/performed           Past Medical History:  Diagnosis Date   Arthritis    Cancer (HCC) 02/2013   skin melanoma   Heart murmur    Hyperlipidemia    Hypertension    Neuralgia facialis vera    Stroke (HCC) 1987   has a little balance issue since stroke   Past Surgical History:  Procedure Laterality Date   ABDOMINAL HYSTERECTOMY  1974   pt still has her uterus, took only 1 ovary and 1 tube   APPENDECTOMY     COLONOSCOPY     DILATION AND CURETTAGE OF UTERUS  1965   ESOPHAGOGASTRODUODENOSCOPY (EGD) WITH PROPOFOL  Left 01/23/2019   Procedure: ESOPHAGOGASTRODUODENOSCOPY (EGD) WITH PROPOFOL ;  Surgeon: Burnette Fallow, MD;  Location: [facility];  Service: Endoscopy;  Laterality: Left;   EXCISION MELANOMA WITH SENTINEL LYMPH NODE BIOPSY Left 03/27/2013   Procedure: EXCISION MELANOMA LEFT POSTERIOR ARM WITH LEFT AXILLARY SENTINEL LYMPH NODE BIOPSY;  Surgeon: Dann FORBES Hummer, MD;  Location: [facility] OR;  Service: General;  Laterality: Left;   EYE SURGERY Bilateral 2006   cataract and laser surgery   FINGER SURGERY Right    middle finger   MASS EXCISION Left 09/15/2013   Procedure: EXCISION MASS LEFT UPPER ARM;  Surgeon: Dann FORBES Hummer, MD;  Location: [facility] OR;  Service: General;  Laterality: Left;   TONSILLECTOMY     Patient Active Problem List   Diagnosis Date Noted   Secondary hypercoagulable state (HCC) 03/26/2019   Paroxysmal atrial fibrillation  (HCC)    GI bleed 01/22/2019   Hypertension    Hyperlipidemia    Neuralgia facialis vera    COPD mixed type (HCC) 03/02/2015   Dyspnea 12/23/2014   Ex-cigarette smoker 12/23/2014   Seroma complicating a procedure 09/24/2013   Mass of arm 08/26/2013   Atypical facial pain 07/13/2013   Melanoma of left upper arm (HCC) 03/23/2013   Stroke (HCC) 1987   Anemia due to acute blood loss 1987    PCP: Montie Pizza MD  REFERRING PROVIDER: Juliene Bailey MD  REFERRING DIAG: [id number] (ICD-10-CM) - Other spondylosis, lumbar region   Rationale for Evaluation and Treatment: Rehabilitation  THERAPY DIAG:  Other low back pain  Muscle weakness (generalized)  Other abnormalities of gait and mobility  Unsteadiness on feet  ONSET DATE: exacerbation 1 yr  SUBJECTIVE:  SUBJECTIVE STATEMENT: Pt reports she is doing pool HEP. Hip and knee haven't been really good for the last couple of days. Symptoms come and go.    Initial Subjective Exercises about 2 x week walking out door.  My strength and balance are my 2 things I want to improve.  When I was 45 I had a brain stem stroke completely recovered except minor balance issue mostly when I am tired. Rotator nystagmus residual but MD says my brain has overrode it.  PERTINENT HISTORY:  Convex left scoliosis centered at L3  A-fib COPD  PAIN:  Are you having pain? Yes: NPRS scale: current 3/10;  Pain location: Right hip Pain description: ache Aggravating factors: standing 15 minutes Relieving factors: sitting  PRECAUTIONS: None  RED FLAGS: None   WEIGHT BEARING RESTRICTIONS: No  FALLS:  Has patient fallen in last 6 months? No  LIVING ENVIRONMENT: Lives with: lives with their spouse Lives in: House/apartment Stairs: Yes: Internal: 16 steps; on right  going up main floor with bed and bath 1st floor Has following equipment at home: None  OCCUPATION: retired  PLOF: Independent  PATIENT GOALS: improve my strength and my balance  NEXT MD VISIT: as needed  OBJECTIVE:  Note: Objective measures were completed at Evaluation unless otherwise noted.  DIAGNOSTIC FINDINGS:  MRI Lumbar 11/23 IMPRESSION: 1. Compared with previous MRI from 2019, no acute findings or clear explanation for the patient's symptoms. 2. Chronic multilevel spondylosis associated with a convex left scoliosis, similar to previous study from 2019. 3. Chronic foraminal narrowing on the right at L3-4, bilaterally at L4-5 and on the left at L5-S1. No high-grade spinal stenosis or acute nerve root encroachment.  PATIENT SURVEYS:  Modified Oswestry 14/50=28%   COGNITION: Overall cognitive status: Within functional limits for tasks assessed     SENSATION: WFL  MUSCLE LENGTH: Hamstrings: wfl tested in sitting   POSTURE: rounded shoulders, forward head, increased thoracic kyphosis, and left pelvic obliquity Convex left scoliosis centered at L3   PALPATION: Mild TTP upper lumbar and lower thoracic paraspinals  LUMBAR ROM:   AROM eval  Flexion FT to mid calf P!  Extension Limited 25%  Right lateral flexion full  Left lateral flexion full  Right rotation   Left rotation    (Blank rows = not tested)  LOWER EXTREMITY ROM:     wfl  LOWER EXTREMITY MMT:    MMT Right eval Left eval  Hip flexion 31.1 32.0  Hip extension    Hip abduction 28.5 21.8  Hip adduction    Hip internal rotation    Hip external rotation    Knee flexion    Knee extension 29.7 35.1  Ankle dorsiflexion    Ankle plantarflexion    Ankle inversion    Ankle eversion     (Blank rows = not tested)  LUMBAR SPECIAL TESTS:  Slump test: Negative  FUNCTIONAL TESTS:  5 times sit to stand: from bench 24.45 using  Timed up and go (TUG): 13.24  BERG Balance Test          Date:  eval  Sit to Stand 2  Standing unsupported 4  Sitting with back unsupported but feet supported 4  Stand to sit  2  Transfers  3  Standing unsupported with eyes closed 3  Standing unsupported feet together 1  From standing position, reach forward with outstretched arm 3  From standing position, pick up object from floor 3  From standing position, turn and look behind over each  shoulder 3  Turn 360 1  Standing unsupported, alternately place foot on step 1  Standing unsupported, one foot in front 1  Standing on one leg 0  Total:  28    GAIT: Distance walked: 500 ft Assistive device utilized: None Level of assistance: Complete Independence Comments: shorted step length, decreased cadence  TREATMENT  10/01/23 DKTC with heels on green ball 10 x 5 second holds LTR 10 x 5 second holds Bridge 2 x 10 Standing hip abduction 2 x 10 STS 2 x 10  SLS with frequent UE support 2x 30 second holds Step up 4 inch 1 x 10 Lateral step up and over 4 inch 1 x 10    [facility] Adult PT Treatment:                                                DATE: 09/27/23  Self care: pt instructed in self massage with ball to Rt hip musculature. Pt returned demo with cues.  Pt seen for aquatic therapy today.  Treatment took place in water 3.5-4.75 ft in depth at the [facility] pool. Temp of water was 91.  Pt entered/exited the pool via stairs using step-to pattern with bilat hand rail.  *unsupported: walking forward/ backward  in 3.6 ft  * side stepping unsupported with arm add/abdct with yellow hand floats x 1 lap  * suit case carry with single yellow hand float at side, walking backwards / forwards - repeated 1 lap on Rt * UE On wall: Hip abdct/ add x 10; alternating single leg clams x 10; hip crosses x 10;  leg swings into hip flexion/ extension  x 10 each * UE on yellow hand floats: tandem gait forward/ backward,  3 way toe touch x 2-> without UE support on floats x 5;   * no UE support:  heel  raises x 10 * Straddling yellow noodle with UE on corner: cycling; hip abdct/ add -> UE on yellow hand floats: cycling across pool  Pt requires the buoyancy and hydrostatic pressure of water for support, and to offload joints by unweighting joint load by at least 50 % in navel deep water and by at least 75-80% in chest to neck deep water.  Viscosity of the water is needed for resistance of strengthening. Water current perturbations provides challenge to standing balance requiring increased core activation.                                                                                                                          PATIENT EDUCATION:  Education details: aquatic therapy exercise progressions and modifications Person educated: Patient Education method: Explanation Education comprehension: verbalized understanding  HOME EXERCISE PROGRAM: Access Code: [email or web address] URL: [email or web address] Date: 10/01/2023 Prepared by: Prentice Corleone Biegler  Exercises - Sit to Stand with Arms Crossed  - 1 x  daily - 7 x weekly - 3 sets - 10 reps - Supine Bridge  - 1 x daily - 7 x weekly - 3 sets - 10 reps - Standing Hip Abduction with Counter Support  - 1 x daily - 7 x weekly - 3 sets - 10 reps  ASSESSMENT:  CLINICAL IMPRESSION: Patient tolerating strengthening and mobility exercises well. Good mechanics Patient will continue to benefit from physical therapy in order to improve function and reduce impairment.        Initial Impression Patient is a 83 y.o. f who was seen today for physical therapy evaluation and treatment for LBP due to lumbar spondylosis. It is also noted she has a Convex left scoliosis centered at L3 .  She is an active senior and reports reduction in mobility recently partly due to back pain and fear of falling.  Exam demonstrates she has bilat hip weakness, core weakness and as per Lars balance is a high fall risk. Her pain level increases in LB with extended times  standing/walking.  It will reduce to 0/10 with rest.  No interruptions in sleep. Pt will benefit from skilled physical therapy to improve all areas of deficits which are effecting her functional mobility and decreasing safety with ADL's.  Plan to initiate in aquatic then transition to land when strength is gained, balance is improved and pain is reduced.  OBJECTIVE IMPAIRMENTS: Abnormal gait, decreased activity tolerance, decreased balance, decreased endurance, decreased mobility, difficulty walking, decreased strength, postural dysfunction, and pain.   ACTIVITY LIMITATIONS: carrying, lifting, bending, standing, squatting, sleeping, stairs, transfers, locomotion level, and caring for others  PARTICIPATION LIMITATIONS: meal prep, cleaning, laundry, shopping, community activity, and yard work  PERSONAL FACTORS: Past/current experiences, Time since onset of injury/illness/exacerbation, and 1 comorbidity: see PmHx are also affecting patient's functional outcome.   REHAB POTENTIAL: Good  CLINICAL DECISION MAKING: Evolving/moderate complexity  EVALUATION COMPLEXITY: Moderate   GOALS: Goals reviewed with patient? Yes  SHORT TERM GOALS: Target date: 09/26/23  Pt will tolerate full aquatic sessions consistently without increase in pain and with improving function to demonstrate good toleration and effectiveness of intervention.  Baseline: Goal status:Met 09/19/23  2.  Pt will tolerate walking to and from setting and engaging in aquatic therapy session without excessive fatigue or increase in pain to demonstrate improved toleration to activity. Baseline: Increased Rt hip pain with gait Goal status: In progress - 09/27/23  3.  Pt will perform tandem and SLS in 3.6 ft holding for up towards 15s to demonstrate improvements in balance ability Baseline: unable to complete on land Goal status: In progress. 09/19/23    LONG TERM GOALS: Target date: 11/03/23  Pt to improve on ODI by 15% ([facility]) to  demonstrate statistically significant Improvement in function. Baseline: 14/50=28% Goal status: INITIAL  2.  Pt will be indep with final HEP's (land and aquatic as appropriate) for continued management of condition Baseline:  Goal status: INITIAL  3.  Pt will report decrease in pain by at least 50% for improved toleration to activity/quality of life and to demonstrate improved management of pain. Baseline:  Goal status: INITIAL  4.  Pt will report standing toleration up towards 30 minutes to prepare a meal or do dishes without limiting pain Baseline: 15 min Max Goal status: INITIAL  5.  Pt will improve on Berg balance test to >/= 43/56  (MDC 35/56) to demonstrate a decrease in fall risk. Baseline: 28/56 Goal status: INITIAL  6.  Pt will improve strength in hips  by at least 10 lbs (HD) to demonstrate improved overall physical function Baseline: see chart Goal status: INITIAL  PLAN:  PT FREQUENCY: 2x/week  PT DURATION: 10 weeks 16 visits  PLANNED INTERVENTIONS: [id number]- PT Re-evaluation, 97110-Therapeutic exercises, 97530- Therapeutic activity, 97112- Neuromuscular re-education, 97535- Self Care, [id number]- Manual therapy, [id number]- Gait training, [phone]- Aquatic Therapy, [phone]- Electrical stimulation (unattended), [phone]- Ionotophoresis 4mg /ml Dexamethasone, Patient/Family education, Balance training, Stair training, Taping, Joint mobilization, DME instructions, Cryotherapy, and Moist heat.  PLAN FOR NEXT SESSION: Aquatic: LE and core strengthening; balance and proprioception retraining, gait toleration   Prentice GORMAN Stains, PT, DPT 10/01/2023, 2:32 PM

## 2023-10-04 ENCOUNTER — Encounter (HOSPITAL_BASED_OUTPATIENT_CLINIC_OR_DEPARTMENT_OTHER): Payer: Self-pay | Admitting: Physical Therapy

## 2023-10-04 ENCOUNTER — Ambulatory Visit (HOSPITAL_BASED_OUTPATIENT_CLINIC_OR_DEPARTMENT_OTHER): Admitting: Physical Therapy

## 2023-10-04 DIAGNOSIS — M5459 Other low back pain: Secondary | ICD-10-CM | POA: Diagnosis not present

## 2023-10-04 DIAGNOSIS — M6281 Muscle weakness (generalized): Secondary | ICD-10-CM | POA: Diagnosis not present

## 2023-10-04 DIAGNOSIS — R2681 Unsteadiness on feet: Secondary | ICD-10-CM | POA: Diagnosis not present

## 2023-10-04 DIAGNOSIS — R2689 Other abnormalities of gait and mobility: Secondary | ICD-10-CM | POA: Diagnosis not present

## 2023-10-04 NOTE — Therapy (Signed)
 OUTPATIENT PHYSICAL THERAPY THORACOLUMBAR TREATMENT   Patient Name: Brittany Oliver MRN: [id number] DOB:09-03-1940, 83 y.o., female Today's Date: 10/04/2023  END OF SESSION:  PT End of Session - 10/04/23 1208     Visit Number 9    Number of Visits 16    Date for PT Re-Evaluation 11/01/23    Authorization Type MCR    Progress Note Due on Visit 10    PT Start Time 1102    PT Stop Time 1142    PT Time Calculation (min) 40 min    Activity Tolerance Patient tolerated treatment well    Behavior During Therapy [facility] for tasks assessed/performed            Past Medical History:  Diagnosis Date   Arthritis    Cancer (HCC) 02/2013   skin melanoma   Heart murmur    Hyperlipidemia    Hypertension    Neuralgia facialis vera    Stroke (HCC) 1987   has a little balance issue since stroke   Past Surgical History:  Procedure Laterality Date   ABDOMINAL HYSTERECTOMY  1974   pt still has her uterus, took only 1 ovary and 1 tube   APPENDECTOMY     COLONOSCOPY     DILATION AND CURETTAGE OF UTERUS  1965   ESOPHAGOGASTRODUODENOSCOPY (EGD) WITH PROPOFOL  Left 01/23/2019   Procedure: ESOPHAGOGASTRODUODENOSCOPY (EGD) WITH PROPOFOL ;  Surgeon: Burnette Fallow, MD;  Location: [facility];  Service: Endoscopy;  Laterality: Left;   EXCISION MELANOMA WITH SENTINEL LYMPH NODE BIOPSY Left 03/27/2013   Procedure: EXCISION MELANOMA LEFT POSTERIOR ARM WITH LEFT AXILLARY SENTINEL LYMPH NODE BIOPSY;  Surgeon: Dann FORBES Hummer, MD;  Location: [facility] OR;  Service: General;  Laterality: Left;   EYE SURGERY Bilateral 2006   cataract and laser surgery   FINGER SURGERY Right    middle finger   MASS EXCISION Left 09/15/2013   Procedure: EXCISION MASS LEFT UPPER ARM;  Surgeon: Dann FORBES Hummer, MD;  Location: [facility] OR;  Service: General;  Laterality: Left;   TONSILLECTOMY     Patient Active Problem List   Diagnosis Date Noted   Secondary hypercoagulable state (HCC) 03/26/2019   Paroxysmal atrial fibrillation  (HCC)    GI bleed 01/22/2019   Hypertension    Hyperlipidemia    Neuralgia facialis vera    COPD mixed type (HCC) 03/02/2015   Dyspnea 12/23/2014   Ex-cigarette smoker 12/23/2014   Seroma complicating a procedure 09/24/2013   Mass of arm 08/26/2013   Atypical facial pain 07/13/2013   Melanoma of left upper arm (HCC) 03/23/2013   Stroke (HCC) 1987   Anemia due to acute blood loss 1987    PCP: Montie Pizza MD  REFERRING PROVIDER: Juliene Bailey MD  REFERRING DIAG: [phone] (ICD-10-CM) - Other spondylosis, lumbar region   Rationale for Evaluation and Treatment: Rehabilitation  THERAPY DIAG:  Other low back pain  Muscle weakness (generalized)  Other abnormalities of gait and mobility  Unsteadiness on feet  ONSET DATE: exacerbation 1 yr  SUBJECTIVE:  SUBJECTIVE STATEMENT: Pt reports did well after land appt; I was surprised that I wasn't sore the next day.   The (Rt) hip pain continues to just be there   POOL ACCESS: Niece has pool in yard, using ~2x/wk   Initial Subjective Exercises about 2 x week walking out door.  My strength and balance are my 2 things I want to improve.  When I was 45 I had a brain stem stroke completely recovered except minor balance issue mostly when I am tired. Rotator nystagmus residual but MD says my brain has overrode it.  PERTINENT HISTORY:  Convex left scoliosis centered at L3  A-fib COPD  PAIN:  Are you having pain? Yes: NPRS scale: current 3/10;  Pain location: Right hip into Rt lateral thigh to knee Pain description: ache Aggravating factors: standing 15 minutes Relieving factors: sitting  PRECAUTIONS: None  RED FLAGS: None   WEIGHT BEARING RESTRICTIONS: No  FALLS:  Has patient fallen in last 6 months? No  LIVING ENVIRONMENT: Lives with:  lives with their spouse Lives in: House/apartment Stairs: Yes: Internal: 16 steps; on right going up main floor with bed and bath 1st floor Has following equipment at home: None  OCCUPATION: retired  PLOF: Independent  PATIENT GOALS: improve my strength and my balance  NEXT MD VISIT: as needed  OBJECTIVE:  Note: Objective measures were completed at Evaluation unless otherwise noted.  DIAGNOSTIC FINDINGS:  MRI Lumbar 11/23 IMPRESSION: 1. Compared with previous MRI from 2019, no acute findings or clear explanation for the patient's symptoms. 2. Chronic multilevel spondylosis associated with a convex left scoliosis, similar to previous study from 2019. 3. Chronic foraminal narrowing on the right at L3-4, bilaterally at L4-5 and on the left at L5-S1. No high-grade spinal stenosis or acute nerve root encroachment.  PATIENT SURVEYS:  Modified Oswestry 14/50=28%   COGNITION: Overall cognitive status: Within functional limits for tasks assessed     SENSATION: WFL  MUSCLE LENGTH: Hamstrings: wfl tested in sitting   POSTURE: rounded shoulders, forward head, increased thoracic kyphosis, and left pelvic obliquity Convex left scoliosis centered at L3   PALPATION: Mild TTP upper lumbar and lower thoracic paraspinals  LUMBAR ROM:   AROM eval  Flexion FT to mid calf P!  Extension Limited 25%  Right lateral flexion full  Left lateral flexion full  Right rotation   Left rotation    (Blank rows = not tested)  LOWER EXTREMITY ROM:     wfl  LOWER EXTREMITY MMT:    MMT Right eval Left eval  Hip flexion 31.1 32.0  Hip extension    Hip abduction 28.5 21.8  Hip adduction    Hip internal rotation    Hip external rotation    Knee flexion    Knee extension 29.7 35.1  Ankle dorsiflexion    Ankle plantarflexion    Ankle inversion    Ankle eversion     (Blank rows = not tested)  LUMBAR SPECIAL TESTS:  Slump test: Negative  FUNCTIONAL TESTS:  5 times sit to stand:  from bench 24.45 using  Timed up and go (TUG): 13.24  BERG Balance Test          Date: eval  Sit to Stand 2  Standing unsupported 4  Sitting with back unsupported but feet supported 4  Stand to sit  2  Transfers  3  Standing unsupported with eyes closed 3  Standing unsupported feet together 1  From standing position, reach forward with outstretched arm 3  From standing position, pick up object from floor 3  From standing position, turn and look behind over each shoulder 3  Turn 360 1  Standing unsupported, alternately place foot on step 1  Standing unsupported, one foot in front 1  Standing on one leg 0  Total:  28    GAIT: Distance walked: 500 ft Assistive device utilized: None Level of assistance: Complete Independence Comments: shorted step length, decreased cadence  [facility] Adult PT Treatment:                                                DATE: 10/04/23 Pt seen for aquatic therapy today.  Treatment took place in water 3.5-4.75 ft in depth at the [facility] pool. Temp of water was 91.  Pt entered/exited the pool via stairs using step-to pattern with bilat hand rail.  *unsupported: walking forward/ backward  in 3.6 ft  * side stepping unsupported with arm add/abdct with rainbow ->yellow hand floats  * suit case carry with single yellow hand float at side, walking backwards / forwards  * SLS with hands on top of water (challenge) * semi-tandem stance with hands out of water * tandem gait forward/ backward with hands resting on water * 3 way LE kick with UE on yellow hand floats 2 x 5 * STS from bench in water with feet on blue step x 10 * alternating toe taps to 5 step in water 2 x 10, hands across chest (improved)  * grapevine Rt/Lt - some irritation to Rt knee afterwards * UE On wall: fig 4 stretch Rt/Lt/Rt 20sec each * Straddling yellow noodle with UE on wall: cycling; -> cycling across pool   TREATMENT  10/01/23 DKTC with heels on green ball 10 x 5 second  holds LTR 10 x 5 second holds Bridge 2 x 10 Standing hip abduction 2 x 10 STS 2 x 10  SLS with frequent UE support 2x 30 second holds Step up 4 inch 1 x 10 Lateral step up and over 4 inch 1 x 10   PATIENT EDUCATION:  Education details: aquatic therapy exercise progressions and modifications Person educated: Patient Education method: Explanation Education comprehension: verbalized understanding  HOME EXERCISE PROGRAM: Access Code: [email or web address] URL: [email or web address] Date: 10/01/2023 Prepared by: Prentice Zaunegger  Exercises - Sit to Stand with Arms Crossed  - 1 x daily - 7 x weekly - 3 sets - 10 reps - Supine Bridge  - 1 x daily - 7 x weekly - 3 sets - 10 reps - Standing Hip Abduction with Counter Support  - 1 x daily - 7 x weekly - 3 sets - 10 reps  ASSESSMENT:  CLINICAL IMPRESSION: Focused on balance exercises in water, that relate to items she scored low on in the Berg balance test.  Continued difficulty with SLS bil.  No change in Rt hip pain during session. Therapist to check goals for 10th visit progress note next visit.  Patient will continue to benefit from physical therapy in order to improve function and reduce impairment.        Initial Impression Patient is a 83 y.o. f who was seen today for physical therapy evaluation and treatment for LBP due to lumbar spondylosis. It is also noted she has a Convex left scoliosis centered at L3 .  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  Exam demonstrates she has bilat hip weakness, core weakness and as per Lars balance is a high fall risk. Her pain level increases in LB with extended times standing/walking.  It will reduce to 0/10 with rest.  No interruptions in sleep. Pt will benefit from skilled physical therapy to improve all areas of deficits which are effecting her functional mobility and decreasing safety with ADL's.  Plan to initiate in  aquatic then transition to land when strength is gained, balance is improved and pain is reduced.  OBJECTIVE IMPAIRMENTS: Abnormal gait, decreased activity tolerance, decreased balance, decreased endurance, decreased mobility, difficulty walking, decreased strength, postural dysfunction, and pain.   ACTIVITY LIMITATIONS: carrying, lifting, bending, standing, squatting, sleeping, stairs, transfers, locomotion level, and caring for others  PARTICIPATION LIMITATIONS: meal prep, cleaning, laundry, shopping, community activity, and yard work  PERSONAL FACTORS: Past/current experiences, Time since onset of injury/illness/exacerbation, and 1 comorbidity: see PmHx are also affecting patient's functional outcome.   REHAB POTENTIAL: Good  CLINICAL DECISION MAKING: Evolving/moderate complexity  EVALUATION COMPLEXITY: Moderate   GOALS: Goals reviewed with patient? Yes  SHORT TERM GOALS: Target date: 09/26/23  Pt will tolerate full aquatic sessions consistently without increase in pain and with improving function to demonstrate good toleration and effectiveness of intervention.  Baseline: Goal status:Met 09/19/23  2.  Pt will tolerate walking to and from setting and engaging in aquatic therapy session without excessive fatigue or increase in pain to demonstrate improved toleration to activity. Baseline: Increased Rt hip pain with gait Goal status: In progress - 09/27/23  3.  Pt will perform tandem and SLS in 3.6 ft holding for up towards 15s to demonstrate improvements in balance ability Baseline: unable to complete on land Goal status: In progress. 09/19/23    LONG TERM GOALS: Target date: 11/03/23  Pt to improve on ODI by 15% (MCID) to demonstrate statistically significant Improvement in function. Baseline: 14/50=28% Goal status: INITIAL  2.  Pt will be indep with final HEP's (land and aquatic as appropriate) for continued management of condition Baseline:  Goal status: INITIAL  3.  Pt  will report decrease in pain by at least 50% for improved toleration to activity/quality of life and to demonstrate improved management of pain. Baseline:  Goal status: INITIAL  4.  Pt will report standing toleration up towards 30 minutes to prepare a meal or do dishes without limiting pain Baseline: 15 min Max Goal status: INITIAL  5.  Pt will improve on Berg balance test to >/= 43/56  (MDC 35/56) to demonstrate a decrease in fall risk. Baseline: 28/56 Goal status: INITIAL  6.  Pt will improve strength in hips by at least 10 lbs (HD) to demonstrate improved overall physical function Baseline: see chart Goal status: INITIAL  PLAN:  PT FREQUENCY: 2x/week  PT DURATION: 10 weeks 16 visits  PLANNED INTERVENTIONS: 97164- PT Re-evaluation, 97110-Therapeutic exercises, 97530- Therapeutic activity, 97112- Neuromuscular re-education, 97535- Self Care, [id number]- Manual therapy, [id number]- Gait training, [phone]- Aquatic Therapy, [phone]- Electrical stimulation (unattended), [phone]- Ionotophoresis 4mg /ml Dexamethasone, Patient/Family education, Balance training, Stair training, Taping, Joint mobilization, DME instructions, Cryotherapy, and Moist heat.  PLAN FOR NEXT SESSION: Aquatic: LE and core strengthening; balance and proprioception retraining, gait toleration  Delon Aquas, PTA 10/04/23 12:08 PM [facility] [address] [place], [place], [address] Phone: [phone]   Fax:  [phone]

## 2023-10-05 NOTE — Therapy (Unsigned)
 OUTPATIENT PHYSICAL THERAPY THORACOLUMBAR TREATMENT/PROGRESS NOTE  Progress Note Reporting Period 08/22/2023 to 10/09/2023  See note below for Objective Data and Assessment of Progress/Goals.       Patient Name: MIMIE GOERING MRN: [id number] DOB:[birth year], 83 y.o., female Today's Date: 10/09/2023  END OF SESSION:  PT End of Session - 10/09/23 1111     Visit Number 10    Number of Visits 16    Date for PT Re-Evaluation 11/01/23    Authorization Type MCR    Progress Note Due on Visit 10    PT Start Time 1057    PT Stop Time 1137    PT Time Calculation (min) 40 min    Activity Tolerance Patient tolerated treatment well    Behavior During Therapy [facility] for tasks assessed/performed             Past Medical History:  Diagnosis Date   Arthritis    Cancer (HCC) 02/2013   skin melanoma   Heart murmur    Hyperlipidemia    Hypertension    Neuralgia facialis vera    Stroke (HCC) 1987   has a little balance issue since stroke   Past Surgical History:  Procedure Laterality Date   ABDOMINAL HYSTERECTOMY  1974   pt still has her uterus, took only 1 ovary and 1 tube   APPENDECTOMY     COLONOSCOPY     DILATION AND CURETTAGE OF UTERUS  1965   ESOPHAGOGASTRODUODENOSCOPY (EGD) WITH PROPOFOL  Left 01/23/2019   Procedure: ESOPHAGOGASTRODUODENOSCOPY (EGD) WITH PROPOFOL ;  Surgeon: Burnette Fallow, MD;  Location: [facility];  Service: Endoscopy;  Laterality: Left;   EXCISION MELANOMA WITH SENTINEL LYMPH NODE BIOPSY Left 03/27/2013   Procedure: EXCISION MELANOMA LEFT POSTERIOR ARM WITH LEFT AXILLARY SENTINEL LYMPH NODE BIOPSY;  Surgeon: Dann FORBES Hummer, MD;  Location: [facility] OR;  Service: General;  Laterality: Left;   EYE SURGERY Bilateral 2006   cataract and laser surgery   FINGER SURGERY Right    middle finger   MASS EXCISION Left 09/15/2013   Procedure: EXCISION MASS LEFT UPPER ARM;  Surgeon: Dann FORBES Hummer, MD;  Location: [facility] OR;  Service: General;  Laterality: Left;    TONSILLECTOMY     Patient Active Problem List   Diagnosis Date Noted   Secondary hypercoagulable state (HCC) 03/26/2019   Paroxysmal atrial fibrillation (HCC)    GI bleed 01/22/2019   Hypertension    Hyperlipidemia    Neuralgia facialis vera    COPD mixed type (HCC) 03/02/2015   Dyspnea 12/23/2014   Ex-cigarette smoker 12/23/2014   Seroma complicating a procedure 09/24/2013   Mass of arm 08/26/2013   Atypical facial pain 07/13/2013   Melanoma of left upper arm (HCC) 03/23/2013   Stroke (HCC) 1987   Anemia due to acute blood loss 1987    PCP: Montie Pizza MD  REFERRING PROVIDER: Juliene Bailey MD  REFERRING DIAG: [phone] (ICD-10-CM) - Other spondylosis, lumbar region   Rationale for Evaluation and Treatment: Rehabilitation  THERAPY DIAG:  Unsteadiness on feet  Other low back pain  Muscle weakness (generalized)  Other abnormalities of gait and mobility  ONSET DATE: exacerbation 1 yr  SUBJECTIVE:  SUBJECTIVE STATEMENT: I had a bad day yesterday. My R knee was hurting me for some reason. I also feel like maybe I did too much with aquatic therapy last session. I felt it for 2 days following.   POOL ACCESS: Niece has pool in yard, using ~2x/wk   Initial Subjective Exercises about 2 x week walking out door.  My strength and balance are my 2 things I want to improve.  When I was 45 I had a brain stem stroke completely recovered except minor balance issue mostly when I am tired. Rotator nystagmus residual but MD says my brain has overrode it.  PERTINENT HISTORY:  Convex left scoliosis centered at L3  A-fib COPD  PAIN:  Are you having pain? Yes: NPRS scale: current 3/10;  Pain location: Right hip into Rt lateral thigh to knee Pain description: ache Aggravating factors: standing 15  minutes Relieving factors: sitting  PRECAUTIONS: None  RED FLAGS: None   WEIGHT BEARING RESTRICTIONS: No  FALLS:  Has patient fallen in last 6 months? No  LIVING ENVIRONMENT: Lives with: lives with their spouse Lives in: House/apartment Stairs: Yes: Internal: 16 steps; on right going up main floor with bed and bath 1st floor Has following equipment at home: None  OCCUPATION: retired  PLOF: Independent  PATIENT GOALS: improve my strength and my balance  NEXT MD VISIT: as needed  OBJECTIVE:  Note: Objective measures were completed at Evaluation unless otherwise noted.  DIAGNOSTIC FINDINGS:  MRI Lumbar 11/23 IMPRESSION: 1. Compared with previous MRI from 2019, no acute findings or clear explanation for the patient's symptoms. 2. Chronic multilevel spondylosis associated with a convex left scoliosis, similar to previous study from 2019. 3. Chronic foraminal narrowing on the right at L3-4, bilaterally at L4-5 and on the left at L5-S1. No high-grade spinal stenosis or acute nerve root encroachment.  PATIENT SURVEYS:  Modified Oswestry 14/50=28%   COGNITION: Overall cognitive status: Within functional limits for tasks assessed     SENSATION: WFL  MUSCLE LENGTH: Hamstrings: wfl tested in sitting   POSTURE: rounded shoulders, forward head, increased thoracic kyphosis, and left pelvic obliquity Convex left scoliosis centered at L3   PALPATION: Mild TTP upper lumbar and lower thoracic paraspinals  LUMBAR ROM:   AROM eval  Flexion FT to mid calf P!  Extension Limited 25%  Right lateral flexion full  Left lateral flexion full  Right rotation   Left rotation    (Blank rows = not tested)  LOWER EXTREMITY ROM:     wfl  LOWER EXTREMITY MMT:    MMT Right eval Left eval  Hip flexion 31.1 32.0  Hip extension    Hip abduction 28.5 21.8  Hip adduction    Hip internal rotation    Hip external rotation    Knee flexion    Knee extension 29.7 35.1  Ankle  dorsiflexion    Ankle plantarflexion    Ankle inversion    Ankle eversion     (Blank rows = not tested)  LUMBAR SPECIAL TESTS:  Slump test: Negative  FUNCTIONAL TESTS:  5 times sit to stand: from bench 24.45 using  Timed up and go (TUG): 13.24  BERG Balance Test          Date: eval    10/09/2023  Sit to Stand 2 4  Standing unsupported 4 4  Sitting with back unsupported but feet supported 4 4  Stand to sit  2 4  Transfers  3 4  Standing unsupported with eyes closed  3 4  Standing unsupported feet together 1 4  From standing position, reach forward with outstretched arm 3 3  From standing position, pick up object from floor 3 3  From standing position, turn and look behind over each shoulder 3 3  Turn 360 1 3  Standing unsupported, alternately place foot on step 1 4  Standing unsupported, one foot in front 1 1  Standing on one leg 0 1  Total:  28 46    GAIT: Distance walked: 500 ft Assistive device utilized: None Level of assistance: Complete Independence Comments: shorted step length, decreased cadence  [facility] Adult PT Treatment:                                                DATE:  07/23 DKTC with heels on green ball 10 x 5 second holds LTR with heels on green ball 10 x 5 second holds Bridge 2 x 10 Supine clams with RTB  BERG balance screen  Standing hip abduction 2 x 10 SLS with frequent UE support 2x 30 second holds Stranding quad stretch at counter 2x 30 second holds.   10/04/23 Pt seen for aquatic therapy today.  Treatment took place in water 3.5-4.75 ft in depth at the [facility] pool. Temp of water was 91.  Pt entered/exited the pool via stairs using step-to pattern with bilat hand rail.  *unsupported: walking forward/ backward  in 3.6 ft  * side stepping unsupported with arm add/abdct with rainbow ->yellow hand floats  * suit case carry with single yellow hand float at side, walking backwards / forwards  * SLS with hands on top of water  (challenge) * semi-tandem stance with hands out of water * tandem gait forward/ backward with hands resting on water * 3 way LE kick with UE on yellow hand floats 2 x 5 * STS from bench in water with feet on blue step x 10 * alternating toe taps to 5 step in water 2 x 10, hands across chest (improved)  * grapevine Rt/Lt - some irritation to Rt knee afterwards * UE On wall: fig 4 stretch Rt/Lt/Rt 20sec each * Straddling yellow noodle with UE on wall: cycling; -> cycling across pool   TREATMENT  10/01/23 DKTC with heels on green ball 10 x 5 second holds LTR 10 x 5 second holds Bridge 2 x 10 Standing hip abduction 2 x 10 STS 2 x 10  SLS with frequent UE support 2x 30 second holds Step up 4 inch 1 x 10 Lateral step up and over 4 inch 1 x 10   PATIENT EDUCATION:  Education details: aquatic therapy exercise progressions and modifications Person educated: Patient Education method: Explanation Education comprehension: verbalized understanding  HOME EXERCISE PROGRAM: Access Code: [email or web address] URL: [email or web address] Date: 10/01/2023 Prepared by: Prentice Zaunegger  Exercises - Sit to Stand with Arms Crossed  - 1 x daily - 7 x weekly - 3 sets - 10 reps - Supine Bridge  - 1 x daily - 7 x weekly - 3 sets - 10 reps - Standing Hip Abduction with Counter Support  - 1 x daily - 7 x weekly - 3 sets - 10 reps  ASSESSMENT:  CLINICAL IMPRESSION: Pt has made 50% improvements per subjective reports. She feels as though she is she has gotten stronger, and her balance has improved. She continues to  have pain, and notes good days and bad. As noted by normative values, pt has made significant improvements in her balance with most difficulty noted with SLS and tandem stance. She will continue to benefit from skilled PT to address continued deficits.    Initial Impression Patient is a 83 y.o. f who was seen today for physical therapy evaluation and treatment for LBP due to lumbar  spondylosis. It is also noted she has a Convex left scoliosis centered at L3 .  She is an active senior and reports reduction in mobility recently partly due to back pain and fear of falling.  Exam demonstrates she has bilat hip weakness, core weakness and as per Lars balance is a high fall risk. Her pain level increases in LB with extended times standing/walking.  It will reduce to 0/10 with rest.  No interruptions in sleep. Pt will benefit from skilled physical therapy to improve all areas of deficits which are effecting her functional mobility and decreasing safety with ADL's.  Plan to initiate in aquatic then transition to land when strength is gained, balance is improved and pain is reduced.  OBJECTIVE IMPAIRMENTS: Abnormal gait, decreased activity tolerance, decreased balance, decreased endurance, decreased mobility, difficulty walking, decreased strength, postural dysfunction, and pain.   ACTIVITY LIMITATIONS: carrying, lifting, bending, standing, squatting, sleeping, stairs, transfers, locomotion level, and caring for others  PARTICIPATION LIMITATIONS: meal prep, cleaning, laundry, shopping, community activity, and yard work  PERSONAL FACTORS: Past/current experiences, Time since onset of injury/illness/exacerbation, and 1 comorbidity: see PmHx are also affecting patient's functional outcome.   REHAB POTENTIAL: Good  CLINICAL DECISION MAKING: Evolving/moderate complexity  EVALUATION COMPLEXITY: Moderate   GOALS: Goals reviewed with patient? Yes  SHORT TERM GOALS: Target date: 09/26/23  Pt will tolerate full aquatic sessions consistently without increase in pain and with improving function to demonstrate good toleration and effectiveness of intervention.  Baseline: Goal status:Met 09/19/23  2.  Pt will tolerate walking to and from setting and engaging in aquatic therapy session without excessive fatigue or increase in pain to demonstrate improved toleration to activity. Baseline:  Increased Rt hip pain with gait Goal status: MET 10/09/2023  3.  Pt will perform tandem and SLS in 3.6 ft holding for up towards 15s to demonstrate improvements in balance ability Baseline: unable to complete on land Goal status: In progress. 09/19/23    LONG TERM GOALS: Target date: 11/03/23  Pt to improve on ODI by 15% ([facility]) to demonstrate statistically significant Improvement in function. Baseline: 14/50=28% Goal status: PROGRESSING 10/09/2023  Oswestry Score: 12 / 50 or 24 %  2.  Pt will be indep with final HEP's (land and aquatic as appropriate) for continued management of condition Baseline:  Goal status: MET 10/09/2023  3.  Pt will report decrease in pain by at least 50% for improved toleration to activity/quality of life and to demonstrate improved management of pain. Baseline:  Goal status: MET 1/10 10/09/2023  4.  Pt will report standing toleration up towards 30 minutes to prepare a meal or do dishes without limiting pain Baseline: 15 min Max Goal status: MET 10/09/2023  5.  Pt will improve on Berg balance test to >/= 43/56  (MDC 35/56) to demonstrate a decrease in fall risk. Baseline: 28/56 Goal status: MET 10/09/2023  6.  Pt will improve strength in hips by at least 10 lbs (HD) to demonstrate improved overall physical function Baseline: see chart Goal status: INITIAL  7. Pt will be able to perform SLS for >10 seconds  to improve balance.  Baseline: see chart Goal status: NEW PLAN:  PT FREQUENCY: 2x/week  PT DURATION: 10 weeks 16 visits  PLANNED INTERVENTIONS: 97164- PT Re-evaluation, 97110-Therapeutic exercises, 97530- Therapeutic activity, 97112- Neuromuscular re-education, 97535- Self Care, [id number]- Manual therapy, [phone]- Gait training, [phone]- Aquatic Therapy, [phone]- Electrical stimulation (unattended), [phone]- Ionotophoresis 4mg /ml Dexamethasone, Patient/Family education, Balance training, Stair training, Taping, Joint mobilization, DME instructions,  Cryotherapy, and Moist heat.  PLAN FOR NEXT SESSION: Aquatic: LE and core strengthening; balance and proprioception retraining, gait toleration  Rojean Batten PT, DPT 10/09/23  11:34 AM

## 2023-10-09 ENCOUNTER — Ambulatory Visit (HOSPITAL_BASED_OUTPATIENT_CLINIC_OR_DEPARTMENT_OTHER): Admitting: Physical Therapy

## 2023-10-09 ENCOUNTER — Encounter (HOSPITAL_BASED_OUTPATIENT_CLINIC_OR_DEPARTMENT_OTHER): Payer: Self-pay | Admitting: Physical Therapy

## 2023-10-09 DIAGNOSIS — R2689 Other abnormalities of gait and mobility: Secondary | ICD-10-CM

## 2023-10-09 DIAGNOSIS — M5459 Other low back pain: Secondary | ICD-10-CM | POA: Diagnosis not present

## 2023-10-09 DIAGNOSIS — R2681 Unsteadiness on feet: Secondary | ICD-10-CM

## 2023-10-09 DIAGNOSIS — M6281 Muscle weakness (generalized): Secondary | ICD-10-CM | POA: Diagnosis not present

## 2023-10-11 ENCOUNTER — Encounter (HOSPITAL_BASED_OUTPATIENT_CLINIC_OR_DEPARTMENT_OTHER): Payer: Self-pay | Admitting: Physical Therapy

## 2023-10-11 ENCOUNTER — Ambulatory Visit (HOSPITAL_BASED_OUTPATIENT_CLINIC_OR_DEPARTMENT_OTHER): Admitting: Physical Therapy

## 2023-10-11 DIAGNOSIS — R2689 Other abnormalities of gait and mobility: Secondary | ICD-10-CM | POA: Diagnosis not present

## 2023-10-11 DIAGNOSIS — M5459 Other low back pain: Secondary | ICD-10-CM

## 2023-10-11 DIAGNOSIS — M6281 Muscle weakness (generalized): Secondary | ICD-10-CM

## 2023-10-11 DIAGNOSIS — R2681 Unsteadiness on feet: Secondary | ICD-10-CM

## 2023-10-11 NOTE — Therapy (Signed)
 OUTPATIENT PHYSICAL THERAPY THORACOLUMBAR TREATMENT Patient Name: Brittany Oliver MRN: [id number] DOB:1940/05/10, 83 y.o., female Today's Date: 10/11/2023  END OF SESSION:  PT End of Session - 10/11/23 1056     Visit Number 11    Number of Visits 16    Date for PT Re-Evaluation 11/01/23    Authorization Type MCR    Progress Note Due on Visit 20    PT Start Time 1045    PT Stop Time 1125    PT Time Calculation (min) 40 min    Activity Tolerance Patient tolerated treatment well    Behavior During Therapy [facility] for tasks assessed/performed             Past Medical History:  Diagnosis Date   Arthritis    Cancer (HCC) 02/2013   skin melanoma   Heart murmur    Hyperlipidemia    Hypertension    Neuralgia facialis vera    Stroke (HCC) 1987   has a little balance issue since stroke   Past Surgical History:  Procedure Laterality Date   ABDOMINAL HYSTERECTOMY  1974   pt still has her uterus, took only 1 ovary and 1 tube   APPENDECTOMY     COLONOSCOPY     DILATION AND CURETTAGE OF UTERUS  1965   ESOPHAGOGASTRODUODENOSCOPY (EGD) WITH PROPOFOL  Left 01/23/2019   Procedure: ESOPHAGOGASTRODUODENOSCOPY (EGD) WITH PROPOFOL ;  Surgeon: Burnette Fallow, MD;  Location: [facility];  Service: Endoscopy;  Laterality: Left;   EXCISION MELANOMA WITH SENTINEL LYMPH NODE BIOPSY Left 03/27/2013   Procedure: EXCISION MELANOMA LEFT POSTERIOR ARM WITH LEFT AXILLARY SENTINEL LYMPH NODE BIOPSY;  Surgeon: Dann FORBES Hummer, MD;  Location: [facility] OR;  Service: General;  Laterality: Left;   EYE SURGERY Bilateral 2006   cataract and laser surgery   FINGER SURGERY Right    middle finger   MASS EXCISION Left 09/15/2013   Procedure: EXCISION MASS LEFT UPPER ARM;  Surgeon: Dann FORBES Hummer, MD;  Location: [facility] OR;  Service: General;  Laterality: Left;   TONSILLECTOMY     Patient Active Problem List   Diagnosis Date Noted   Secondary hypercoagulable state (HCC) 03/26/2019   Paroxysmal atrial fibrillation  (HCC)    GI bleed 01/22/2019   Hypertension    Hyperlipidemia    Neuralgia facialis vera    COPD mixed type (HCC) 03/02/2015   Dyspnea 12/23/2014   Ex-cigarette smoker 12/23/2014   Seroma complicating a procedure 09/24/2013   Mass of arm 08/26/2013   Atypical facial pain 07/13/2013   Melanoma of left upper arm (HCC) 03/23/2013   Stroke (HCC) 1987   Anemia due to acute blood loss 1987    PCP: Montie Pizza MD  REFERRING PROVIDER: Juliene Bailey MD  REFERRING DIAG: [id number] (ICD-10-CM) - Other spondylosis, lumbar region   Rationale for Evaluation and Treatment: Rehabilitation  THERAPY DIAG:  Unsteadiness on feet  Other low back pain  Muscle weakness (generalized)  Other abnormalities of gait and mobility  ONSET DATE: exacerbation 1 yr  SUBJECTIVE:  SUBJECTIVE STATEMENT: Pt reports she feels good. The stretches she had me do and the massage she did, helped.   Pt reports she bought 2-2 noodles to use at her niece's pool. She reports that the yellow hand floats were too much last session.    POOL ACCESS: Niece has pool in yard, using ~2x/wk   Initial Subjective Exercises about 2 x week walking out door.  My strength and balance are my 2 things I want to improve.  When I was 45 I had a brain stem stroke completely recovered except minor balance issue mostly when I am tired. Rotator nystagmus residual but MD says my brain has overrode it.  PERTINENT HISTORY:  Convex left scoliosis centered at L3  A-fib COPD  PAIN:  Are you having pain? Yes: NPRS scale: current 3/10;  Pain location: Right hip into Rt lateral thigh to knee Pain description: ache Aggravating factors: standing 15 minutes Relieving factors: sitting  PRECAUTIONS: None  RED FLAGS: None   WEIGHT BEARING RESTRICTIONS:  No  FALLS:  Has patient fallen in last 6 months? No  LIVING ENVIRONMENT: Lives with: lives with their spouse Lives in: House/apartment Stairs: Yes: Internal: 16 steps; on right going up main floor with bed and bath 1st floor Has following equipment at home: None  OCCUPATION: retired  PLOF: Independent  PATIENT GOALS: improve my strength and my balance  NEXT MD VISIT: as needed  OBJECTIVE:  Note: Objective measures were completed at Evaluation unless otherwise noted.  DIAGNOSTIC FINDINGS:  MRI Lumbar 11/23 IMPRESSION: 1. Compared with previous MRI from 2019, no acute findings or clear explanation for the patient's symptoms. 2. Chronic multilevel spondylosis associated with a convex left scoliosis, similar to previous study from 2019. 3. Chronic foraminal narrowing on the right at L3-4, bilaterally at L4-5 and on the left at L5-S1. No high-grade spinal stenosis or acute nerve root encroachment.  PATIENT SURVEYS:  Modified Oswestry 14/50=28%   COGNITION: Overall cognitive status: Within functional limits for tasks assessed     SENSATION: WFL  MUSCLE LENGTH: Hamstrings: wfl tested in sitting   POSTURE: rounded shoulders, forward head, increased thoracic kyphosis, and left pelvic obliquity Convex left scoliosis centered at L3   PALPATION: Mild TTP upper lumbar and lower thoracic paraspinals  LUMBAR ROM:   AROM eval  Flexion FT to mid calf P!  Extension Limited 25%  Right lateral flexion full  Left lateral flexion full  Right rotation   Left rotation    (Blank rows = not tested)  LOWER EXTREMITY ROM:     wfl  LOWER EXTREMITY MMT:    MMT Right eval Left eval  Hip flexion 31.1 32.0  Hip extension    Hip abduction 28.5 21.8  Hip adduction    Hip internal rotation    Hip external rotation    Knee flexion    Knee extension 29.7 35.1  Ankle dorsiflexion    Ankle plantarflexion    Ankle inversion    Ankle eversion     (Blank rows = not  tested)  LUMBAR SPECIAL TESTS:  Slump test: Negative  FUNCTIONAL TESTS:  5 times sit to stand: from bench 24.45 using  Timed up and go (TUG): 13.24  BERG Balance Test          Date: eval    10/09/2023  Sit to Stand 2 4  Standing unsupported 4 4  Sitting with back unsupported but feet supported 4 4  Stand to sit  2 4  Transfers  3 4  Standing unsupported with eyes closed 3 4  Standing unsupported feet together 1 4  From standing position, reach forward with outstretched arm 3 3  From standing position, pick up object from floor 3 3  From standing position, turn and look behind over each shoulder 3 3  Turn 360 1 3  Standing unsupported, alternately place foot on step 1 4  Standing unsupported, one foot in front 1 1  Standing on one leg 0 1  Total:  28 46    GAIT: Distance walked: 500 ft Assistive device utilized: None Level of assistance: Complete Independence Comments: shorted step length, decreased cadence  [facility] Adult PT Treatment:                                   DATE:   10/11/23 Pt seen for aquatic therapy today.  Treatment took place in water 3.5-4.75 ft in depth at the [facility] pool. Temp of water was 91.  Pt entered/exited the pool via stairs using step-to pattern with bilat hand rail.  *unsupported: walking forward/ backward  in 3.6 ft, multiple laps  * suit case carry with bil rainbow hand float at side, walking backwards / forwards  * side stepping with arm add/abdct with short hollow noodles x 2  * 3 way LE kick with UE on short hollow noodles 2 x 5 * hollow noodle stomp with UE on rainbow hand floats-> light touch on wall x 10 each LE * standing balance on hollow noodle with light touch on wall (challenge)-> mini squats -> slow marching * SLS with opp hand holding hollow noodle under water 2 x 10sec each LE * Straddling yellow noodle cycling across pool * standing quad stretch with ankle supported by hollow noodle x 20s   07/23 DKTC with heels  on green ball 10 x 5 second holds LTR with heels on green ball 10 x 5 second holds Bridge 2 x 10 Supine clams with RTB  BERG balance screen  Standing hip abduction 2 x 10 SLS with frequent UE support 2x 30 second holds Stranding quad stretch at counter 2x 30 second holds.   10/04/23 Pt seen for aquatic therapy today.  Treatment took place in water 3.5-4.75 ft in depth at the [facility] pool. Temp of water was 91.  Pt entered/exited the pool via stairs using step-to pattern with bilat hand rail.  *unsupported: walking forward/ backward  in 3.6 ft  * side stepping unsupported with arm add/abdct with rainbow ->yellow hand floats  * suit case carry with single yellow hand float at side, walking backwards / forwards  * SLS with hands on top of water (challenge) * semi-tandem stance with hands out of water * tandem gait forward/ backward with hands resting on water * 3 way LE kick with UE on yellow hand floats 2 x 5 * STS from bench in water with feet on blue step x 10 * alternating toe taps to 5 step in water 2 x 10, hands across chest (improved)  * grapevine Rt/Lt - some irritation to Rt knee afterwards * UE On wall: fig 4 stretch Rt/Lt/Rt 20sec each * Straddling yellow noodle with UE on wall: cycling; -> cycling across pool   TREATMENT  10/01/23 DKTC with heels on green ball 10 x 5 second holds LTR 10 x 5 second holds Bridge 2 x 10 Standing hip abduction 2 x 10 STS  2 x 10  SLS with frequent UE support 2x 30 second holds Step up 4 inch 1 x 10 Lateral step up and over 4 inch 1 x 10   PATIENT EDUCATION:  Education details: aquatic therapy exercise progressions and modifications Person educated: Patient Education method: Explanation Education comprehension: verbalized understanding  HOME EXERCISE PROGRAM: Land Access Code: [email or web address] URL: [email or web address]  Aquatic Access Code: [email or web address] URL: [email or web address] This aquatic home  exercise program from [employer] utilizes pictures from land based exercises, but has been adapted prior to lamination and issuance.  * not issued yet  ASSESSMENT:  CLINICAL IMPRESSION: Pt tolerated session well with minimal symptoms in Rt knee or hip. Reduced UE resistance with good response.  Included new balance challenges with varied level of UE support.  Began creating aquatic HEP; will continue to update and modify HEP depending on response.  Therapist on land to check MMT as time allows next session.  Pt will continue to benefit from skilled PT to address continued deficits.    Initial Impression Patient is a 83 y.o. f who was seen today for physical therapy evaluation and treatment for LBP due to lumbar spondylosis. It is also noted she has a Convex left scoliosis centered at L3 .  She is an active senior and reports reduction in mobility recently partly due to back pain and fear of falling.  Exam demonstrates she has bilat hip weakness, core weakness and as per Lars balance is a high fall risk. Her pain level increases in LB with extended times standing/walking.  It will reduce to 0/10 with rest.  No interruptions in sleep. Pt will benefit from skilled physical therapy to improve all areas of deficits which are effecting her functional mobility and decreasing safety with ADL's.  Plan to initiate in aquatic then transition to land when strength is gained, balance is improved and pain is reduced.  OBJECTIVE IMPAIRMENTS: Abnormal gait, decreased activity tolerance, decreased balance, decreased endurance, decreased mobility, difficulty walking, decreased strength, postural dysfunction, and pain.   ACTIVITY LIMITATIONS: carrying, lifting, bending, standing, squatting, sleeping, stairs, transfers, locomotion level, and caring for others  PARTICIPATION LIMITATIONS: meal prep, cleaning, laundry, shopping, community activity, and yard work  PERSONAL FACTORS: Past/current experiences, Time since  onset of injury/illness/exacerbation, and 1 comorbidity: see PmHx are also affecting patient's functional outcome.   REHAB POTENTIAL: Good  CLINICAL DECISION MAKING: Evolving/moderate complexity  EVALUATION COMPLEXITY: Moderate   GOALS: Goals reviewed with patient? Yes  SHORT TERM GOALS: Target date: 09/26/23  Pt will tolerate full aquatic sessions consistently without increase in pain and with improving function to demonstrate good toleration and effectiveness of intervention.  Baseline: Goal status:Met 09/19/23  2.  Pt will tolerate walking to and from setting and engaging in aquatic therapy session without excessive fatigue or increase in pain to demonstrate improved toleration to activity. Baseline: Increased Rt hip pain with gait Goal status: MET 10/09/2023  3.  Pt will perform tandem and SLS in 3.6 ft holding for up towards 15s to demonstrate improvements in balance ability Baseline: unable to complete on land Goal status: In progress. 10/11/23    LONG TERM GOALS: Target date: 11/03/23  Pt to improve on ODI by 15% ([facility]) to demonstrate statistically significant Improvement in function. Baseline: 14/50=28% Goal status: PROGRESSING 10/09/2023  Oswestry Score: 12 / 50 or 24 %  2.  Pt will be indep with final HEP's (land and aquatic as appropriate) for continued management of condition Baseline:  Goal status: MET 10/09/2023  3.  Pt will report decrease in pain by at least 50% for improved toleration to activity/quality of life and to demonstrate improved management of pain. Baseline:  Goal status: MET 1/10 10/09/2023  4.  Pt will report standing toleration up towards 30 minutes to prepare a meal or do dishes without limiting pain Baseline: 15 min Max Goal status: MET 10/09/2023  5.  Pt will improve on Berg balance test to >/= 43/56  (MDC 35/56) to demonstrate a decrease in fall risk. Baseline: 28/56 Goal status: MET 10/09/2023  6.  Pt will improve strength in hips by  at least 10 lbs (HD) to demonstrate improved overall physical function Baseline: see chart Goal status: INITIAL  7. Pt will be able to perform SLS for >10 seconds to improve balance.  Baseline: see chart Goal status: NEW PLAN:  PT FREQUENCY: 2x/week  PT DURATION: 10 weeks 16 visits  PLANNED INTERVENTIONS: 97164- PT Re-evaluation, 97110-Therapeutic exercises, 97530- Therapeutic activity, 97112- Neuromuscular re-education, 97535- Self Care, 02859- Manual therapy, [phone]- Gait training, [phone]- Aquatic Therapy, [phone]- Electrical stimulation (unattended), [phone]- Ionotophoresis 4mg /ml Dexamethasone, Patient/Family education, Balance training, Stair training, Taping, Joint mobilization, DME instructions, Cryotherapy, and Moist heat.  PLAN FOR NEXT SESSION: Aquatic: LE and core strengthening; balance and proprioception retraining, gait toleration  Delon Aquas, PTA 10/11/23 12:39 PM [facility] [address] [place], [place], [address] Phone: [phone]   Fax:  [phone]

## 2023-10-16 ENCOUNTER — Ambulatory Visit (HOSPITAL_BASED_OUTPATIENT_CLINIC_OR_DEPARTMENT_OTHER)

## 2023-10-16 ENCOUNTER — Encounter (HOSPITAL_BASED_OUTPATIENT_CLINIC_OR_DEPARTMENT_OTHER): Payer: Self-pay

## 2023-10-16 DIAGNOSIS — R2689 Other abnormalities of gait and mobility: Secondary | ICD-10-CM

## 2023-10-16 DIAGNOSIS — R2681 Unsteadiness on feet: Secondary | ICD-10-CM

## 2023-10-16 DIAGNOSIS — M6281 Muscle weakness (generalized): Secondary | ICD-10-CM | POA: Diagnosis not present

## 2023-10-16 DIAGNOSIS — M5459 Other low back pain: Secondary | ICD-10-CM

## 2023-10-16 NOTE — Therapy (Signed)
 OUTPATIENT PHYSICAL THERAPY THORACOLUMBAR TREATMENT Patient Name: Brittany Oliver MRN: [id number] DOB:04/15/1940, 83 y.o., female Today's Date: 10/16/2023  END OF SESSION:  PT End of Session - 10/16/23 1132     Visit Number 12    Number of Visits 16    Date for PT Re-Evaluation 11/01/23    Authorization Type MCR    Progress Note Due on Visit 20    PT Start Time 1101    PT Stop Time 1145    PT Time Calculation (min) 44 min    Activity Tolerance Patient tolerated treatment well    Behavior During Therapy [facility] for tasks assessed/performed              Past Medical History:  Diagnosis Date   Arthritis    Cancer (HCC) 02/2013   skin melanoma   Heart murmur    Hyperlipidemia    Hypertension    Neuralgia facialis vera    Stroke (HCC) 1987   has a little balance issue since stroke   Past Surgical History:  Procedure Laterality Date   ABDOMINAL HYSTERECTOMY  1974   pt still has her uterus, took only 1 ovary and 1 tube   APPENDECTOMY     COLONOSCOPY     DILATION AND CURETTAGE OF UTERUS  1965   ESOPHAGOGASTRODUODENOSCOPY (EGD) WITH PROPOFOL  Left 01/23/2019   Procedure: ESOPHAGOGASTRODUODENOSCOPY (EGD) WITH PROPOFOL ;  Surgeon: Burnette Fallow, MD;  Location: [facility];  Service: Endoscopy;  Laterality: Left;   EXCISION MELANOMA WITH SENTINEL LYMPH NODE BIOPSY Left 03/27/2013   Procedure: EXCISION MELANOMA LEFT POSTERIOR ARM WITH LEFT AXILLARY SENTINEL LYMPH NODE BIOPSY;  Surgeon: Dann FORBES Hummer, MD;  Location: [facility] OR;  Service: General;  Laterality: Left;   EYE SURGERY Bilateral 2006   cataract and laser surgery   FINGER SURGERY Right    middle finger   MASS EXCISION Left 09/15/2013   Procedure: EXCISION MASS LEFT UPPER ARM;  Surgeon: Dann FORBES Hummer, MD;  Location: [facility] OR;  Service: General;  Laterality: Left;   TONSILLECTOMY     Patient Active Problem List   Diagnosis Date Noted   Secondary hypercoagulable state (HCC) 03/26/2019   Paroxysmal atrial fibrillation  (HCC)    GI bleed 01/22/2019   Hypertension    Hyperlipidemia    Neuralgia facialis vera    COPD mixed type (HCC) 03/02/2015   Dyspnea 12/23/2014   Ex-cigarette smoker 12/23/2014   Seroma complicating a procedure 09/24/2013   Mass of arm 08/26/2013   Atypical facial pain 07/13/2013   Melanoma of left upper arm (HCC) 03/23/2013   Stroke (HCC) 1987   Anemia due to acute blood loss 1987    PCP: Montie Pizza MD  REFERRING PROVIDER: Juliene Bailey MD  REFERRING DIAG: [phone] (ICD-10-CM) - Other spondylosis, lumbar region   Rationale for Evaluation and Treatment: Rehabilitation  THERAPY DIAG:  Unsteadiness on feet  Other low back pain  Muscle weakness (generalized)  Other abnormalities of gait and mobility  ONSET DATE: exacerbation 1 yr  SUBJECTIVE:  SUBJECTIVE STATEMENT: Pt reports she had a couple days of knee and leg pain but seems better today. They say it's related to my back. Walked with her husband yesterday and got some pool time this week. 1-2/10 pain level in R knee at entry.   POOL ACCESS: Niece has pool in yard, using ~2x/wk   Initial Subjective Exercises about 2 x week walking out door.  My strength and balance are my 2 things I want to improve.  When I was 45 I had a brain stem stroke completely recovered except minor balance issue mostly when I am tired. Rotator nystagmus residual but MD says my brain has overrode it.  PERTINENT HISTORY:  Convex left scoliosis centered at L3  A-fib COPD  PAIN:  Are you having pain? Yes: NPRS scale: current 1-2/10;  Pain location: Right hip into Rt lateral thigh to knee Pain description: ache Aggravating factors: standing 15 minutes Relieving factors: sitting  PRECAUTIONS: None  RED FLAGS: None   WEIGHT BEARING RESTRICTIONS:  No  FALLS:  Has patient fallen in last 6 months? No  LIVING ENVIRONMENT: Lives with: lives with their spouse Lives in: House/apartment Stairs: Yes: Internal: 16 steps; on right going up main floor with bed and bath 1st floor Has following equipment at home: None  OCCUPATION: retired  PLOF: Independent  PATIENT GOALS: improve my strength and my balance  NEXT MD VISIT: as needed  OBJECTIVE:  Note: Objective measures were completed at Evaluation unless otherwise noted.  DIAGNOSTIC FINDINGS:  MRI Lumbar 11/23 IMPRESSION: 1. Compared with previous MRI from 2019, no acute findings or clear explanation for the patient's symptoms. 2. Chronic multilevel spondylosis associated with a convex left scoliosis, similar to previous study from 2019. 3. Chronic foraminal narrowing on the right at L3-4, bilaterally at L4-5 and on the left at L5-S1. No high-grade spinal stenosis or acute nerve root encroachment.  PATIENT SURVEYS:  Modified Oswestry 14/50=28%   COGNITION: Overall cognitive status: Within functional limits for tasks assessed     SENSATION: WFL  MUSCLE LENGTH: Hamstrings: wfl tested in sitting   POSTURE: rounded shoulders, forward head, increased thoracic kyphosis, and left pelvic obliquity Convex left scoliosis centered at L3   PALPATION: Mild TTP upper lumbar and lower thoracic paraspinals  LUMBAR ROM:   AROM eval  Flexion FT to mid calf P!  Extension Limited 25%  Right lateral flexion full  Left lateral flexion full  Right rotation   Left rotation    (Blank rows = not tested)  LOWER EXTREMITY ROM:     wfl  LOWER EXTREMITY MMT:    MMT Right eval Left eval Right 7/30 Left  7/30  Hip flexion 31.1 32.0 39.8 40.0  Hip extension      Hip abduction 28.5 21.8 28.9 (s/l) 37.1 (s/l)  Hip adduction      Hip internal rotation      Hip external rotation      Knee flexion      Knee extension 29.7 35.1 37.0 32.0 (had cramping in leg)  Ankle dorsiflexion       Ankle plantarflexion      Ankle inversion      Ankle eversion       (Blank rows = not tested)  LUMBAR SPECIAL TESTS:  Slump test: Negative  FUNCTIONAL TESTS:  5 times sit to stand: from bench 24.45 using  Timed up and go (TUG): 13.24  BERG Balance Test          Date: eval  10/09/2023  Sit to Stand 2 4  Standing unsupported 4 4  Sitting with back unsupported but feet supported 4 4  Stand to sit  2 4  Transfers  3 4  Standing unsupported with eyes closed 3 4  Standing unsupported feet together 1 4  From standing position, reach forward with outstretched arm 3 3  From standing position, pick up object from floor 3 3  From standing position, turn and look behind over each shoulder 3 3  Turn 360 1 3  Standing unsupported, alternately place foot on step 1 4  Standing unsupported, one foot in front 1 1  Standing on one leg 0 1  Total:  28 46    GAIT: Distance walked: 500 ft Assistive device utilized: None Level of assistance: Complete Independence Comments: shorted step length, decreased cadence  [facility] Adult PT Treatment:                                   DATE:   07/30 MMT testing DKTC with heels on green ball 20 x 5 second holds LTR with heels on green ball 10 x 5 second holds Bridge 2 x 10 Supine clams with GTB 2x15 Sit to stands from elevated plinth 2x10 Standing HR/TR 2x10 Standing hip abduction 1# 2 x 10 SLS with frequent UE support 2x 30 second holds Romberg on airex with CGA 2x30 seconds Modified tandem stance x30sec ea on floor SBA/CGA Recumbent bike L3 x57min   10/11/23 Pt seen for aquatic therapy today.  Treatment took place in water 3.5-4.75 ft in depth at the [facility] pool. Temp of water was 91.  Pt entered/exited the pool via stairs using step-to pattern with bilat hand rail.  *unsupported: walking forward/ backward  in 3.6 ft, multiple laps  * suit case carry with bil rainbow hand float at side, walking backwards / forwards  * side  stepping with arm add/abdct with short hollow noodles x 2  * 3 way LE kick with UE on short hollow noodles 2 x 5 * hollow noodle stomp with UE on rainbow hand floats-> light touch on wall x 10 each LE * standing balance on hollow noodle with light touch on wall (challenge)-> mini squats -> slow marching * SLS with opp hand holding hollow noodle under water 2 x 10sec each LE * Straddling yellow noodle cycling across pool * standing quad stretch with ankle supported by hollow noodle x 20s   07/23 DKTC with heels on green ball 10 x 5 second holds LTR with heels on green ball 10 x 5 second holds Bridge 2 x 10 Supine clams with RTB  BERG balance screen  Standing hip abduction 2 x 10 SLS with frequent UE support 2x 30 second holds Stranding quad stretch at counter 2x 30 second holds.   10/04/23 Pt seen for aquatic therapy today.  Treatment took place in water 3.5-4.75 ft in depth at the [facility] pool. Temp of water was 91.  Pt entered/exited the pool via stairs using step-to pattern with bilat hand rail.  *unsupported: walking forward/ backward  in 3.6 ft  * side stepping unsupported with arm add/abdct with rainbow ->yellow hand floats  * suit case carry with single yellow hand float at side, walking backwards / forwards  * SLS with hands on top of water (challenge) * semi-tandem stance with hands out of water * tandem gait forward/ backward with hands  resting on water * 3 way LE kick with UE on yellow hand floats 2 x 5 * STS from bench in water with feet on blue step x 10 * alternating toe taps to 5 step in water 2 x 10, hands across chest (improved)  * grapevine Rt/Lt - some irritation to Rt knee afterwards * UE On wall: fig 4 stretch Rt/Lt/Rt 20sec each * Straddling yellow noodle with UE on wall: cycling; -> cycling across pool   TREATMENT  10/01/23 DKTC with heels on green ball 10 x 5 second holds LTR 10 x 5 second holds Bridge 2 x 10 Standing hip abduction 2 x  10 STS 2 x 10  SLS with frequent UE support 2x 30 second holds Step up 4 inch 1 x 10 Lateral step up and over 4 inch 1 x 10   PATIENT EDUCATION:  Education details: aquatic therapy exercise progressions and modifications Person educated: Patient Education method: Explanation Education comprehension: verbalized understanding  HOME EXERCISE PROGRAM: Land Access Code: [email or web address] URL: [email or web address]  Aquatic Access Code: [email or web address] URL: [email or web address] This aquatic home exercise program from [employer] utilizes pictures from land based exercises, but has been adapted prior to lamination and issuance.  * not issued yet  ASSESSMENT:  CLINICAL IMPRESSION: Good tolerance for progressions to strengthening and [facility] today. She was challenged by romberg stance and tandem balance. Pt required modifications for tandem stance and SBA/CGA utilized. She did have difficulty with STS from lowered plinth, but this improved when raised slightly. Will continue to work on balance and strengthening as tolerated.    Initial Impression Patient is a 83 y.o. f who was seen today for physical therapy evaluation and treatment for LBP due to lumbar spondylosis. It is also noted she has a Convex left scoliosis centered at L3 .  She is an active senior and reports reduction in mobility recently partly due to back pain and fear of falling.  Exam demonstrates she has bilat hip weakness, core weakness and as per Lars balance is a high fall risk. Her pain level increases in LB with extended times standing/walking.  It will reduce to 0/10 with rest.  No interruptions in sleep. Pt will benefit from skilled physical therapy to improve all areas of deficits which are effecting her functional mobility and decreasing safety with ADL's.  Plan to initiate in aquatic then transition to land when strength is gained, balance is improved and pain is reduced.  OBJECTIVE IMPAIRMENTS: Abnormal gait,  decreased activity tolerance, decreased balance, decreased endurance, decreased mobility, difficulty walking, decreased strength, postural dysfunction, and pain.   ACTIVITY LIMITATIONS: carrying, lifting, bending, standing, squatting, sleeping, stairs, transfers, locomotion level, and caring for others  PARTICIPATION LIMITATIONS: meal prep, cleaning, laundry, shopping, community activity, and yard work  PERSONAL FACTORS: Past/current experiences, Time since onset of injury/illness/exacerbation, and 1 comorbidity: see PmHx are also affecting patient's functional outcome.   REHAB POTENTIAL: Good  CLINICAL DECISION MAKING: Evolving/moderate complexity  EVALUATION COMPLEXITY: Moderate   GOALS: Goals reviewed with patient? Yes  SHORT TERM GOALS: Target date: 09/26/23  Pt will tolerate full aquatic sessions consistently without increase in pain and with improving function to demonstrate good toleration and effectiveness of intervention.  Baseline: Goal status:Met 09/19/23  2.  Pt will tolerate walking to and from setting and engaging in aquatic therapy session without excessive fatigue or increase in pain to demonstrate improved toleration to activity. Baseline: Increased Rt hip pain with gait Goal status: MET 10/09/2023  3.  Pt will perform tandem  and SLS in 3.6 ft holding for up towards 15s to demonstrate improvements in balance ability Baseline: unable to complete on land Goal status: In progress. 10/11/23    LONG TERM GOALS: Target date: 11/03/23  Pt to improve on ODI by 15% ([facility]) to demonstrate statistically significant Improvement in function. Baseline: 14/50=28% Goal status: PROGRESSING 10/09/2023  Oswestry Score: 12 / 50 or 24 %  2.  Pt will be indep with final HEP's (land and aquatic as appropriate) for continued management of condition Baseline:  Goal status: MET 10/09/2023  3.  Pt will report decrease in pain by at least 50% for improved toleration to activity/quality  of life and to demonstrate improved management of pain. Baseline:  Goal status: MET 1/10 10/09/2023  4.  Pt will report standing toleration up towards 30 minutes to prepare a meal or do dishes without limiting pain Baseline: 15 min Max Goal status: MET 10/09/2023  5.  Pt will improve on Berg balance test to >/= 43/56  (MDC 35/56) to demonstrate a decrease in fall risk. Baseline: 28/56 Goal status: MET 10/09/2023  6.  Pt will improve strength in hips by at least 10 lbs (HD) to demonstrate improved overall physical function Baseline: see chart Goal status: INITIAL  7. Pt will be able to perform SLS for >10 seconds to improve balance.  Baseline: see chart Goal status: NEW PLAN:  PT FREQUENCY: 2x/week  PT DURATION: 10 weeks 16 visits  PLANNED INTERVENTIONS: 97164- PT Re-evaluation, 97110-Therapeutic exercises, 97530- Therapeutic activity, 97112- Neuromuscular re-education, 97535- Self Care, [id number]- Manual therapy, [phone]- Gait training, [phone]- Aquatic Therapy, [phone]- Electrical stimulation (unattended), [phone]- Ionotophoresis 4mg /ml Dexamethasone, Patient/Family education, Balance training, Stair training, Taping, Joint mobilization, DME instructions, Cryotherapy, and Moist heat.  PLAN FOR NEXT SESSION: Aquatic: LE and core strengthening; balance and proprioception retraining, gait toleration  Asberry Rodes, PTA  10/16/23 1:53 PM [facility] [address] [place], [place], [address] Phone: [phone]   Fax:  [phone]

## 2023-10-18 ENCOUNTER — Encounter (HOSPITAL_BASED_OUTPATIENT_CLINIC_OR_DEPARTMENT_OTHER): Payer: Self-pay | Admitting: Physical Therapy

## 2023-10-18 ENCOUNTER — Ambulatory Visit (HOSPITAL_BASED_OUTPATIENT_CLINIC_OR_DEPARTMENT_OTHER): Attending: Sports Medicine | Admitting: Physical Therapy

## 2023-10-18 DIAGNOSIS — M5459 Other low back pain: Secondary | ICD-10-CM | POA: Insufficient documentation

## 2023-10-18 DIAGNOSIS — R2689 Other abnormalities of gait and mobility: Secondary | ICD-10-CM | POA: Insufficient documentation

## 2023-10-18 DIAGNOSIS — M6281 Muscle weakness (generalized): Secondary | ICD-10-CM | POA: Insufficient documentation

## 2023-10-18 DIAGNOSIS — R2681 Unsteadiness on feet: Secondary | ICD-10-CM | POA: Diagnosis not present

## 2023-10-18 NOTE — Therapy (Signed)
 OUTPATIENT PHYSICAL THERAPY THORACOLUMBAR TREATMENT Patient Name: Brittany Oliver MRN: [id number] DOB:[birth year], 83 y.o., female Today's Date: 10/18/2023  END OF SESSION:  PT End of Session - 10/18/23 1101     Visit Number 13    Number of Visits 16    Date for PT Re-Evaluation 11/01/23    Authorization Type MCR    Progress Note Due on Visit 20    PT Start Time 1053    PT Stop Time 1135    PT Time Calculation (min) 42 min    Activity Tolerance Patient tolerated treatment well    Behavior During Therapy [facility] for tasks assessed/performed              Past Medical History:  Diagnosis Date   Arthritis    Cancer (HCC) 02/2013   skin melanoma   Heart murmur    Hyperlipidemia    Hypertension    Neuralgia facialis vera    Stroke (HCC) 1987   has a little balance issue since stroke   Past Surgical History:  Procedure Laterality Date   ABDOMINAL HYSTERECTOMY  1974   pt still has her uterus, took only 1 ovary and 1 tube   APPENDECTOMY     COLONOSCOPY     DILATION AND CURETTAGE OF UTERUS  1965   ESOPHAGOGASTRODUODENOSCOPY (EGD) WITH PROPOFOL  Left 01/23/2019   Procedure: ESOPHAGOGASTRODUODENOSCOPY (EGD) WITH PROPOFOL ;  Surgeon: Burnette Fallow, MD;  Location: [facility];  Service: Endoscopy;  Laterality: Left;   EXCISION MELANOMA WITH SENTINEL LYMPH NODE BIOPSY Left 03/27/2013   Procedure: EXCISION MELANOMA LEFT POSTERIOR ARM WITH LEFT AXILLARY SENTINEL LYMPH NODE BIOPSY;  Surgeon: Dann FORBES Hummer, MD;  Location: [facility] OR;  Service: General;  Laterality: Left;   EYE SURGERY Bilateral 2006   cataract and laser surgery   FINGER SURGERY Right    middle finger   MASS EXCISION Left 09/15/2013   Procedure: EXCISION MASS LEFT UPPER ARM;  Surgeon: Dann FORBES Hummer, MD;  Location: [facility] OR;  Service: General;  Laterality: Left;   TONSILLECTOMY     Patient Active Problem List   Diagnosis Date Noted   Secondary hypercoagulable state (HCC) 03/26/2019   Paroxysmal atrial fibrillation  (HCC)    GI bleed 01/22/2019   Hypertension    Hyperlipidemia    Neuralgia facialis vera    COPD mixed type (HCC) 03/02/2015   Dyspnea 12/23/2014   Ex-cigarette smoker 12/23/2014   Seroma complicating a procedure 09/24/2013   Mass of arm 08/26/2013   Atypical facial pain 07/13/2013   Melanoma of left upper arm (HCC) 03/23/2013   Stroke (HCC) 1987   Anemia due to acute blood loss 1987    PCP: Montie Pizza MD  REFERRING PROVIDER: Juliene Bailey MD  REFERRING DIAG: [phone] (ICD-10-CM) - Other spondylosis, lumbar region   Rationale for Evaluation and Treatment: Rehabilitation  THERAPY DIAG:  Unsteadiness on feet  Other low back pain  Muscle weakness (generalized)  Other abnormalities of gait and mobility  ONSET DATE: exacerbation 1 yr  SUBJECTIVE:  SUBJECTIVE STATEMENT: She gave me a good work out last time. Pt reports some irritation in Rt knee yesterday, pain up to 5/10.  Iced knee and rested and pain is back down.    POOL ACCESS: Niece has pool in yard, using ~2x/wk   Initial Subjective Exercises about 2 x week walking out door.  My strength and balance are my 2 things I want to improve.  When I was 45 I had a brain stem stroke completely recovered except minor balance issue mostly when I am tired. Rotator nystagmus residual but MD says my brain has overrode it.  PERTINENT HISTORY:  Convex left scoliosis centered at L3  A-fib COPD  PAIN:  Are you having pain? Yes: NPRS scale: current 1/10;  Pain location:  Rt medial knee Pain description: ache Aggravating factors: standing 15 minutes Relieving factors: sitting  PRECAUTIONS: None  RED FLAGS: None   WEIGHT BEARING RESTRICTIONS: No  FALLS:  Has patient fallen in last 6 months? No  LIVING ENVIRONMENT: Lives with: lives  with their spouse Lives in: House/apartment Stairs: Yes: Internal: 16 steps; on right going up main floor with bed and bath 1st floor Has following equipment at home: None  OCCUPATION: retired  PLOF: Independent  PATIENT GOALS: improve my strength and my balance  NEXT MD VISIT: as needed  OBJECTIVE:  Note: Objective measures were completed at Evaluation unless otherwise noted.  DIAGNOSTIC FINDINGS:  MRI Lumbar 11/23 IMPRESSION: 1. Compared with previous MRI from 2019, no acute findings or clear explanation for the patient's symptoms. 2. Chronic multilevel spondylosis associated with a convex left scoliosis, similar to previous study from 2019. 3. Chronic foraminal narrowing on the right at L3-4, bilaterally at L4-5 and on the left at L5-S1. No high-grade spinal stenosis or acute nerve root encroachment.  PATIENT SURVEYS:  Modified Oswestry 14/50=28%   COGNITION: Overall cognitive status: Within functional limits for tasks assessed     SENSATION: WFL  MUSCLE LENGTH: Hamstrings: wfl tested in sitting   POSTURE: rounded shoulders, forward head, increased thoracic kyphosis, and left pelvic obliquity Convex left scoliosis centered at L3   PALPATION: Mild TTP upper lumbar and lower thoracic paraspinals  LUMBAR ROM:   AROM eval  Flexion FT to mid calf P!  Extension Limited 25%  Right lateral flexion full  Left lateral flexion full  Right rotation   Left rotation    (Blank rows = not tested)  LOWER EXTREMITY ROM:     wfl  LOWER EXTREMITY MMT:    MMT Right eval Left eval Right 7/30 Left  7/30  Hip flexion 31.1 32.0 39.8 40.0  Hip extension      Hip abduction 28.5 21.8 28.9 (s/l) 37.1 (s/l)  Hip adduction      Hip internal rotation      Hip external rotation      Knee flexion      Knee extension 29.7 35.1 37.0 32.0 (had cramping in leg)  Ankle dorsiflexion      Ankle plantarflexion      Ankle inversion      Ankle eversion       (Blank rows = not  tested)  LUMBAR SPECIAL TESTS:  Slump test: Negative  FUNCTIONAL TESTS:  5 times sit to stand: from bench 24.45 using  Timed up and go (TUG): 13.24  BERG Balance Test     Date: eval   eval 10/09/2023  Sit to Stand 2 4  Standing unsupported 4 4  Sitting with back unsupported but  feet supported 4 4  Stand to sit  2 4  Transfers  3 4  Standing unsupported with eyes closed 3 4  Standing unsupported feet together 1 4  From standing position, reach forward with outstretched arm 3 3  From standing position, pick up object from floor 3 3  From standing position, turn and look behind over each shoulder 3 3  Turn 360 1 3  Standing unsupported, alternately place foot on step 1 4  Standing unsupported, one foot in front 1 1  Standing on one leg 0 1  Total:  28 46    GAIT: Distance walked: 500 ft Assistive device utilized: None Level of assistance: Complete Independence Comments: shorted step length, decreased cadence  [facility] Adult PT Treatment:                           10/18/23 Pt seen for aquatic therapy today.  Treatment took place in water 3.5-4.75 ft in depth at the [facility] pool. Temp of water was 91.  Pt entered/exited the pool via stairs using step-to pattern with bilat hand rail.  *unsupported: walking forward/ backward  in 3.6 ft, 3 laps * suit case carry with bil rainbow hand float at side, walking backwards / forwards  * side stepping with arm add/abdct with rainbow hand floats x 3 laps   *UE on rainbow hand floats:  leg swings into hip flex/extension and hip abdct/ add 2 x 10 each, with varied speed * hollow noodle stomp with light UE support on wall x15 each LE * standing balance on hollow noodle with light touch on wall (good challenge, improved)-> mini squats -> slow marching * SLS with opp hand holding hollow noodle under water 2 x 15-20 sec each LE * Straddling yellow noodle cycling across pool * single leg superman with UE on yellow noodle x 8 each LE *  standing quad stretch with ankle supported by hollow noodle x 20s   [facility] Adult PT Treatment:                                   DATE: 07/30 MMT testing DKTC with heels on green ball 20 x 5 second holds LTR with heels on green ball 10 x 5 second holds Bridge 2 x 10 Supine clams with GTB 2x15 Sit to stands from elevated plinth 2x10 Standing HR/TR 2x10 Standing hip abduction 1# 2 x 10 SLS with frequent UE support 2x 30 second holds Romberg on airex with CGA 2x30 seconds Modified tandem stance x30sec ea on floor SBA/CGA Recumbent bike L3 x77min   10/11/23 Pt seen for aquatic therapy today.  Treatment took place in water 3.5-4.75 ft in depth at the [facility] pool. Temp of water was 91.  Pt entered/exited the pool via stairs using step-to pattern with bilat hand rail.  *unsupported: walking forward/ backward  in 3.6 ft, multiple laps  * suit case carry with bil rainbow hand float at side, walking backwards / forwards  * side stepping with arm add/abdct with short hollow noodles x 2  * 3 way LE kick with UE on short hollow noodles 2 x 5 * hollow noodle stomp with UE on rainbow hand floats-> light touch on wall x 10 each LE * standing balance on hollow noodle with light touch on wall (challenge)-> mini squats -> slow marching * SLS with opp hand  holding hollow noodle under water 2 x 10sec each LE * Straddling yellow noodle cycling across pool * standing quad stretch with ankle supported by hollow noodle x 20s   07/23 DKTC with heels on green ball 10 x 5 second holds LTR with heels on green ball 10 x 5 second holds Bridge 2 x 10 Supine clams with RTB  BERG balance screen  Standing hip abduction 2 x 10 SLS with frequent UE support 2x 30 second holds Stranding quad stretch at counter 2x 30 second holds.   10/04/23 Pt seen for aquatic therapy today.  Treatment took place in water 3.5-4.75 ft in depth at the [facility] pool. Temp of water was 91.  Pt entered/exited the  pool via stairs using step-to pattern with bilat hand rail.  *unsupported: walking forward/ backward  in 3.6 ft  * side stepping unsupported with arm add/abdct with rainbow ->yellow hand floats  * suit case carry with single yellow hand float at side, walking backwards / forwards  * SLS with hands on top of water (challenge) * semi-tandem stance with hands out of water * tandem gait forward/ backward with hands resting on water * 3 way LE kick with UE on yellow hand floats 2 x 5 * STS from bench in water with feet on blue step x 10 * alternating toe taps to 5 step in water 2 x 10, hands across chest (improved)  * grapevine Rt/Lt - some irritation to Rt knee afterwards * UE On wall: fig 4 stretch Rt/Lt/Rt 20sec each * Straddling yellow noodle with UE on wall: cycling; -> cycling across pool   TREATMENT  10/01/23 DKTC with heels on green ball 10 x 5 second holds LTR 10 x 5 second holds Bridge 2 x 10 Standing hip abduction 2 x 10 STS 2 x 10  SLS with frequent UE support 2x 30 second holds Step up 4 inch 1 x 10 Lateral step up and over 4 inch 1 x 10   PATIENT EDUCATION:  Education details: aquatic therapy exercise progressions and modifications Person educated: Patient Education method: Explanation Education comprehension: verbalized understanding  HOME EXERCISE PROGRAM: Land Access Code: [email or web address] URL: [email or web address]  Aquatic Access Code: [email or web address] URL: [email or web address] This aquatic home exercise program from [employer] utilizes pictures from land based exercises, but has been adapted prior to lamination and issuance.  * issued 10/18/23  ASSESSMENT:  CLINICAL IMPRESSION: Good tolerance for aquatic exercises today, without production of pain.  She continues to be challenged with SLS in water. Issued laminated aquatic HEP today.  Will modify HEP as needed in upcoming visits. Will continue to work on balance and strengthening as tolerated.  Progressing well towards remaining goals.    Initial Impression Patient is a 83 y.o. f who was seen today for physical therapy evaluation and treatment for LBP due to lumbar spondylosis. It is also noted she has a Convex left scoliosis centered at L3 .  She is an active senior and reports reduction in mobility recently partly due to back pain and fear of falling.  Exam demonstrates she has bilat hip weakness, core weakness and as per Lars balance is a high fall risk. Her pain level increases in LB with extended times standing/walking.  It will reduce to 0/10 with rest.  No interruptions in sleep. Pt will benefit from skilled physical therapy to improve all areas of deficits which are effecting her functional mobility and decreasing safety with ADL's.  Plan to initiate in aquatic then transition to  land when strength is gained, balance is improved and pain is reduced.  OBJECTIVE IMPAIRMENTS: Abnormal gait, decreased activity tolerance, decreased balance, decreased endurance, decreased mobility, difficulty walking, decreased strength, postural dysfunction, and pain.   ACTIVITY LIMITATIONS: carrying, lifting, bending, standing, squatting, sleeping, stairs, transfers, locomotion level, and caring for others  PARTICIPATION LIMITATIONS: meal prep, cleaning, laundry, shopping, community activity, and yard work  PERSONAL FACTORS: Past/current experiences, Time since onset of injury/illness/exacerbation, and 1 comorbidity: see PmHx are also affecting patient's functional outcome.   REHAB POTENTIAL: Good  CLINICAL DECISION MAKING: Evolving/moderate complexity  EVALUATION COMPLEXITY: Moderate   GOALS: Goals reviewed with patient? Yes  SHORT TERM GOALS: Target date: 09/26/23  Pt will tolerate full aquatic sessions consistently without increase in pain and with improving function to demonstrate good toleration and effectiveness of intervention.  Baseline: Goal status:Met 09/19/23  2.  Pt will tolerate  walking to and from setting and engaging in aquatic therapy session without excessive fatigue or increase in pain to demonstrate improved toleration to activity. Baseline: Increased Rt hip pain with gait Goal status: MET 10/09/2023  3.  Pt will perform tandem and SLS in 3.6 ft holding for up towards 15s to demonstrate improvements in balance ability Baseline: unable to complete on land Goal status: In progress. 10/11/23    LONG TERM GOALS: Target date: 11/03/23  Pt to improve on ODI by 15% (MCID) to demonstrate statistically significant Improvement in function. Baseline: 14/50=28% Goal status: PROGRESSING 10/09/2023  Oswestry Score: 12 / 50 or 24 %  2.  Pt will be indep with final HEP's (land and aquatic as appropriate) for continued management of condition Baseline:  Goal status: MET 10/09/2023  3.  Pt will report decrease in pain by at least 50% for improved toleration to activity/quality of life and to demonstrate improved management of pain. Baseline:  Goal status: MET 1/10 10/09/2023  4.  Pt will report standing toleration up towards 30 minutes to prepare a meal or do dishes without limiting pain Baseline: 15 min Max Goal status: MET 10/09/2023  5.  Pt will improve on Berg balance test to >/= 43/56  (MDC 35/56) to demonstrate a decrease in fall risk. Baseline: 28/56 Goal status: MET 10/09/2023  6.  Pt will improve strength in hips by at least 10 lbs (HD) to demonstrate improved overall physical function Baseline: see chart Goal status: INITIAL  7. Pt will be able to perform SLS for >10 seconds to improve balance.  Baseline: see chart Goal status: NEW PLAN:  PT FREQUENCY: 2x/week  PT DURATION: 10 weeks 16 visits  PLANNED INTERVENTIONS: 97164- PT Re-evaluation, 97110-Therapeutic exercises, 97530- Therapeutic activity, 97112- Neuromuscular re-education, 97535- Self Care, [id number]- Manual therapy, [phone]- Gait training, [phone]- Aquatic Therapy, [phone]- Electrical stimulation  (unattended), [phone]- Ionotophoresis 4mg /ml Dexamethasone, Patient/Family education, Balance training, Stair training, Taping, Joint mobilization, DME instructions, Cryotherapy, and Moist heat.  PLAN FOR NEXT SESSION: Aquatic: LE and core strengthening; balance and proprioception retraining, gait toleration  Delon Aquas, PTA 10/18/23 4:20 PM [facility] [address] [place], [place], [address] Phone: [phone]   Fax:  [phone]

## 2023-10-21 DIAGNOSIS — M1711 Unilateral primary osteoarthritis, right knee: Secondary | ICD-10-CM | POA: Diagnosis not present

## 2023-10-22 ENCOUNTER — Encounter (HOSPITAL_BASED_OUTPATIENT_CLINIC_OR_DEPARTMENT_OTHER): Payer: Self-pay | Admitting: Physical Therapy

## 2023-10-23 ENCOUNTER — Encounter (HOSPITAL_BASED_OUTPATIENT_CLINIC_OR_DEPARTMENT_OTHER): Payer: Self-pay

## 2023-10-23 ENCOUNTER — Encounter (HOSPITAL_BASED_OUTPATIENT_CLINIC_OR_DEPARTMENT_OTHER): Admitting: Physical Therapy

## 2023-10-25 ENCOUNTER — Ambulatory Visit (HOSPITAL_BASED_OUTPATIENT_CLINIC_OR_DEPARTMENT_OTHER): Admitting: Physical Therapy

## 2023-10-30 ENCOUNTER — Encounter (HOSPITAL_BASED_OUTPATIENT_CLINIC_OR_DEPARTMENT_OTHER): Payer: Self-pay | Admitting: Physical Therapy

## 2023-10-30 ENCOUNTER — Ambulatory Visit (HOSPITAL_BASED_OUTPATIENT_CLINIC_OR_DEPARTMENT_OTHER): Admitting: Physical Therapy

## 2023-10-30 DIAGNOSIS — R2689 Other abnormalities of gait and mobility: Secondary | ICD-10-CM | POA: Diagnosis not present

## 2023-10-30 DIAGNOSIS — M5459 Other low back pain: Secondary | ICD-10-CM

## 2023-10-30 DIAGNOSIS — M6281 Muscle weakness (generalized): Secondary | ICD-10-CM | POA: Diagnosis not present

## 2023-10-30 DIAGNOSIS — R2681 Unsteadiness on feet: Secondary | ICD-10-CM | POA: Diagnosis not present

## 2023-10-30 NOTE — Therapy (Signed)
 OUTPATIENT PHYSICAL THERAPY THORACOLUMBAR TREATMENT  Progress Note Reporting Period 10/11/23 to 10/29/13  See note below for Objective Data and Assessment of Progress/Goals.     Patient Name: Brittany Oliver MRN: [id number] DOB:1940-04-20, 83 y.o., female Today's Date: 10/31/2023  END OF SESSION:  PT End of Session - 10/30/23 1120     Visit Number 14    Number of Visits 17    Date for PT Re-Evaluation 11/20/23    Authorization Type MCR    PT Start Time 1109    PT Stop Time 1200    PT Time Calculation (min) 51 min    Activity Tolerance Patient tolerated treatment well    Behavior During Therapy [facility] for tasks assessed/performed               Past Medical History:  Diagnosis Date   Arthritis    Cancer (HCC) 02/2013   skin melanoma   Heart murmur    Hyperlipidemia    Hypertension    Neuralgia facialis vera    Stroke (HCC) 1987   has a little balance issue since stroke   Past Surgical History:  Procedure Laterality Date   ABDOMINAL HYSTERECTOMY  1974   pt still has her uterus, took only 1 ovary and 1 tube   APPENDECTOMY     COLONOSCOPY     DILATION AND CURETTAGE OF UTERUS  1965   ESOPHAGOGASTRODUODENOSCOPY (EGD) WITH PROPOFOL  Left 01/23/2019   Procedure: ESOPHAGOGASTRODUODENOSCOPY (EGD) WITH PROPOFOL ;  Surgeon: Burnette Fallow, MD;  Location: [facility];  Service: Endoscopy;  Laterality: Left;   EXCISION MELANOMA WITH SENTINEL LYMPH NODE BIOPSY Left 03/27/2013   Procedure: EXCISION MELANOMA LEFT POSTERIOR ARM WITH LEFT AXILLARY SENTINEL LYMPH NODE BIOPSY;  Surgeon: Dann FORBES Hummer, MD;  Location: [facility] OR;  Service: General;  Laterality: Left;   EYE SURGERY Bilateral 2006   cataract and laser surgery   FINGER SURGERY Right    middle finger   MASS EXCISION Left 09/15/2013   Procedure: EXCISION MASS LEFT UPPER ARM;  Surgeon: Dann FORBES Hummer, MD;  Location: [facility] OR;  Service: General;  Laterality: Left;   TONSILLECTOMY     Patient Active Problem List    Diagnosis Date Noted   Secondary hypercoagulable state (HCC) 03/26/2019   Paroxysmal atrial fibrillation (HCC)    GI bleed 01/22/2019   Hypertension    Hyperlipidemia    Neuralgia facialis vera    COPD mixed type (HCC) 03/02/2015   Dyspnea 12/23/2014   Ex-cigarette smoker 12/23/2014   Seroma complicating a procedure 09/24/2013   Mass of arm 08/26/2013   Atypical facial pain 07/13/2013   Melanoma of left upper arm (HCC) 03/23/2013   Stroke (HCC) 1987   Anemia due to acute blood loss 1987    PCP: Montie Pizza MD  REFERRING PROVIDER: Juliene Bailey MD  REFERRING DIAG: [id number] (ICD-10-CM) - Other spondylosis, lumbar region   Rationale for Evaluation and Treatment: Rehabilitation  THERAPY DIAG:  Other low back pain  Muscle weakness (generalized)  Other abnormalities of gait and mobility  Unsteadiness on feet  ONSET DATE: exacerbation 1 yr  SUBJECTIVE:  SUBJECTIVE STATEMENT: Pt states she had increased R knee pain after prior aquatic treatment.  She had pain for a couple of days and felt better on Sunday.  The pain returned after church.  Pt went to [facility] and received a cortisone injection and prednisone.  Pt states her knee is feeling better now, but not a 100%.  PA note indicated suspected flare of R knee OA.  The PA informed her to rest, but it was ok to return to PT today.     Pt states she felt fine after prior land based treatment.   Her back is better.  I don't have the back pain that I had before therapy.  The pool is wonderful.  She still has back pain if she does too much.  Pt reports improved strength including core strength.  Pt is able to stand up from chairs without using UE's. Pt reports she is limited with ambulation distance due to her back pain and COPD.  Pt is  limited with lifting.     Pt has been performing her aquatic exercises in her niece's pool approx 2x/wk though unable to recently due to the rain and her knee pain.  Pt is performing her HEP though not as often as she should.  She also performs some other exercises.     POOL ACCESS: Niece has pool in yard, using ~2x/wk    PERTINENT HISTORY:  Convex left scoliosis centered at L3  A-fib COPD  PAIN:  Are you having pain? Yes: NPRS scale:  0/10 current, 2/10 worst / 2/10  Pain location:  lumbar / R knee Pain description: ache Aggravating factors: standing 15 minutes Relieving factors: sitting  PRECAUTIONS: None  RED FLAGS: None   WEIGHT BEARING RESTRICTIONS: No  FALLS:  Has patient fallen in last 6 months? No  LIVING ENVIRONMENT: Lives with: lives with their spouse Lives in: House/apartment Stairs: Yes: Internal: 16 steps; on right going up main floor with bed and bath 1st floor Has following equipment at home: None  OCCUPATION: retired  PLOF: Independent  PATIENT GOALS: improve my strength and my balance  NEXT MD VISIT: as needed  OBJECTIVE:  Note: Objective measures were completed at Evaluation unless otherwise noted.  DIAGNOSTIC FINDINGS:  MRI Lumbar 11/23 IMPRESSION: 1. Compared with previous MRI from 2019, no acute findings or clear explanation for the patient's symptoms. 2. Chronic multilevel spondylosis associated with a convex left scoliosis, similar to previous study from 2019. 3. Chronic foraminal narrowing on the right at L3-4, bilaterally at L4-5 and on the left at L5-S1. No high-grade spinal stenosis or acute nerve root encroachment.  PATIENT SURVEYS:  Modified Oswestry Initial/Current:  14/50=28% /  14/50= 28%   COGNITION: Overall cognitive status: Within functional limits for tasks assessed     SENSATION: WFL  MUSCLE LENGTH: Hamstrings: wfl tested in sitting   POSTURE: rounded shoulders, forward head, increased thoracic kyphosis, and  left pelvic obliquity Convex left scoliosis centered at L3   PALPATION: Mild TTP upper lumbar and lower thoracic paraspinals  LUMBAR ROM:   AROM eval  Flexion FT to mid calf P!  Extension Limited 25%  Right lateral flexion full  Left lateral flexion full  Right rotation   Left rotation    (Blank rows = not tested)  LOWER EXTREMITY ROM:     wfl  LOWER EXTREMITY MMT:    MMT Right eval Left eval Right 7/30 Left  7/30 Right 8/13 Left 8/13  Hip flexion 31.1 32.0  39.8 40.0 32.6 ; 5/5 31.7 ; 5/5  Hip extension        Hip abduction 28.5 21.8 28.9 (s/l) 37.1 (s/l) 26.7 25.1  Hip adduction        Hip internal rotation        Hip external rotation        Knee flexion        Knee extension 29.7 35.1 37.0 32.0 (had cramping in leg)    Ankle dorsiflexion        Ankle plantarflexion        Ankle inversion        Ankle eversion         (Blank rows = not tested)  LUMBAR SPECIAL TESTS:  Slump test: Negative  FUNCTIONAL TESTS:  5 times sit to stand: from bench 24.45 using  Timed up and go (TUG): 13.24  BERG Balance Test     Date: eval   eval 10/09/2023  Sit to Stand 2 4  Standing unsupported 4 4  Sitting with back unsupported but feet supported 4 4  Stand to sit  2 4  Transfers  3 4  Standing unsupported with eyes closed 3 4  Standing unsupported feet together 1 4  From standing position, reach forward with outstretched arm 3 3  From standing position, pick up object from floor 3 3  From standing position, turn and look behind over each shoulder 3 3  Turn 360 1 3  Standing unsupported, alternately place foot on step 1 4  Standing unsupported, one foot in front 1 1  Standing on one leg 0 1  Total:  28 46    GAIT: Assistive device utilized: None Level of assistance: Complete Independence Comments: shortened step length, decreased cadence, decreased bilat pelvic rotation bilat  [facility] Adult PT  10/30/23:  Reviewed pt presentation, current function, response to  prior Rx, HEP compliance, and pain levels. Assessed gait and LE strength. Reviewed HEP.  Pt completed modified oswestry disability index.  SLS:  R:  not tested, L:  4 sec  Supine bridge 2x10 Standing HR/TR 2x10 LTR with LE's on green ball x 10 bilat second holds Romberg on airex with CGA 2x30 seconds Modified tandem stance 2x30sec ea on floor SBA-min assist with occasional UE support on counter     Treatment:                           10/18/23 Pt seen for aquatic therapy today.  Treatment took place in water 3.5-4.75 ft in depth at the [facility] pool. Temp of water was 91.  Pt entered/exited the pool via stairs using step-to pattern with bilat hand rail.  *unsupported: walking forward/ backward  in 3.6 ft, 3 laps * suit case carry with bil rainbow hand float at side, walking backwards / forwards  * side stepping with arm add/abdct with rainbow hand floats x 3 laps   *UE on rainbow hand floats:  leg swings into hip flex/extension and hip abdct/ add 2 x 10 each, with varied speed * hollow noodle stomp with light UE support on wall x15 each LE * standing balance on hollow noodle with light touch on wall (good challenge, improved)-> mini squats -> slow marching * SLS with opp hand holding hollow noodle under water 2 x 15-20 sec each LE * Straddling yellow noodle cycling across pool * single leg superman with UE on yellow noodle x 8 each LE * standing  quad stretch with ankle supported by hollow noodle x 20s   [facility] Adult PT Treatment:                                   DATE: 07/30 MMT testing DKTC with heels on green ball 20 x 5 second holds LTR with heels on green ball 10 x 5 second holds Bridge 2 x 10 Supine clams with GTB 2x15 Sit to stands from elevated plinth 2x10 Standing HR/TR 2x10 Standing hip abduction 1# 2 x 10 SLS with frequent UE support 2x 30 second holds Romberg on airex with CGA 2x30 seconds Modified tandem stance x30sec ea on floor SBA/CGA Recumbent bike L3  x79min   10/11/23 Pt seen for aquatic therapy today.  Treatment took place in water 3.5-4.75 ft in depth at the [facility] pool. Temp of water was 91.  Pt entered/exited the pool via stairs using step-to pattern with bilat hand rail.  *unsupported: walking forward/ backward  in 3.6 ft, multiple laps  * suit case carry with bil rainbow hand float at side, walking backwards / forwards  * side stepping with arm add/abdct with short hollow noodles x 2  * 3 way LE kick with UE on short hollow noodles 2 x 5 * hollow noodle stomp with UE on rainbow hand floats-> light touch on wall x 10 each LE * standing balance on hollow noodle with light touch on wall (challenge)-> mini squats -> slow marching * SLS with opp hand holding hollow noodle under water 2 x 10sec each LE * Straddling yellow noodle cycling across pool * standing quad stretch with ankle supported by hollow noodle x 20s     PATIENT EDUCATION:  Education details: objective findings, POC, goal progress, rationale of interventions, and exercise form.  Person educated: Patient Education method: Explanation, demonstration, verbal cues Education comprehension: verbalized understanding, returned demonstration, verbal cues required  HOME EXERCISE PROGRAM: Land Access Code: [email or web address] URL: [email or web address]  Aquatic Access Code: [email or web address] URL: [email or web address] This aquatic home exercise program from [employer] utilizes pictures from land based exercises, but has been adapted prior to lamination and issuance.  * issued 10/18/23  ASSESSMENT:  CLINICAL IMPRESSION: Pt has made good progress in aquatic therapy.  Pt states she doesn't have the back pain that she was having before therapy.  Pt is able to stand up from chairs without using UE's.  She reports having back pain if she does too much and does have limitations with ambulation distance due to back pain and COPD.  Pt has recently increased knee  pain and received a cortisone injection and prednisone.  Pt reports her knee is feeling better currently.  PT assessed LE strength.  Bilat Hip flexion strength and R hip abd strength were worse though L hip abd strength had improved.  Pt had no change in modified Oswestry score compared to initial eval though worse compared to prior PN.  Pt has an aquatic therapy handout and a land based HEP.  She has been performing aquatic exercises in her niece's pool though not recently due to the rain and her knee pain.  PT reviewed her land based HEP.  She responded well to Rx and had no c/o's after Rx.  Pt may benefit from 2-3 more visits to ensure independence with both aquatic and land based HEP and progression if needed.  OBJECTIVE IMPAIRMENTS: Abnormal gait, decreased activity tolerance, decreased balance, decreased endurance, decreased mobility,  difficulty walking, decreased strength, postural dysfunction, and pain.   ACTIVITY LIMITATIONS: carrying, lifting, bending, standing, squatting, sleeping, stairs, transfers, locomotion level, and caring for others  PARTICIPATION LIMITATIONS: meal prep, cleaning, laundry, shopping, community activity, and yard work  PERSONAL FACTORS: Past/current experiences, Time since onset of injury/illness/exacerbation, and 1 comorbidity: see PmHx are also affecting patient's functional outcome.   REHAB POTENTIAL: Good  CLINICAL DECISION MAKING: Evolving/moderate complexity  EVALUATION COMPLEXITY: Moderate   GOALS: Goals reviewed with patient? Yes  SHORT TERM GOALS: Target date: 09/26/23  Pt will tolerate full aquatic sessions consistently without increase in pain and with improving function to demonstrate good toleration and effectiveness of intervention.  Baseline: Goal status:Met 09/19/23  2.  Pt will tolerate walking to and from setting and engaging in aquatic therapy session without excessive fatigue or increase in pain to demonstrate improved toleration to  activity. Baseline: Increased Rt hip pain with gait Goal status: MET 10/09/2023  3.  Pt will perform tandem and SLS in 3.6 ft holding for up towards 15s to demonstrate improvements in balance ability Baseline: unable to complete on land Goal status: In progress. 10/11/23    LONG TERM GOALS: Target date: 11/20/23  Pt to improve on ODI by 15% (MCID) to demonstrate statistically significant Improvement in function. Baseline: 14/50=28% Goal status: NOT MET  2.  Pt will be indep with final HEP's (land and aquatic as appropriate) for continued management of condition Baseline:  Goal status: MET 10/09/2023  3.  Pt will report decrease in pain by at least 50% for improved toleration to activity/quality of life and to demonstrate improved management of pain. Baseline:  Goal status: MET 1/10 10/09/2023  4.  Pt will report standing toleration up towards 30 minutes to prepare a meal or do dishes without limiting pain Baseline: 15 min Max Goal status: MET 10/09/2023  5.  Pt will improve on Berg balance test to >/= 43/56  (MDC 35/56) to demonstrate a decrease in fall risk. Baseline: 28/56 Goal status: MET 10/09/2023  6.  Pt will improve strength in hips by at least 10 lbs (HD) to demonstrate improved overall physical function Baseline: see chart Goal status: NOT MET  7. Pt will be able to perform SLS for >10 seconds to improve balance.  Baseline: see chart Goal status: ONGOING PLAN:  PT FREQUENCY: 1-2x/wk for 2-3 more visits  PT DURATION: probably 2 more weeks of PT though put cert out for 3 weeks due to schedule.  PLANNED INTERVENTIONS: 97164- PT Re-evaluation, 97110-Therapeutic exercises, 97530- Therapeutic activity, [id number]- Neuromuscular re-education, [phone]- Self Care, 02859- Manual therapy, [phone]- Gait training, [phone]- Aquatic Therapy, [phone]- Electrical stimulation (unattended), [phone]- Ionotophoresis 4mg /ml Dexamethasone, Patient/Family education, Balance training, Stair training,  Taping, Joint mobilization, DME instructions, Cryotherapy, and Moist heat.  PLAN FOR NEXT SESSION: Aquatic and land based therapy.  Ensure independence with HEP.  Leigh Minerva III PT, DPT 10/31/23 11:12 PM  [facility] [address] [place], [place], [address] Phone: [phone]   Fax:  [phone]

## 2023-11-01 ENCOUNTER — Ambulatory Visit (HOSPITAL_BASED_OUTPATIENT_CLINIC_OR_DEPARTMENT_OTHER): Admitting: Physical Therapy

## 2023-11-01 ENCOUNTER — Encounter (HOSPITAL_BASED_OUTPATIENT_CLINIC_OR_DEPARTMENT_OTHER): Payer: Self-pay | Admitting: Physical Therapy

## 2023-11-01 DIAGNOSIS — M5459 Other low back pain: Secondary | ICD-10-CM | POA: Diagnosis not present

## 2023-11-01 DIAGNOSIS — M6281 Muscle weakness (generalized): Secondary | ICD-10-CM | POA: Diagnosis not present

## 2023-11-01 DIAGNOSIS — R2689 Other abnormalities of gait and mobility: Secondary | ICD-10-CM

## 2023-11-01 DIAGNOSIS — R2681 Unsteadiness on feet: Secondary | ICD-10-CM | POA: Diagnosis not present

## 2023-11-01 NOTE — Therapy (Signed)
 OUTPATIENT PHYSICAL THERAPY THORACOLUMBAR TREATMENT   Patient Name: Brittany Oliver MRN: [id number] DOB:February 23, 1941, 83 y.o., female Today's Date: 11/01/2023  END OF SESSION:  PT End of Session - 11/01/23 1100     Visit Number 15    Number of Visits 17    Date for PT Re-Evaluation 11/20/23    Authorization Type MCR    Progress Note Due on Visit 20    PT Start Time 1058    PT Stop Time 1136    PT Time Calculation (min) 38 min    Behavior During Therapy [facility] for tasks assessed/performed               Past Medical History:  Diagnosis Date   Arthritis    Cancer (HCC) 02/2013   skin melanoma   Heart murmur    Hyperlipidemia    Hypertension    Neuralgia facialis vera    Stroke (HCC) 1987   has a little balance issue since stroke   Past Surgical History:  Procedure Laterality Date   ABDOMINAL HYSTERECTOMY  1974   pt still has her uterus, took only 1 ovary and 1 tube   APPENDECTOMY     COLONOSCOPY     DILATION AND CURETTAGE OF UTERUS  1965   ESOPHAGOGASTRODUODENOSCOPY (EGD) WITH PROPOFOL  Left 01/23/2019   Procedure: ESOPHAGOGASTRODUODENOSCOPY (EGD) WITH PROPOFOL ;  Surgeon: Burnette Fallow, MD;  Location: [facility];  Service: Endoscopy;  Laterality: Left;   EXCISION MELANOMA WITH SENTINEL LYMPH NODE BIOPSY Left 03/27/2013   Procedure: EXCISION MELANOMA LEFT POSTERIOR ARM WITH LEFT AXILLARY SENTINEL LYMPH NODE BIOPSY;  Surgeon: Dann FORBES Hummer, MD;  Location: [facility] OR;  Service: General;  Laterality: Left;   EYE SURGERY Bilateral 2006   cataract and laser surgery   FINGER SURGERY Right    middle finger   MASS EXCISION Left 09/15/2013   Procedure: EXCISION MASS LEFT UPPER ARM;  Surgeon: Dann FORBES Hummer, MD;  Location: [facility] OR;  Service: General;  Laterality: Left;   TONSILLECTOMY     Patient Active Problem List   Diagnosis Date Noted   Secondary hypercoagulable state (HCC) 03/26/2019   Paroxysmal atrial fibrillation (HCC)    GI bleed 01/22/2019   Hypertension     Hyperlipidemia    Neuralgia facialis vera    COPD mixed type (HCC) 03/02/2015   Dyspnea 12/23/2014   Ex-cigarette smoker 12/23/2014   Seroma complicating a procedure 09/24/2013   Mass of arm 08/26/2013   Atypical facial pain 07/13/2013   Melanoma of left upper arm (HCC) 03/23/2013   Stroke (HCC) 1987   Anemia due to acute blood loss 1987    PCP: Montie Pizza MD  REFERRING PROVIDER: Juliene Bailey MD  REFERRING DIAG: [phone] (ICD-10-CM) - Other spondylosis, lumbar region   Rationale for Evaluation and Treatment: Rehabilitation  THERAPY DIAG:  Other low back pain  Muscle weakness (generalized)  Other abnormalities of gait and mobility  Unsteadiness on feet  ONSET DATE: exacerbation 1 yr  SUBJECTIVE:  SUBJECTIVE STATEMENT: Pt reports some improvement in Rt knee pain since injection. Pt states she thinks the pool noodle stomp is what irritated her knee last session.  She reports she hasn't been to pool in 2 wks due to weather.   I think my back has been helped tremendously with the land and water exercises.      POOL ACCESS: Niece has pool in yard, using ~2x/wk    PERTINENT HISTORY:  Convex left scoliosis centered at L3  A-fib COPD  PAIN:  Are you having pain? Yes: NPRS scale:  1/10 current Pain location:   R knee Pain description: ache Aggravating factors: standing 15 minutes Relieving factors: sitting  PRECAUTIONS: None  RED FLAGS: None   WEIGHT BEARING RESTRICTIONS: No  FALLS:  Has patient fallen in last 6 months? No  LIVING ENVIRONMENT: Lives with: lives with their spouse Lives in: House/apartment Stairs: Yes: Internal: 16 steps; on right going up main floor with bed and bath 1st floor Has following equipment at home: None  OCCUPATION: retired  PLOF:  Independent  PATIENT GOALS: improve my strength and my balance  NEXT MD VISIT: as needed  OBJECTIVE:  Note: Objective measures were completed at Evaluation unless otherwise noted.  DIAGNOSTIC FINDINGS:  MRI Lumbar 11/23 IMPRESSION: 1. Compared with previous MRI from 2019, no acute findings or clear explanation for the patient's symptoms. 2. Chronic multilevel spondylosis associated with a convex left scoliosis, similar to previous study from 2019. 3. Chronic foraminal narrowing on the right at L3-4, bilaterally at L4-5 and on the left at L5-S1. No high-grade spinal stenosis or acute nerve root encroachment.  PATIENT SURVEYS:  Modified Oswestry Initial/Current:  14/50=28% /  14/50= 28%   COGNITION: Overall cognitive status: Within functional limits for tasks assessed     SENSATION: WFL  MUSCLE LENGTH: Hamstrings: wfl tested in sitting   POSTURE: rounded shoulders, forward head, increased thoracic kyphosis, and left pelvic obliquity Convex left scoliosis centered at L3   PALPATION: Mild TTP upper lumbar and lower thoracic paraspinals  LUMBAR ROM:   AROM eval  Flexion FT to mid calf P!  Extension Limited 25%  Right lateral flexion full  Left lateral flexion full  Right rotation   Left rotation    (Blank rows = not tested)  LOWER EXTREMITY ROM:     wfl  LOWER EXTREMITY MMT:    MMT Right eval Left eval Right 7/30 Left  7/30 Right 8/13 Left 8/13  Hip flexion 31.1 32.0 39.8 40.0 32.6 ; 5/5 31.7 ; 5/5  Hip extension        Hip abduction 28.5 21.8 28.9 (s/l) 37.1 (s/l) 26.7 25.1  Hip adduction        Hip internal rotation        Hip external rotation        Knee flexion        Knee extension 29.7 35.1 37.0 32.0 (had cramping in leg)    Ankle dorsiflexion        Ankle plantarflexion        Ankle inversion        Ankle eversion         (Blank rows = not tested)  LUMBAR SPECIAL TESTS:  Slump test: Negative  FUNCTIONAL TESTS:  5 times sit to stand:  from bench 24.45 using  Timed up and go (TUG): 13.24  BERG Balance Test     Date: eval   eval 10/09/2023  Sit to Stand 2 4  Standing unsupported  4 4  Sitting with back unsupported but feet supported 4 4  Stand to sit  2 4  Transfers  3 4  Standing unsupported with eyes closed 3 4  Standing unsupported feet together 1 4  From standing position, reach forward with outstretched arm 3 3  From standing position, pick up object from floor 3 3  From standing position, turn and look behind over each shoulder 3 3  Turn 360 1 3  Standing unsupported, alternately place foot on step 1 4  Standing unsupported, one foot in front 1 1  Standing on one leg 0 1  Total:  28 46    GAIT: Assistive device utilized: None Level of assistance: Complete Independence Comments: shortened step length, decreased cadence, decreased bilat pelvic rotation bilat  [facility] Adult PT  Treatment:                           11/01/23 Pt seen for aquatic therapy today.  Treatment took place in water 3.5-4.75 ft in depth at the [facility] pool. Temp of water was 91.  Pt entered/exited the pool via stairs using step-to pattern with bilat hand rail.  *unsupported: walking forward/ backward  in 3.6 ft, 3 laps * side stepping with arm add/abdct with rainbow hand floats x 3 laps   * Straddling yellow noodle cycling across pool; hip abdct/add; cross country ski * tandem gait, hands out of water, forward/backward * single leg superman with UE on hand floats x 8 each LE (balance challenge!) *UE on rainbow hand floats: 3 way LE kick x 5 each LE * when dried off:  applied sensitive skin Rock tape to Rt knee at medial and lateral joint line with 25% stretch to decompress tissue and increase proprioception. Pt instructed in safe tape removal technique  10/30/23:  Reviewed pt presentation, current function, response to prior Rx, HEP compliance, and pain levels. Assessed gait and LE strength. Reviewed HEP.  Pt completed  modified oswestry disability index.  SLS:  R:  not tested, L:  4 sec  Supine bridge 2x10 Standing HR/TR 2x10 LTR with LE's on green ball x 10 bilat second holds Romberg on airex with CGA 2x30 seconds Modified tandem stance 2x30sec ea on floor SBA-min assist with occasional UE support on counter     Treatment:                           10/18/23 Pt seen for aquatic therapy today.  Treatment took place in water 3.5-4.75 ft in depth at the [facility] pool. Temp of water was 91.  Pt entered/exited the pool via stairs using step-to pattern with bilat hand rail.  *unsupported: walking forward/ backward  in 3.6 ft, 3 laps * suit case carry with bil rainbow hand float at side, walking backwards / forwards  * side stepping with arm add/abdct with rainbow hand floats x 3 laps   *UE on rainbow hand floats:  leg swings into hip flex/extension and hip abdct/ add 2 x 10 each, with varied speed * hollow noodle stomp with light UE support on wall x15 each LE * standing balance on hollow noodle with light touch on wall (good challenge, improved)-> mini squats -> slow marching * SLS with opp hand holding hollow noodle under water 2 x 15-20 sec each LE * Straddling yellow noodle cycling across pool * single leg superman with UE on yellow noodle x  8 each LE * standing quad stretch with ankle supported by hollow noodle x 20s   PATIENT EDUCATION:  Education details:  rationale of interventions,exercise form; safe tape removal and rationale of use. Person educated: Patient Education method: Explanation, demonstration, verbal cues Education comprehension: verbalized understanding, returned demonstration, verbal cues required  HOME EXERCISE PROGRAM: Land Access Code: [email or web address] URL: [email or web address]  Aquatic Access Code: [email or web address] URL: [email or web address] This aquatic home exercise program from [employer] utilizes pictures from land based exercises, but has been  adapted prior to lamination and issuance.  * issued 10/18/23  ASSESSMENT:  CLINICAL IMPRESSION: She responded well to Rx and had no complaints of pain after Rx. Avoided noodle stomp today and recommended she hold off on this at local pool. Trial today of application of kinesiology tape to medial/lateral Rt knee joint for increased proprioception.  Pt may benefit from 2 more visits to ensure independence with both aquatic and land based HEP and progression if needed.  OBJECTIVE IMPAIRMENTS: Abnormal gait, decreased activity tolerance, decreased balance, decreased endurance, decreased mobility, difficulty walking, decreased strength, postural dysfunction, and pain.   ACTIVITY LIMITATIONS: carrying, lifting, bending, standing, squatting, sleeping, stairs, transfers, locomotion level, and caring for others  PARTICIPATION LIMITATIONS: meal prep, cleaning, laundry, shopping, community activity, and yard work  PERSONAL FACTORS: Past/current experiences, Time since onset of injury/illness/exacerbation, and 1 comorbidity: see PmHx are also affecting patient's functional outcome.   REHAB POTENTIAL: Good  CLINICAL DECISION MAKING: Evolving/moderate complexity  EVALUATION COMPLEXITY: Moderate   GOALS: Goals reviewed with patient? Yes  SHORT TERM GOALS: Target date: 09/26/23  Pt will tolerate full aquatic sessions consistently without increase in pain and with improving function to demonstrate good toleration and effectiveness of intervention.  Baseline: Goal status:Met 09/19/23  2.  Pt will tolerate walking to and from setting and engaging in aquatic therapy session without excessive fatigue or increase in pain to demonstrate improved toleration to activity. Baseline: Increased Rt hip pain with gait Goal status: MET 10/09/2023  3.  Pt will perform tandem and SLS in 3.6 ft holding for up towards 15s to demonstrate improvements in balance ability Baseline: unable to complete on land Goal status:  In progress. 10/11/23    LONG TERM GOALS: Target date: 11/20/23  Pt to improve on ODI by 15% (MCID) to demonstrate statistically significant Improvement in function. Baseline: 14/50=28% Goal status: NOT MET  2.  Pt will be indep with final HEP's (land and aquatic as appropriate) for continued management of condition Baseline:  Goal status: MET 10/09/2023  3.  Pt will report decrease in pain by at least 50% for improved toleration to activity/quality of life and to demonstrate improved management of pain. Baseline:  Goal status: MET 1/10 10/09/2023  4.  Pt will report standing toleration up towards 30 minutes to prepare a meal or do dishes without limiting pain Baseline: 15 min Max Goal status: MET 10/09/2023  5.  Pt will improve on Berg balance test to >/= 43/56  (MDC 35/56) to demonstrate a decrease in fall risk. Baseline: 28/56 Goal status: MET 10/09/2023  6.  Pt will improve strength in hips by at least 10 lbs (HD) to demonstrate improved overall physical function Baseline: see chart Goal status: NOT MET  7. Pt will be able to perform SLS for >10 seconds to improve balance.  Baseline: see chart Goal status: ONGOING PLAN:  PT FREQUENCY: 1-2x/wk for 2-3 more visits  PT DURATION: probably 2 more weeks of PT though put cert out for 3 weeks due  to schedule.  PLANNED INTERVENTIONS: 97164- PT Re-evaluation, 97110-Therapeutic exercises, 97530- Therapeutic activity, [id number]- Neuromuscular re-education, [phone]- Self Care, [id number]- Manual therapy, [phone]- Gait training, [phone]- Aquatic Therapy, [phone]- Electrical stimulation (unattended), [phone]- Ionotophoresis 4mg /ml Dexamethasone, Patient/Family education, Balance training, Stair training, Taping, Joint mobilization, DME instructions, Cryotherapy, and Moist heat.  PLAN FOR NEXT SESSION: Aquatic and land based therapy.  Ensure independence with HEP.   Delon Aquas, PTA 11/01/23 11:41 AM [facility] [address] [place], [place], [address] Phone: [phone]   Fax:  [phone]

## 2023-11-12 ENCOUNTER — Ambulatory Visit (HOSPITAL_BASED_OUTPATIENT_CLINIC_OR_DEPARTMENT_OTHER): Admitting: Physical Therapy

## 2023-11-12 DIAGNOSIS — M5459 Other low back pain: Secondary | ICD-10-CM | POA: Diagnosis not present

## 2023-11-12 DIAGNOSIS — R2689 Other abnormalities of gait and mobility: Secondary | ICD-10-CM | POA: Diagnosis not present

## 2023-11-12 DIAGNOSIS — M6281 Muscle weakness (generalized): Secondary | ICD-10-CM

## 2023-11-12 DIAGNOSIS — R2681 Unsteadiness on feet: Secondary | ICD-10-CM

## 2023-11-12 NOTE — Therapy (Signed)
 OUTPATIENT PHYSICAL THERAPY THORACOLUMBAR TREATMENT   Patient Name: Brittany Oliver MRN: [id number] DOB:Jul 17, 1940, 83 y.o., female Today's Date: 11/13/2023  END OF SESSION:  PT End of Session - 11/12/23        Visit Number 16     Number of Visits 16     Date for PT Re-Evaluation 11/20/23     Authorization Type MCR     Progress Note Due on Visit     PT Start Time 1116     PT Stop Time 1152     PT Time Calculation (min) 36 min    Activity Tolerance Patient tolerated treatment well    Behavior During Therapy [facility] for tasks assessed/performed          Past Medical History:  Diagnosis Date   Arthritis    Cancer (HCC) 02/2013   skin melanoma   Heart murmur    Hyperlipidemia    Hypertension    Neuralgia facialis vera    Stroke (HCC) 1987   has a little balance issue since stroke   Past Surgical History:  Procedure Laterality Date   ABDOMINAL HYSTERECTOMY  1974   pt still has her uterus, took only 1 ovary and 1 tube   APPENDECTOMY     COLONOSCOPY     DILATION AND CURETTAGE OF UTERUS  1965   ESOPHAGOGASTRODUODENOSCOPY (EGD) WITH PROPOFOL  Left 01/23/2019   Procedure: ESOPHAGOGASTRODUODENOSCOPY (EGD) WITH PROPOFOL ;  Surgeon: Burnette Fallow, MD;  Location: [facility];  Service: Endoscopy;  Laterality: Left;   EXCISION MELANOMA WITH SENTINEL LYMPH NODE BIOPSY Left 03/27/2013   Procedure: EXCISION MELANOMA LEFT POSTERIOR ARM WITH LEFT AXILLARY SENTINEL LYMPH NODE BIOPSY;  Surgeon: Dann FORBES Hummer, MD;  Location: [facility] OR;  Service: General;  Laterality: Left;   EYE SURGERY Bilateral 2006   cataract and laser surgery   FINGER SURGERY Right    middle finger   MASS EXCISION Left 09/15/2013   Procedure: EXCISION MASS LEFT UPPER ARM;  Surgeon: Dann FORBES Hummer, MD;  Location: [facility] OR;  Service: General;  Laterality: Left;   TONSILLECTOMY     Patient Active Problem List   Diagnosis Date Noted   Secondary hypercoagulable state (HCC) 03/26/2019   Paroxysmal atrial fibrillation  (HCC)    GI bleed 01/22/2019   Hypertension    Hyperlipidemia    Neuralgia facialis vera    COPD mixed type (HCC) 03/02/2015   Dyspnea 12/23/2014   Ex-cigarette smoker 12/23/2014   Seroma complicating a procedure 09/24/2013   Mass of arm 08/26/2013   Atypical facial pain 07/13/2013   Melanoma of left upper arm (HCC) 03/23/2013   Stroke (HCC) 1987   Anemia due to acute blood loss 1987    PCP: Montie Pizza MD  REFERRING PROVIDER: Juliene Bailey MD  REFERRING DIAG: [phone] (ICD-10-CM) - Other spondylosis, lumbar region   Rationale for Evaluation and Treatment: Rehabilitation  THERAPY DIAG:  Other low back pain  Muscle weakness (generalized)  Other abnormalities of gait and mobility  Unsteadiness on feet  ONSET DATE: exacerbation 1 yr  SUBJECTIVE:  SUBJECTIVE STATEMENT: Pt reports the knee is doing much, much better.  Pt reports she has been performing her home exercises, not as much as she should.  Pt states she is performing supine lumbar rotation with bilat LE's on p-ball at home.  Pt states she has completed aquatic therapy and today is her last land visit. Overall the therapy has been good for my back.  Pt denies any adverse effects after prior Rx.   Pt states she is performing supine lumbar rotation with bilat LE's on p-ball at home.     POOL ACCESS: Niece has pool in yard, using ~2x/wk    PERTINENT HISTORY:  Convex left scoliosis centered at L3  A-fib COPD  PAIN:  Are you having pain? Yes: NPRS scale:  0/10 current Pain location: lumbar and R knee Pain description: ache Aggravating factors: standing 15 minutes Relieving factors: sitting  PRECAUTIONS: None  RED FLAGS: None   WEIGHT BEARING RESTRICTIONS: No  FALLS:  Has patient fallen in last 6 months? No  LIVING  ENVIRONMENT: Lives with: lives with their spouse Lives in: House/apartment Stairs: Yes: Internal: 16 steps; on right going up main floor with bed and bath 1st floor Has following equipment at home: None  OCCUPATION: retired  PLOF: Independent  PATIENT GOALS: improve my strength and my balance  NEXT MD VISIT: as needed  OBJECTIVE:  Note: Objective measures were completed at Evaluation unless otherwise noted.  DIAGNOSTIC FINDINGS:  MRI Lumbar 11/23 IMPRESSION: 1. Compared with previous MRI from 2019, no acute findings or clear explanation for the patient's symptoms. 2. Chronic multilevel spondylosis associated with a convex left scoliosis, similar to previous study from 2019. 3. Chronic foraminal narrowing on the right at L3-4, bilaterally at L4-5 and on the left at L5-S1. No high-grade spinal stenosis or acute nerve root encroachment.  PATIENT SURVEYS:  Modified Oswestry Initial/Current:  14/50=28% /  14/50= 28%   COGNITION: Overall cognitive status: Within functional limits for tasks assessed     SENSATION: WFL  MUSCLE LENGTH: Hamstrings: wfl tested in sitting   POSTURE: rounded shoulders, forward head, increased thoracic kyphosis, and left pelvic obliquity Convex left scoliosis centered at L3   PALPATION: Mild TTP upper lumbar and lower thoracic paraspinals  LUMBAR ROM:   AROM eval  Flexion FT to mid calf P!  Extension Limited 25%  Right lateral flexion full  Left lateral flexion full  Right rotation   Left rotation    (Blank rows = not tested)  LOWER EXTREMITY ROM:     wfl  LOWER EXTREMITY MMT:    MMT Right eval Left eval Right 7/30 Left  7/30 Right 8/13 Left 8/13  Hip flexion 31.1 32.0 39.8 40.0 32.6 ; 5/5 31.7 ; 5/5  Hip extension        Hip abduction 28.5 21.8 28.9 (s/l) 37.1 (s/l) 26.7 25.1  Hip adduction        Hip internal rotation        Hip external rotation        Knee flexion        Knee extension 29.7 35.1 37.0 32.0 (had  cramping in leg)    Ankle dorsiflexion        Ankle plantarflexion        Ankle inversion        Ankle eversion         (Blank rows = not tested)  LUMBAR SPECIAL TESTS:  Slump test: Negative  FUNCTIONAL TESTS:  5 times sit to  stand: from bench 24.45 using  Timed up and go (TUG): 13.24  BERG Balance Test     Date: eval   eval 10/09/2023  Sit to Stand 2 4  Standing unsupported 4 4  Sitting with back unsupported but feet supported 4 4  Stand to sit  2 4  Transfers  3 4  Standing unsupported with eyes closed 3 4  Standing unsupported feet together 1 4  From standing position, reach forward with outstretched arm 3 3  From standing position, pick up object from floor 3 3  From standing position, turn and look behind over each shoulder 3 3  Turn 360 1 3  Standing unsupported, alternately place foot on step 1 4  Standing unsupported, one foot in front 1 1  Standing on one leg 0 1  Total:  28 46    GAIT: Assistive device utilized: None Level of assistance: Complete Independence Comments: shortened step length, decreased cadence, decreased bilat pelvic rotation bilat  [facility]  11/12/23: Reviewed land based and aquatic HEP.  Updated land HEP with supine clams.   Supine clams with GTB x20, x10 Supine bridge 2x10 with GTB around LE's Sidestepping x 2 laps at rail  Romberg standing on airex 2x30 sec Half tandem stance approx 20 sec x  Pt unable to perform tandem stance without UE assist.    Treatment:                           11/01/23 Pt seen for aquatic therapy today.  Treatment took place in water 3.5-4.75 ft in depth at the [facility] pool. Temp of water was 91.  Pt entered/exited the pool via stairs using step-to pattern with bilat hand rail.  *unsupported: walking forward/ backward  in 3.6 ft, 3 laps * side stepping with arm add/abdct with rainbow hand floats x 3 laps   * Straddling yellow noodle cycling across pool; hip abdct/add; cross country ski * tandem  gait, hands out of water, forward/backward * single leg superman with UE on hand floats x 8 each LE (balance challenge!) *UE on rainbow hand floats: 3 way LE kick x 5 each LE * when dried off:  applied sensitive skin Rock tape to Rt knee at medial and lateral joint line with 25% stretch to decompress tissue and increase proprioception. Pt instructed in safe tape removal technique  10/30/23:  Reviewed pt presentation, current function, response to prior Rx, HEP compliance, and pain levels. Assessed gait and LE strength. Reviewed HEP.  Pt completed modified oswestry disability index.  SLS:  R:  not tested, L:  4 sec  Supine bridge 2x10 Standing HR/TR 2x10 LTR with LE's on green ball x 10 bilat second holds Romberg on airex with CGA 2x30 seconds Modified tandem stance 2x30sec ea on floor SBA-min assist with occasional UE support on counter      PATIENT EDUCATION:  Education details:  rationale of interventions,exercise form; POC/discharge planning.  PT answered questions.   Person educated: Patient Education method: Explanation, demonstration, verbal cues Education comprehension: verbalized understanding, returned demonstration, verbal cues required  HOME EXERCISE PROGRAM: Land Access Code: [email or web address] URL: [email or web address]  Aquatic Access Code: [email or web address] URL: [email or web address] This aquatic home exercise program from Med[employer] utilizes pictures from land based exercises, but has been adapted prior to lamination and issuance.  * issued 10/18/23  ASSESSMENT:  CLINICAL IMPRESSION: Pt has made great progress in PT stating her back is doing much better.  Pt reports  her knee has recently felt much better also.  She has completed aquatic therapy and has her aquatic handout.  PT reviewed land based and aquatic HEP today.  PT updated land based HEP and educated pt in correct form and appropriate frequency.  She is independent with land based HEP.  Pt has met  2/3 STG's and 4/7 LTG's.  Pt did not meet strength and balance goals.  PT educated pt concerning progress.  Pt is pleased with her progress and is ready for discharge.       OBJECTIVE IMPAIRMENTS: Abnormal gait, decreased activity tolerance, decreased balance, decreased endurance, decreased mobility, difficulty walking, decreased strength, postural dysfunction, and pain.   ACTIVITY LIMITATIONS: carrying, lifting, bending, standing, squatting, sleeping, stairs, transfers, locomotion level, and caring for others  PARTICIPATION LIMITATIONS: meal prep, cleaning, laundry, shopping, community activity, and yard work  PERSONAL FACTORS: Past/current experiences, Time since onset of injury/illness/exacerbation, and 1 comorbidity: see PmHx are also affecting patient's functional outcome.   REHAB POTENTIAL: Good  CLINICAL DECISION MAKING: Evolving/moderate complexity  EVALUATION COMPLEXITY: Moderate   GOALS: Goals reviewed with patient? Yes  SHORT TERM GOALS: Target date: 09/26/23  Pt will tolerate full aquatic sessions consistently without increase in pain and with improving function to demonstrate good toleration and effectiveness of intervention.  Baseline: Goal status:Met 09/19/23  2.  Pt will tolerate walking to and from setting and engaging in aquatic therapy session without excessive fatigue or increase in pain to demonstrate improved toleration to activity. Baseline: Increased Rt hip pain with gait Goal status: MET 10/09/2023  3.  Pt will perform tandem and SLS in 3.6 ft holding for up towards 15s to demonstrate improvements in balance ability Baseline: unable to complete on land Goal status: In progress. 10/11/23    LONG TERM GOALS: Target date: 11/20/23  Pt to improve on ODI by 15% (MCID) to demonstrate statistically significant Improvement in function. Baseline: 14/50=28% Goal status: NOT MET  2.  Pt will be indep with final HEP's (land and aquatic as appropriate) for continued  management of condition Baseline:  Goal status: MET 10/09/2023  3.  Pt will report decrease in pain by at least 50% for improved toleration to activity/quality of life and to demonstrate improved management of pain. Baseline:  Goal status: MET 1/10 10/09/2023  4.  Pt will report standing toleration up towards 30 minutes to prepare a meal or do dishes without limiting pain Baseline: 15 min Max Goal status: MET 10/09/2023  5.  Pt will improve on Berg balance test to >/= 43/56  (MDC 35/56) to demonstrate a decrease in fall risk. Baseline: 28/56 Goal status: MET 10/09/2023  6.  Pt will improve strength in hips by at least 10 lbs (HD) to demonstrate improved overall physical function Baseline: see chart Goal status: NOT MET  7. Pt will be able to perform SLS for >10 seconds to improve balance.  Baseline: see chart Goal status: NOT MET PLAN:   PLANNED INTERVENTIONS: 97164- PT Re-evaluation, 97110-Therapeutic exercises, 97530- Therapeutic activity, 97112- Neuromuscular re-education, 97535- Self Care, 02859- Manual therapy, [phone]- Gait training, [phone]- Aquatic Therapy, [phone]- Electrical stimulation (unattended), [phone]- Ionotophoresis 4mg /ml Dexamethasone, Patient/Family education, Balance training, Stair training, Taping, Joint mobilization, DME instructions, Cryotherapy, and Moist heat.  PLAN FOR NEXT SESSION: Pt to be discharged from skilled PT due to good functional progress and progress toward goals.  She is agreeable with discharge and independent with HEP's.  Pt will continue with land based and aquatic HEP.   PHYSICAL THERAPY DISCHARGE  SUMMARY  Visits from Start of Care: 16  Current functional level related to goals / functional outcomes: See above   Remaining deficits: See above   Education / Equipment: See above      Leigh Minerva III PT, DPT 11/14/23 7:40 AM  [facility] [address] [place], [place],  [address] Phone: [phone]   Fax:  [phone]

## 2023-11-13 ENCOUNTER — Encounter (HOSPITAL_BASED_OUTPATIENT_CLINIC_OR_DEPARTMENT_OTHER): Payer: Self-pay | Admitting: Physical Therapy

## 2023-11-18 ENCOUNTER — Other Ambulatory Visit: Payer: Self-pay | Admitting: Neurology

## 2023-11-28 ENCOUNTER — Ambulatory Visit (HOSPITAL_BASED_OUTPATIENT_CLINIC_OR_DEPARTMENT_OTHER): Admitting: Physical Therapy

## 2024-01-15 DIAGNOSIS — H40013 Open angle with borderline findings, low risk, bilateral: Secondary | ICD-10-CM | POA: Diagnosis not present

## 2024-01-15 DIAGNOSIS — M85851 Other specified disorders of bone density and structure, right thigh: Secondary | ICD-10-CM | POA: Diagnosis not present

## 2024-01-15 DIAGNOSIS — M85852 Other specified disorders of bone density and structure, left thigh: Secondary | ICD-10-CM | POA: Diagnosis not present

## 2024-01-15 DIAGNOSIS — Z961 Presence of intraocular lens: Secondary | ICD-10-CM | POA: Diagnosis not present

## 2024-01-15 DIAGNOSIS — H52203 Unspecified astigmatism, bilateral: Secondary | ICD-10-CM | POA: Diagnosis not present

## 2024-01-15 DIAGNOSIS — Z1231 Encounter for screening mammogram for malignant neoplasm of breast: Secondary | ICD-10-CM | POA: Diagnosis not present

## 2024-01-15 DIAGNOSIS — H353132 Nonexudative age-related macular degeneration, bilateral, intermediate dry stage: Secondary | ICD-10-CM | POA: Diagnosis not present

## 2024-01-15 DIAGNOSIS — H04123 Dry eye syndrome of bilateral lacrimal glands: Secondary | ICD-10-CM | POA: Diagnosis not present

## 2024-01-25 ENCOUNTER — Other Ambulatory Visit: Payer: Self-pay | Admitting: Neurology

## 2024-02-11 ENCOUNTER — Encounter (HOSPITAL_BASED_OUTPATIENT_CLINIC_OR_DEPARTMENT_OTHER): Payer: Self-pay

## 2024-02-11 ENCOUNTER — Other Ambulatory Visit: Payer: Self-pay

## 2024-02-11 ENCOUNTER — Emergency Department (HOSPITAL_BASED_OUTPATIENT_CLINIC_OR_DEPARTMENT_OTHER)
Admission: EM | Admit: 2024-02-11 | Discharge: 2024-02-11 | Disposition: A | Discharge status: Home / Self Care | Attending: Emergency Medicine | Admitting: Emergency Medicine

## 2024-02-11 ENCOUNTER — Emergency Department (HOSPITAL_BASED_OUTPATIENT_CLINIC_OR_DEPARTMENT_OTHER)

## 2024-02-11 DIAGNOSIS — R519 Headache, unspecified: Secondary | ICD-10-CM | POA: Diagnosis present

## 2024-02-11 DIAGNOSIS — Z79899 Other long term (current) drug therapy: Secondary | ICD-10-CM | POA: Insufficient documentation

## 2024-02-11 DIAGNOSIS — Z8669 Personal history of other diseases of the nervous system and sense organs: Secondary | ICD-10-CM | POA: Diagnosis not present

## 2024-02-11 DIAGNOSIS — I10 Essential (primary) hypertension: Secondary | ICD-10-CM | POA: Diagnosis not present

## 2024-02-11 DIAGNOSIS — R030 Elevated blood-pressure reading, without diagnosis of hypertension: Secondary | ICD-10-CM

## 2024-02-11 DIAGNOSIS — G500 Trigeminal neuralgia: Secondary | ICD-10-CM | POA: Diagnosis not present

## 2024-02-11 LAB — CBG MONITORING, ED: Glucose-Capillary: 120 mg/dL — ABNORMAL HIGH (ref 70–99)

## 2024-02-11 MED ORDER — TRAMADOL HCL 50 MG PO TABS: 50.0000 mg | ORAL_TABLET | Freq: Four times a day (QID) | ORAL | 0 refills | Status: DC | PRN

## 2024-02-11 MED ORDER — GABAPENTIN 300 MG PO CAPS
900.0000 mg | ORAL_CAPSULE | Freq: Once | ORAL | Status: AC
  Administered 2024-02-11: 900 mg via ORAL
  Filled 2024-02-11: qty 3

## 2024-02-11 NOTE — ED Notes (Signed)
 Patient transported to CT

## 2024-02-11 NOTE — ED Triage Notes (Signed)
 Pt states that yesterday she was having some pain on the right side of her head and pain is spreading to left temporal area. Pt has previous hx of stroke in 1987. Pt has abnormal gait at times, family states that her gait is worse today.

## 2024-02-11 NOTE — Discharge Instructions (Addendum)
 It was our pleasure to provide your ER care today - we hope that you feel better.  Drink plenty of fluids/stay well hydrated. Continue your meds. You may also take ultram  as need for pain - no driving when taking.   Follow up with neurologist in the next 1-2 weeks.  Also follow up closely with primary care doctor in one week - also have your blood pressure rechecked then as it is high today.   Return to ER if worse, new symptoms, fevers, new/severe pain, intractable pain, persistent vomiting, numbness/weakness, change in speech or vision, or other concern.

## 2024-02-11 NOTE — ED Provider Notes (Signed)
 [facility] Provider Note   CSN: [id number] Arrival date & time: 02/11/24  1013     Patient presents with: Headache   Brittany Oliver is a 83 y.o. female.   Pt indicates in past couple days has had bad headache. States gets a sudden sharp pain to different area of head/face, sometimes on left, sometimes on right, not always in same part of head or face. Lasts seconds per episode. No specific exacerbating or alleviating factors - not related to touching head/face, not related to temperature, not related to brushing teeth or eating or other specific activity. Hx of trigeminal neuralgia, unsure if similar, but states is due for her normal dose of gabapentin . Denies recent change in meds. No head trauma. No syncope. No associated change in speech or vision. No numbness or weakness or loss of normal functional ability. No eye pain, tearing or redness. No neck pain or stiffness.  No sinus pain/drainage or uri symptoms. No fever or chills.   The history is provided by the patient and a relative.  Headache Associated symptoms: no abdominal pain, no back pain, no eye pain, no fever, no nausea, no neck pain, no neck stiffness, no numbness, no sore throat, no vomiting and no weakness        Prior to Admission medications   Medication Sig Start Date End Date Taking? Authorizing Provider  traMADol  (ULTRAM ) 50 MG tablet Take 1 tablet (50 mg total) by mouth every 6 (six) hours as needed. 02/11/24  Yes Bernard Drivers, MD  Baclofen  5 MG TABS Take 2 tablets once daily 08/27/22   Skeet Juliene SAUNDERS, DO  Calcium Carbonate-Vitamin D (CALCIUM 600/VITAMIN D) 600-400 MG-UNIT chew tablet Chew 1 tablet by mouth daily.    [provider]  carvedilol  (COREG ) 3.125 MG tablet Take 1 tablet (3.125 mg total) by mouth 2 (two) times daily with a meal. 12/20/21   Wyn Jackee VEAR Mickey., NP  FLECTOR 1.3 % [facility] Place 1 patch onto the skin as needed.  11/23/14   [provider]  Fluticasone -Umeclidin-Vilant 100-62.5-25 MCG/INH AEPB Inhale into the lungs.    [provider]  furosemide  (LASIX ) 20 MG tablet Take one tablet by mouth for 3 days and as needed 12/20/21   Wyn Jackee VEAR Mickey., NP  gabapentin  (NEURONTIN ) 300 MG capsule TAKE 1 CAPSULE 3 TIMES A   DAY 01/27/24   Skeet, Adam R, DO  gabapentin  (NEURONTIN ) 600 MG tablet TAKE 1 TABLET 3 TIMES A DAY 11/20/23   Skeet, Adam R, DO  polyvinyl alcohol  (LIQUIFILM TEARS) 1.4 % ophthalmic solution Place 1 drop into both eyes as needed for dry eyes.    [provider]  pravastatin (PRAVACHOL) 80 MG tablet Take 80 mg by mouth daily.    [provider]  valsartan (DIOVAN) 160 MG tablet Take 160 mg by mouth daily. 07/13/21   [provider]  vitamin C (ASCORBIC ACID) 500 MG tablet Take 500 mg by mouth daily.    [provider]    Allergies: Trazodone and Trileptal  [oxcarbazepine ]    Review of Systems  Constitutional:  Negative for chills and fever.  HENT:  Negative for sinus pain and sore throat.   Eyes:  Negative for pain, redness and visual disturbance.  Respiratory:  Negative for shortness of breath.   Cardiovascular:  Negative for chest pain.  Gastrointestinal:  Negative for abdominal pain, nausea and vomiting.  Musculoskeletal:  Negative for back pain, neck pain and  neck stiffness.  Neurological:  Positive for headaches. Negative for speech difficulty, weakness and numbness.    Updated Vital Signs BP (!) 174/67   Pulse 79   Temp 98.4 F (36.9 C) (Oral)   Resp 18   Ht 1.575 m (5' 2)   Wt 65.2 kg   SpO2 94%   BMI 26.29 kg/m   Physical Exam Vitals and nursing note reviewed.  Constitutional:      Appearance: Normal appearance. She is well-developed.  HENT:     Head: Atraumatic.     Comments: No sinus or temporal tenderness.     Right Ear: Tympanic membrane normal.     Left Ear: Tympanic membrane normal.     Nose: Nose normal.     Mouth/Throat:      Mouth: Mucous membranes are moist.  Eyes:     General: No scleral icterus.    Extraocular Movements: Extraocular movements intact.     Conjunctiva/sclera: Conjunctivae normal.     Pupils: Pupils are equal, round, and reactive to light.  Neck:     Trachea: No tracheal deviation.     Comments: Supple, no stiffness or rigidity.  Cardiovascular:     Rate and Rhythm: Normal rate and regular rhythm.     Pulses: Normal pulses.     Heart sounds: Normal heart sounds. No murmur heard.    No friction rub. No gallop.  Pulmonary:     Effort: Pulmonary effort is normal. No respiratory distress.     Breath sounds: Normal breath sounds.  Abdominal:     General: There is no distension.     Palpations: Abdomen is soft.     Tenderness: There is no abdominal tenderness.  Musculoskeletal:        General: No swelling.     Cervical back: Normal range of motion and neck supple. No rigidity. No muscular tenderness.     Right lower leg: No edema.     Left lower leg: No edema.  Skin:    General: Skin is warm and dry.     Findings: No rash.  Neurological:     General: No focal deficit present.     Mental Status: She is alert and oriented to person, place, and time.     Cranial Nerves: No cranial nerve deficit.     Comments: Alert, speech normal. Motor/sens grossly intact bil.   Psychiatric:        Mood and Affect: Mood normal.     (all labs ordered are listed, but only abnormal results are displayed) Results for orders placed or performed during the hospital encounter of 02/11/24  CBG monitoring, ED   Collection Time: 02/11/24 10:27 AM  Result Value Ref Range   Glucose-Capillary 120 (H) 70 - 99 mg/dL      EKG: None  Radiology: CT Head Wo Contrast Result Date: 02/11/2024 EXAM: CT HEAD WITHOUT CONTRAST 02/11/2024 10:57:00 AM TECHNIQUE: CT of the head was performed without the administration of intravenous contrast. Automated exposure control, iterative reconstruction, and/or weight based  adjustment of the mA/kV was utilized to reduce the radiation dose to as low as reasonably achievable. COMPARISON: CT head 05/07/2013. CLINICAL HISTORY: Headache, increasing frequency or severity. FINDINGS: BRAIN AND VENTRICLES: No acute hemorrhage. No evidence of acute infarct. Overall similar mild generalized parenchymal volume loss. Overall similar mild scattered white matter hypodensities, which are nonspecific but most commonly represent chronic microvascular ischemic changes. Redemonstrated chronic-appearing left centrum semiovale infarct. No hydrocephalus. No suspicious extra-axial collection. No mass effect  or midline shift. ORBITS: No acute abnormality. SINUSES: Partial opacification of the right sphenoid sinus. SOFT TISSUES AND SKULL: No acute soft tissue abnormality. No skull fracture. IMPRESSION: 1. No acute intracranial hemorrhage or mass effect. 2. No substantial change since 05/07/2013. Electronically signed by: prentice spade 02/11/2024 11:29 AM EST RP Workstation: [id number]     Procedures   Medications Ordered in the ED  gabapentin  (NEURONTIN ) capsule 900 mg (900 mg Oral Given 02/11/24 1056)                                    Medical Decision Making Problems Addressed: Elevated blood pressure reading: acute illness or injury Essential hypertension: chronic illness or injury with exacerbation, progression, or side effects of treatment that poses a threat to life or bodily functions Facial pain: acute illness or injury with systemic symptoms History of trigeminal neuralgia: chronic illness or injury Intermittent headache: acute illness or injury with systemic symptoms  Amount and/or Complexity of Data Reviewed Independent Historian:     Details: Family, hx External Data Reviewed: notes. Labs: ordered. Decision-making details documented in ED Course. Radiology: ordered and independent interpretation performed. Decision-making details documented in ED  Course.  Risk Prescription drug management. Decision regarding hospitalization.   Labs ordered/sent. Imaging ordered.   Differential diagnosis includes migraine, sah, neuralgia, etc. Dispo decision including potential need for admission considered - will get labs and imaging and reassess.   Reviewed nursing notes and prior charts for additional history. External reports reviewed. Additional history from:  Labs reviewed/interpreted by me -  non fasting cbg, ok, normal, 120.   Pt indicates due for her gabapentin  dose - provided.   CT reviewed/interpreted by me - no hem.   Pt appears stable for ed d/c.   Rec close pcp/neurology f/u.  Return precautions provided.       Final diagnoses:  Intermittent headache  Facial pain  History of trigeminal neuralgia  Elevated blood pressure reading  Essential hypertension    ED Discharge Orders          Ordered    traMADol  (ULTRAM ) 50 MG tablet  Every 6 hours PRN        02/11/24 1231    Ambulatory referral to Neurology       Comments: An appointment is requested in approximately: 1 week   02/11/24 1232               Bernard Drivers, MD 02/11/24 1234

## 2024-02-12 ENCOUNTER — Ambulatory Visit: Admitting: Neurology

## 2024-02-12 ENCOUNTER — Encounter: Payer: Self-pay | Admitting: Neurology

## 2024-02-12 VITALS — BP 106/65 | HR 81 | Ht 61.0 in | Wt 137.0 lb

## 2024-02-12 DIAGNOSIS — G500 Trigeminal neuralgia: Secondary | ICD-10-CM | POA: Diagnosis not present

## 2024-02-12 DIAGNOSIS — R519 Headache, unspecified: Secondary | ICD-10-CM | POA: Diagnosis not present

## 2024-02-12 MED ORDER — BACLOFEN 5 MG PO TABS: 5.0000 | ORAL_TABLET | Freq: Three times a day (TID) | ORAL | 5 refills | Status: DC

## 2024-02-12 MED ORDER — TRAMADOL HCL 50 MG PO TABS: ORAL_TABLET | ORAL | 2 refills | Status: DC

## 2024-02-12 NOTE — Patient Instructions (Signed)
 Continue gabapentin  total 900mg  three times aily Start baclofen  5mg  three times daily Take tramadol  1 to 2 tablets up to 3 times daily as needed for breakthrough pain Contact me in 1 week with update Follow up 3 months.

## 2024-02-12 NOTE — Progress Notes (Signed)
 NEUROLOGY FOLLOW UP OFFICE NOTE  NABA SNEED [id number]  Assessment/Plan:   Left trigeminal neuralgia involving V1 distribution, now typical presentation of paroxysmal pain     Continue gabapentin  900mg  three times daily (takes 300mg  with 600mg  capsule with each dose).  Start Baclofen  to 5mg  three times daily round the clock for now Continue tramadol  50-100mg  up to 3 times daily as needed for breakthrough pain Follow up in 3 months.  Subjective:  Karema Greenspan is a 83 year old right-handed woman with hypertension, hypercholesterolemia, and history of posterior circulation stroke who follows up for atypical left-sided facial pain.  She is accompanied by her husband who supplements history.  Discussed the use of AI scribe software for clinical note transcription with the patient, who gave verbal consent to proceed.  History of Present Illness UPDATE:  Current medication: Gabapentin  900 mg (300mg  cap + 600mg  cap) 3 times daily; baclofen  5mg  TID PRN (for breakthrough pain), tramadol  50mg   Her symptoms began the night before last with a sharp pain behind her right ear. By the following morning, the pain had shifted to the left side of her head, specifically in the temple area, which is where she typically experiences neuralgia pain. She describes this episode as different from her usual pain, noting it as a severe shooting pain, rating it a ten out of ten in intensity.  She was seen in the ED where CT head showed no acute intracranial abnormality.  The pain is severe and paroxysmal, and did not travel to her face and was localized to the temple area. It worsened when she was in the car on the way to the ER, although she is unsure of any specific triggers. She was prescribed tramadol  in the ED.  She continues to experience occasional light shooting pains but not as severe as the previous day.     HISTORY:  She began experiencing left-sided facial pain and 2006.  She reports  a fairly constant burning and pins and needles sensation in the left V1 and V2 distribution.  Light touch, cold and wind can trigger a shooting pain down the face, but there is no spontaneous paroxysmal shooting pain. Eating and brushing her teeth does not aggravate it.  She says heat seems to help relieve it.  She was eventually diagnosed with left trigeminal neuralgia in 2012.   Past medications:  Trileptal  (caused hyponatremia) Other therapy:  Gamma Knife x2 (ineffective).   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   She does have a history of brainstem stroke in 1987, in which she presented with left oculomotor weakness, left rotatory nystagmus, left facial numbness and dysfunction of her right arm and leg.  She still has residual numbness of the left face.   In 2018, she endorsed balance problems and exhibited some symptoms of neuropathy in the feet.  She underwent work-up for neuropathy.  ANA, sed rate, B12, B6, TSH, SPEP and IFE were unremarkable.  She was referred to physical therapy to help with balance.  It was minimally effective but may have not been as proactive in home exercises.  She has to think before turning around too fast.  No associated dizziness.  PAST MEDICAL HISTORY: Past Medical History:  Diagnosis Date   Arthritis    Cancer (HCC) 02/2013   skin melanoma   Heart murmur    Hyperlipidemia  Hypertension    Neuralgia facialis vera    Stroke [facility]) 1987   has a little balance issue since stroke    MEDICATIONS: Current Outpatient Medications on File Prior to Visit  Medication Sig Dispense Refill   Baclofen  5 MG TABS Take 2 tablets once daily 180 tablet 3   Calcium Carbonate-Vitamin D (CALCIUM 600/VITAMIN D) 600-400 MG-UNIT chew tablet Chew 1 tablet by mouth daily.     carvedilol  (COREG ) 3.125 MG  tablet Take 1 tablet (3.125 mg total) by mouth 2 (two) times daily with a meal. 180 tablet 3   FLECTOR 1.3 % PTCH Place 1 patch onto the skin as needed.      Fluticasone -Umeclidin-Vilant 100-62.5-25 MCG/INH AEPB Inhale into the lungs.     furosemide  (LASIX ) 20 MG tablet Take one tablet by mouth for 3 days and as needed 30 tablet 3   gabapentin  (NEURONTIN ) 300 MG capsule TAKE 1 CAPSULE 3 TIMES A   DAY 270 capsule 0   gabapentin  (NEURONTIN ) 600 MG tablet TAKE 1 TABLET 3 TIMES A DAY 270 tablet 0   polyvinyl alcohol  (LIQUIFILM TEARS) 1.4 % ophthalmic solution Place 1 drop into both eyes as needed for dry eyes.     pravastatin (PRAVACHOL) 80 MG tablet Take 80 mg by mouth daily.     traMADol  (ULTRAM ) 50 MG tablet Take 1 tablet (50 mg total) by mouth every 6 (six) hours as needed. 15 tablet 0   valsartan (DIOVAN) 160 MG tablet Take 160 mg by mouth daily.     vitamin C (ASCORBIC ACID) 500 MG tablet Take 500 mg by mouth daily.     No current facility-administered medications on file prior to visit.    ALLERGIES: Allergies  Allergen Reactions   Trazodone    Trileptal  [Oxcarbazepine ] Other (See Comments)    Low potassium    FAMILY HISTORY: Family History  Adopted: Yes  Problem Relation Age of Onset   Ataxia Neg Hx    Chorea Neg Hx    Dementia Neg Hx    Mental retardation Neg Hx    Migraines Neg Hx    Multiple sclerosis Neg Hx    Neurofibromatosis Neg Hx    Neuropathy Neg Hx    Parkinsonism Neg Hx    Seizures Neg Hx    Stroke Neg Hx       Objective:  Blood pressure (!) 143/110, pulse 93, height 5' 2 (1.575 m), weight 140 lb (63.5 kg), SpO2 92%. General: No acute distress.  Patient appears well-groomed.   Head:  Normocephalic/atraumatic Eyes:  Fundi examined but not visualized Neck: supple, no paraspinal tenderness, full range of motion Heart:  Regular rate and rhythm Neurological Exam: alert and oriented.  Speech fluent and not dysarthric, language intact.  CN II-XII intact. Bulk  and tone normal, muscle strength 5/5 throughout.  Sensation to light touch intact.  Deep tendon reflexes 1+ throughout.  Finger to nose testing intact.  Wide-based gait.   Romberg with sway.   Juliene Dunnings, DO  CC: Montie Pizza, MD

## 2024-02-16 ENCOUNTER — Other Ambulatory Visit: Payer: Self-pay | Admitting: Neurology

## 2024-02-24 ENCOUNTER — Encounter: Payer: Self-pay | Admitting: Neurology

## 2024-02-24 ENCOUNTER — Other Ambulatory Visit: Payer: Self-pay | Admitting: Neurology

## 2024-02-24 MED ORDER — TRAMADOL HCL 50 MG PO TABS: ORAL_TABLET | ORAL | 1 refills | Status: AC

## 2024-02-24 MED ORDER — BACLOFEN 5 MG PO TABS: 5.0000 | ORAL_TABLET | Freq: Three times a day (TID) | ORAL | 5 refills | Status: DC

## 2024-03-04 DIAGNOSIS — E538 Deficiency of other specified B group vitamins: Secondary | ICD-10-CM | POA: Diagnosis not present

## 2024-03-04 DIAGNOSIS — R7303 Prediabetes: Secondary | ICD-10-CM | POA: Diagnosis not present

## 2024-04-06 ENCOUNTER — Ambulatory Visit: Payer: Medicare Other | Admitting: Neurology

## 2024-06-15 ENCOUNTER — Ambulatory Visit: Admitting: Neurology
